# Patient Record
Sex: Male | Born: 1960 | Race: White | Hispanic: No | Marital: Married | Smoking: Former smoker
Health system: Southern US, Community
[De-identification: ages and names within clinical notes are randomized; demographics above are authoritative.]

## PROBLEM LIST (undated history)

## (undated) DIAGNOSIS — E7800 Pure hypercholesterolemia, unspecified: Secondary | ICD-10-CM

## (undated) HISTORY — PX: NO PAST SURGERIES: SHX2092

---

## 2013-05-21 ENCOUNTER — Ambulatory Visit: Payer: Self-pay | Admitting: Physician Assistant

## 2013-05-21 LAB — RAPID INFLUENZA A&B ANTIGENS

## 2013-07-19 LAB — LIPID PANEL
Cholesterol: 249 mg/dL — AB (ref 0–200)
HDL: 41 mg/dL (ref 35–70)
LDL Cholesterol: 141 mg/dL
Triglycerides: 334 mg/dL — AB (ref 40–160)

## 2013-07-19 LAB — HEPATIC FUNCTION PANEL
ALT: 33 U/L (ref 10–40)
AST: 22 U/L (ref 14–40)

## 2013-07-19 LAB — PSA: PSA: 0.9

## 2013-07-19 LAB — CBC AND DIFFERENTIAL: HEMOGLOBIN: 15.4 g/dL (ref 13.5–17.5)

## 2013-07-19 LAB — BASIC METABOLIC PANEL
BUN: 16 mg/dL (ref 4–21)
Creatinine: 0.8 mg/dL (ref 0.6–1.3)

## 2013-10-02 LAB — HM COLONOSCOPY: HM Colonoscopy: 5

## 2015-02-19 ENCOUNTER — Encounter: Payer: Self-pay | Admitting: Internal Medicine

## 2015-02-19 ENCOUNTER — Other Ambulatory Visit: Payer: Self-pay | Admitting: Internal Medicine

## 2015-02-19 DIAGNOSIS — Z8379 Family history of other diseases of the digestive system: Secondary | ICD-10-CM | POA: Insufficient documentation

## 2015-02-19 DIAGNOSIS — F3341 Major depressive disorder, recurrent, in partial remission: Secondary | ICD-10-CM | POA: Insufficient documentation

## 2015-02-19 DIAGNOSIS — F17201 Nicotine dependence, unspecified, in remission: Secondary | ICD-10-CM | POA: Insufficient documentation

## 2015-03-10 ENCOUNTER — Ambulatory Visit (INDEPENDENT_AMBULATORY_CARE_PROVIDER_SITE_OTHER): Payer: BLUE CROSS/BLUE SHIELD | Admitting: Internal Medicine

## 2015-03-10 ENCOUNTER — Encounter: Payer: Self-pay | Admitting: Internal Medicine

## 2015-03-10 VITALS — BP 136/90 | HR 80 | Ht 71.0 in | Wt 313.8 lb

## 2015-03-10 DIAGNOSIS — E785 Hyperlipidemia, unspecified: Secondary | ICD-10-CM

## 2015-03-10 DIAGNOSIS — R030 Elevated blood-pressure reading, without diagnosis of hypertension: Secondary | ICD-10-CM

## 2015-03-10 DIAGNOSIS — G4733 Obstructive sleep apnea (adult) (pediatric): Secondary | ICD-10-CM | POA: Insufficient documentation

## 2015-03-10 DIAGNOSIS — F3341 Major depressive disorder, recurrent, in partial remission: Secondary | ICD-10-CM

## 2015-03-10 DIAGNOSIS — R635 Abnormal weight gain: Secondary | ICD-10-CM | POA: Diagnosis not present

## 2015-03-10 DIAGNOSIS — K219 Gastro-esophageal reflux disease without esophagitis: Secondary | ICD-10-CM

## 2015-03-10 DIAGNOSIS — T50905A Adverse effect of unspecified drugs, medicaments and biological substances, initial encounter: Secondary | ICD-10-CM

## 2015-03-10 NOTE — Progress Notes (Signed)
Date:  03/10/2015   Name:  Anthony Cohen   DOB:  [birth year]   MRN:  [id number]   Chief Complaint: Depression and Gastroesophageal Reflux Depression        This is a chronic problem.The problem is unchanged.  Past treatments include SSRIs - Selective serotonin reuptake inhibitors (abilify and bupropion).  Compliance with treatment is good.  Past compliance problems: followed by psychiatry.  Previous treatment provided significant relief. Gastroesophageal Reflux He complains of heartburn. He reports no abdominal pain, no chest pain, no hoarse voice, no nausea or no wheezing. This is a recurrent problem. The problem occurs occasionally. The problem has been waxing and waning. He has tried a histamine-2 antagonist for the symptoms. The treatment provided significant relief.  OSA - recent sleep study showed AHI 107 with severe snoring.  He was started on CPAP but removes the mask after only a few minutes.  He is wondering if he is a candidate for surgery to improve his apnea. Obesity - he continues to gain weight.  Last weight one year ago was 197 lbs.  Today he is up 16 lbs to 313 lbs.  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 He has labs needed for Psychiatry due to chronic medication use.  Several of these cause weight gain and have been linked to DM.    Review of Systems  Constitutional: Positive for unexpected weight change. Negative for chills and diaphoresis.  HENT: Negative for hearing loss and hoarse voice.   Eyes: Negative for visual disturbance.  Respiratory: Negative for chest tightness, shortness of breath and wheezing.   Cardiovascular: Negative for chest pain, palpitations and leg swelling.  Gastrointestinal: Positive for heartburn. Negative for nausea and abdominal pain.  Musculoskeletal: Positive for back pain. Negative for arthralgias and gait problem.  Psychiatric/Behavioral: Positive for depression.    Patient Active Problem List   Diagnosis Date Noted  .  Recurrent major depressive disorder, in partial remission ([place]) 02/19/2015  . Family history of digestive disorder 02/19/2015  . Compulsive tobacco user syndrome 02/19/2015    Prior to Admission medications   Medication Sig Start Date End Date Taking? Authorizing Provider  ARIPiprazole (ABILIFY) 10 MG tablet Take 1 tablet by mouth daily.   Yes Historical Provider, MD  buPROPion (WELLBUTRIN XL) 150 MG 24 hr tablet Take 3 tablets by mouth daily.   Yes Historical Provider, MD  famotidine (PEPCID) 10 MG tablet Take 1 tablet by mouth daily.   Yes Historical Provider, MD  sertraline (ZOLOFT) 100 MG tablet Take 1 tablet by mouth daily.   Yes Historical Provider, MD    No Known Allergies  No past surgical history on file.  Social History  Substance Use Topics  . Smoking status: Current Every Day Smoker  . Smokeless tobacco: None  . Alcohol Use: 1.2 oz/week    2 Standard drinks or equivalent per week    Medication list has been reviewed and updated.  Physical Exam  Constitutional: He is oriented to person, place, and time. He appears well-developed and well-nourished.  Neck: Normal range of motion. Neck supple. Carotid bruit is not present. No thyromegaly present.  Cardiovascular: Normal rate, regular rhythm and normal heart sounds.   Pulmonary/Chest: Effort normal and breath sounds normal. No respiratory distress. He has no wheezes.  Musculoskeletal: He exhibits no edema.  Neurological: He is alert and oriented to person, place, and time.  Psychiatric: He has a normal mood and affect. His behavior is normal.  Nursing note and vitals reviewed.   BP 142/80 mmHg  Pulse 80  Ht 5\' 11"  (1.803 m)  Wt 313 lb 12.8 oz (142.339 kg)  BMI 43.79 kg/m2  Assessment and Plan: 1. Gastroesophageal reflux disease, esophagitis presence not specified Continue pepcid - CBC with Differential/Platelet  2. Recurrent major depressive disorder, in partial remission ([place]) Followed by Psychiatry -  TSH  3. OSA (obstructive sleep apnea) Refer to ENT for evaluation of possible surgical intervention - EKG 12-Lead - SR @ 88 with nonspecific Twave abnormality  4. Weight gain due to medication Recommend dietary changes and regular exercise for weight loss  - Comprehensive metabolic panel - Hemoglobin A1c  5. Hyperlipidemia May need medication; will advise after labs return - Lipid panel  6. Elevated blood pressure reading Will monitor -    Halina Maidens, MD [facility]  03/10/2015

## 2015-03-11 LAB — CBC WITH DIFFERENTIAL/PLATELET
BASOS ABS: 0 10*3/uL (ref 0.0–0.2)
Basos: 0 %
EOS (ABSOLUTE): 0.2 10*3/uL (ref 0.0–0.4)
Eos: 2 %
HEMOGLOBIN: 15.7 g/dL (ref 12.6–17.7)
Hematocrit: 47.4 % (ref 37.5–51.0)
Immature Grans (Abs): 0 10*3/uL (ref 0.0–0.1)
Immature Granulocytes: 0 %
LYMPHS: 29 %
Lymphocytes Absolute: 2.1 10*3/uL (ref 0.7–3.1)
MCH: 31 pg (ref 26.6–33.0)
MCHC: 33.1 g/dL (ref 31.5–35.7)
MCV: 94 fL (ref 79–97)
MONOCYTES: 7 %
Monocytes Absolute: 0.5 10*3/uL (ref 0.1–0.9)
NEUTROS ABS: 4.4 10*3/uL (ref 1.4–7.0)
NEUTROS PCT: 62 %
PLATELETS: 229 10*3/uL (ref 150–379)
RBC: 5.07 x10E6/uL (ref 4.14–5.80)
RDW: 14.1 % (ref 12.3–15.4)
WBC: 7.3 10*3/uL (ref 3.4–10.8)

## 2015-03-11 LAB — LIPID PANEL
CHOLESTEROL TOTAL: 272 mg/dL — AB (ref 100–199)
Chol/HDL Ratio: 9.1 ratio units — ABNORMAL HIGH (ref 0.0–5.0)
HDL: 30 mg/dL — AB (ref >39)
Triglycerides: 672 mg/dL (ref 0–149)

## 2015-03-11 LAB — COMPREHENSIVE METABOLIC PANEL
ALBUMIN: 4.6 g/dL (ref 3.5–5.5)
ALK PHOS: 66 IU/L (ref 39–117)
ALT: 38 IU/L (ref 0–44)
AST: 22 IU/L (ref 0–40)
Albumin/Globulin Ratio: 1.8 (ref 1.1–2.5)
BILIRUBIN TOTAL: 0.2 mg/dL (ref 0.0–1.2)
BUN/Creatinine Ratio: 20 (ref 9–20)
BUN: 17 mg/dL (ref 6–24)
CHLORIDE: 104 mmol/L (ref 96–106)
CO2: 24 mmol/L (ref 18–29)
CREATININE: 0.85 mg/dL (ref 0.76–1.27)
Calcium: 9.4 mg/dL (ref 8.7–10.2)
GFR calc non Af Amer: 99 mL/min/{1.73_m2} (ref >59)
GFR, EST AFRICAN AMERICAN: 114 mL/min/{1.73_m2} (ref >59)
GLUCOSE: 93 mg/dL (ref 65–99)
Globulin, Total: 2.6 g/dL (ref 1.5–4.5)
Potassium: 4.5 mmol/L (ref 3.5–5.2)
Sodium: 146 mmol/L — ABNORMAL HIGH (ref 134–144)
TOTAL PROTEIN: 7.2 g/dL (ref 6.0–8.5)

## 2015-03-11 LAB — HEMOGLOBIN A1C
Est. average glucose Bld gHb Est-mCnc: 143 mg/dL
Hgb A1c MFr Bld: 6.6 % — ABNORMAL HIGH (ref 4.8–5.6)

## 2015-03-11 LAB — TSH: TSH: 2.24 u[IU]/mL (ref 0.450–4.500)

## 2015-04-14 ENCOUNTER — Encounter: Payer: Self-pay | Admitting: Internal Medicine

## 2015-04-14 ENCOUNTER — Ambulatory Visit (INDEPENDENT_AMBULATORY_CARE_PROVIDER_SITE_OTHER): Payer: BLUE CROSS/BLUE SHIELD | Admitting: Internal Medicine

## 2015-04-14 VITALS — BP 122/82 | HR 112 | Temp 98.7°F | Resp 18 | Ht 71.0 in | Wt 306.0 lb

## 2015-04-14 DIAGNOSIS — J111 Influenza due to unidentified influenza virus with other respiratory manifestations: Secondary | ICD-10-CM | POA: Diagnosis not present

## 2015-04-14 LAB — POCT INFLUENZA A/B
Influenza A, POC: NEGATIVE
Influenza B, POC: NEGATIVE

## 2015-04-14 MED ORDER — OSELTAMIVIR PHOSPHATE 75 MG PO CAPS: 75.0000 mg | ORAL_CAPSULE | Freq: Two times a day (BID) | ORAL | Status: DC

## 2015-04-14 NOTE — Progress Notes (Signed)
Date:  04/14/2015   Name:  Anthony Cohen   DOB:  Oct 22, 1960   MRN:  [id number]   Chief Complaint: Cough Cough This is a chronic problem. The current episode started yesterday. The problem has been unchanged. The problem occurs every few minutes. The cough is non-productive. Associated symptoms include chills, a fever, headaches, myalgias, rhinorrhea and sweats. Pertinent negatives include no chest pain, ear pain, postnasal drip, rash, shortness of breath or wheezing. Nothing aggravates the symptoms. Treatments tried: advil.  His chills and body aches responded to ibuprofen. He did not have a flu shot this. 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  Review of Systems  Constitutional: Positive for fever and chills.  HENT: Positive for rhinorrhea. Negative for ear pain, postnasal drip and sinus pressure.   Eyes: Negative for visual disturbance.  Respiratory: Positive for cough. Negative for chest tightness, shortness of breath and wheezing.   Cardiovascular: Negative for chest pain and palpitations.  Gastrointestinal: Negative for nausea, vomiting and abdominal pain.  Musculoskeletal: Positive for myalgias.  Skin: Negative for rash.  Neurological: Positive for headaches. Negative for dizziness, syncope and numbness.    Patient Active Problem List   Diagnosis Date Noted  . OSA (obstructive sleep apnea) 03/10/2015  . Weight gain due to medication 03/10/2015  . Hyperlipidemia 03/10/2015  . Recurrent major depressive disorder, in partial remission ([place]) 02/19/2015  . Family history of digestive disorder 02/19/2015  . Compulsive tobacco user syndrome 02/19/2015    Prior to Admission medications   Medication Sig Start Date End Date Taking? Authorizing Provider  ARIPiprazole (ABILIFY) 10 MG tablet Take 1 tablet by mouth daily. 02/27/15  Yes Historical Provider, MD  buPROPion (WELLBUTRIN XL) 150 MG 24 hr tablet Take 3 tablets by mouth daily. 02/27/15  Yes Historical  Provider, MD  DULoxetine (CYMBALTA) 60 MG capsule Take 1 capsule by mouth daily. 04/11/15  Yes Historical Provider, MD    No Known Allergies  Past Surgical History  Procedure Laterality Date  . No past surgeries      Social History  Substance Use Topics  . Smoking status: Former Smoker    Start date: 03/29/2015  . Smokeless tobacco: None  . Alcohol Use: 0.0 oz/week    0 Standard drinks or equivalent per week     Medication list has been reviewed and updated.   Physical Exam  Constitutional: He is oriented to person, place, and time. He appears well-developed and well-nourished. He has a sickly appearance.  Neck: Normal range of motion. Neck supple.  Cardiovascular: Regular rhythm, normal heart sounds and normal pulses.   No extrasystoles are present. Tachycardia present.   Pulmonary/Chest: Effort normal and breath sounds normal. He has no wheezes. He has no rales.  Lymphadenopathy:    He has no cervical adenopathy.  Neurological: He is alert and oriented to person, place, and time.  Skin: Skin is warm. He is diaphoretic.  Nursing note and vitals reviewed.   BP 122/82 mmHg  Pulse 112  Temp(Src) 98.7 F (37.1 C) (Oral)  Resp 18  Ht 5\' 11"  (1.803 m)  Wt 306 lb (138.801 kg)  BMI 42.70 kg/m2  SpO2 98%  Assessment and Plan: 1. Influenza Continue Advil 400-600 mg tid Fluids; remain out of work until 04/07/15 or until fever resolved - oseltamivir (TAMIFLU) 75 MG capsule; Take 1 capsule (75 mg total) by mouth 2 (two) times daily.  Dispense: 10 capsule; Refill: 0 - POCT Influenza A/B   Halina Maidens,  MD [facility]  04/14/2015

## 2015-06-09 ENCOUNTER — Ambulatory Visit: Payer: BLUE CROSS/BLUE SHIELD | Admitting: Internal Medicine

## 2015-06-16 ENCOUNTER — Ambulatory Visit (INDEPENDENT_AMBULATORY_CARE_PROVIDER_SITE_OTHER): Payer: BLUE CROSS/BLUE SHIELD | Admitting: Internal Medicine

## 2015-06-16 ENCOUNTER — Encounter: Payer: Self-pay | Admitting: Internal Medicine

## 2015-06-16 VITALS — BP 122/80 | HR 80 | Ht 71.0 in | Wt 311.6 lb

## 2015-06-16 DIAGNOSIS — F17201 Nicotine dependence, unspecified, in remission: Secondary | ICD-10-CM

## 2015-06-16 DIAGNOSIS — E119 Type 2 diabetes mellitus without complications: Secondary | ICD-10-CM | POA: Diagnosis not present

## 2015-06-16 DIAGNOSIS — E1165 Type 2 diabetes mellitus with hyperglycemia: Secondary | ICD-10-CM

## 2015-06-16 DIAGNOSIS — R7303 Prediabetes: Secondary | ICD-10-CM | POA: Insufficient documentation

## 2015-06-16 DIAGNOSIS — K219 Gastro-esophageal reflux disease without esophagitis: Secondary | ICD-10-CM | POA: Diagnosis not present

## 2015-06-16 DIAGNOSIS — E118 Type 2 diabetes mellitus with unspecified complications: Secondary | ICD-10-CM | POA: Insufficient documentation

## 2015-06-16 DIAGNOSIS — E785 Hyperlipidemia, unspecified: Secondary | ICD-10-CM

## 2015-06-16 DIAGNOSIS — E1169 Type 2 diabetes mellitus with other specified complication: Secondary | ICD-10-CM | POA: Insufficient documentation

## 2015-06-16 DIAGNOSIS — E782 Mixed hyperlipidemia: Secondary | ICD-10-CM | POA: Insufficient documentation

## 2015-06-16 MED ORDER — ICOSAPENT ETHYL 1 G PO CAPS: 2.0000 | ORAL_CAPSULE | Freq: Two times a day (BID) | ORAL | Status: DC

## 2015-06-16 NOTE — Progress Notes (Signed)
Date:  06/16/2015   Name:  Anthony Cohen   DOB:  [birth year]   MRN:  [id number]   Chief Complaint: Follow-up; Gastroesophageal Reflux; Diabetes; and Hyperlipidemia Gastroesophageal Reflux He complains of heartburn. He reports no abdominal pain, no chest pain, no coughing, no hoarse voice, no sore throat, no water brash or no wheezing. This is a recurrent problem. The problem occurs occasionally. The problem has been rapidly improving. The heartburn duration is several minutes. Pertinent negatives include no fatigue. He has tried a histamine-2 antagonist for the symptoms.  Diabetes He presents for his initial diabetic visit. He has type 2 diabetes mellitus. The initial diagnosis of diabetes was made 4 months ago. Pertinent negatives for hypoglycemia include no dizziness or headaches. Pertinent negatives for diabetes include no blurred vision, no chest pain, no fatigue, no foot paresthesias, no foot ulcerations and no visual change. Diabetic symptom progression: his A1C 6.6 - he has avoided sweets but eats other carbs.    Hyperlipidemia This is a chronic problem. Recent lipid tests were reviewed and are high. Pertinent negatives include no chest pain. He is currently on no antihyperlipidemic treatment (he had elevated LFTs on lipitor about 10 yr ago).  Tobacco use - he recently quit smoking using nicotine patches.  He is very proud of his quitting but he has been snacking more and gained a few pounds.  Lab Results  Component Value Date   HGBA1C 6.6* 03/10/2015   Lab Results  Component Value Date   CHOL 272* 03/10/2015   HDL 30* 03/10/2015   LDLCALC Comment 03/10/2015   TRIG 672* 03/10/2015   CHOLHDL 9.1* 03/10/2015     Review of Systems  Constitutional: Negative for chills and fatigue.  HENT: Negative for hearing loss, hoarse voice and sore throat.   Eyes: Negative for blurred vision and visual disturbance.  Respiratory: Negative for cough, chest tightness and wheezing.     Cardiovascular: Negative for chest pain, palpitations and leg swelling.  Gastrointestinal: Positive for heartburn. Negative for abdominal pain.  Musculoskeletal: Negative for arthralgias.  Neurological: Negative for dizziness, syncope and headaches.    Patient Active Problem List   Diagnosis Date Noted  . Diabetes mellitus type 2, controlled ([place]) 06/16/2015  . OSA (obstructive sleep apnea) 03/10/2015  . Weight gain due to medication 03/10/2015  . Hyperlipidemia 03/10/2015  . Recurrent major depressive disorder, in partial remission ([place]) 02/19/2015  . Family history of digestive disorder 02/19/2015  . Compulsive tobacco user syndrome 02/19/2015    Prior to Admission medications   Medication Sig Start Date End Date Taking? Authorizing Provider  ARIPiprazole (ABILIFY) 10 MG tablet Take 1 tablet by mouth daily. 02/27/15  Yes Historical Provider, MD  buPROPion (WELLBUTRIN XL) 150 MG 24 hr tablet Take 3 tablets by mouth daily. 02/27/15  Yes Historical Provider, MD  DULoxetine (CYMBALTA) 60 MG capsule Take 1 capsule by mouth daily. 04/11/15  Yes Historical Provider, MD    No Known Allergies  Past Surgical History  Procedure Laterality Date  . No past surgeries      Social History  Substance Use Topics  . Smoking status: Former Smoker    Start date: 03/29/2015  . Smokeless tobacco: None  . Alcohol Use: 0.0 oz/week    0 Standard drinks or equivalent per week     Medication list has been reviewed and updated.   Physical Exam  Constitutional: He is oriented to person, place, and time. He appears well-developed and well-nourished. No distress.  HENT:  Head: Normocephalic and atraumatic.  Neck: Normal range of motion. No thyromegaly present.  Cardiovascular: Normal rate, regular rhythm and normal heart sounds.   Pulmonary/Chest: Effort normal and breath sounds normal. No respiratory distress. He has no wheezes.  Abdominal: Soft.  Musculoskeletal: Normal range of motion. He  exhibits no edema or tenderness.  Lymphadenopathy:    He has no cervical adenopathy.  Neurological: He is alert and oriented to person, place, and time. He has normal reflexes.  Skin: Skin is warm and dry. No rash noted.  Psychiatric: He has a normal mood and affect. His speech is normal and behavior is normal. Thought content normal.  Nursing note and vitals reviewed.   BP 122/80 mmHg  Pulse 80  Ht 5\' 11"  (1.803 m)  Wt 311 lb 9.6 oz (141.341 kg)  BMI 43.48 kg/m2  Assessment and Plan: 1. Controlled type 2 diabetes mellitus without complication, without long-term current use of insulin ([place]) Discussed reduced carb diet and will refer for DM education before starting medication - Ambulatory referral to diabetic education - Hemoglobin A1c  2. Hyperlipidemia Very high triglycerides obscuring LDL/HDL Intolerant of atorvastatin in the past - samples plus Rx given for Vascepa - Icosapent Ethyl (VASCEPA) 1 g CAPS; Take 2 capsules by mouth 2 (two) times daily.  Dispense: 120 capsule; Refill: 5  3. Gastroesophageal reflux disease, esophagitis presence not specified Continue PRN Pepcid  4. Tobacco use disorder, moderate, in early remission, dependence Patient is congratulated on quitting   Halina Maidens, MD [facility]  06/16/2015

## 2015-06-17 LAB — HEMOGLOBIN A1C
ESTIMATED AVERAGE GLUCOSE: 140 mg/dL
HEMOGLOBIN A1C: 6.5 % — AB (ref 4.8–5.6)

## 2015-06-18 ENCOUNTER — Telehealth: Payer: Self-pay

## 2015-06-18 NOTE — Telephone Encounter (Signed)
-----   Message from Glean Hess, MD sent at 06/17/2015 12:07 PM EDT ----- A1C is just slightly better.  Proceed with diabetes education classes and work on cutting back on carbohydrates.

## 2015-06-18 NOTE — Telephone Encounter (Signed)
Left message for patient to call back  

## 2015-06-25 NOTE — Telephone Encounter (Signed)
Left message for patient to call back  

## 2015-07-08 NOTE — Telephone Encounter (Signed)
Left message for patient to call back  

## 2015-10-17 ENCOUNTER — Ambulatory Visit (INDEPENDENT_AMBULATORY_CARE_PROVIDER_SITE_OTHER): Payer: BLUE CROSS/BLUE SHIELD | Admitting: Internal Medicine

## 2015-10-17 ENCOUNTER — Other Ambulatory Visit: Payer: Self-pay | Admitting: Internal Medicine

## 2015-10-17 ENCOUNTER — Encounter: Payer: Self-pay | Admitting: Internal Medicine

## 2015-10-17 VITALS — BP 138/82 | HR 84 | Resp 16 | Ht 71.0 in | Wt 320.0 lb

## 2015-10-17 DIAGNOSIS — E119 Type 2 diabetes mellitus without complications: Secondary | ICD-10-CM | POA: Diagnosis not present

## 2015-10-17 DIAGNOSIS — E785 Hyperlipidemia, unspecified: Secondary | ICD-10-CM | POA: Diagnosis not present

## 2015-10-17 NOTE — Progress Notes (Signed)
Date:  10/17/2015   Name:  Anthony Cohen   DOB:  29-Aug-1960   MRN:  [id number]   Chief Complaint: Diabetes and Hyperlipidemia Diabetes  He presents for his follow-up diabetic visit. He has type 2 diabetes mellitus. His disease course has been stable. There are no hypoglycemic associated symptoms. Pertinent negatives for hypoglycemia include no dizziness or headaches. Pertinent negatives for diabetes include no blurred vision, no chest pain, no fatigue, no foot paresthesias, no polydipsia and no polyphagia. Symptoms are stable. Current diabetic treatment includes diet. He is compliant with treatment most of the time.  Hyperlipidemia  This is a chronic problem. The current episode started more than 1 year ago. Exacerbating diseases include diabetes. Pertinent negatives include no chest pain or shortness of breath. Treatments tried: EPA not tolerated.      Review of Systems  Constitutional: Negative for chills, fatigue and fever.  HENT: Negative for hearing loss, tinnitus, trouble swallowing and voice change.   Eyes: Negative for blurred vision and visual disturbance.  Respiratory: Negative for cough, chest tightness, shortness of breath and wheezing.   Cardiovascular: Positive for leg swelling. Negative for chest pain and palpitations.  Endocrine: Negative for polydipsia and polyphagia.  Musculoskeletal: Positive for arthralgias, gait problem and joint swelling.  Neurological: Negative for dizziness and headaches.  Psychiatric/Behavioral: Negative for sleep disturbance.    Patient Active Problem List   Diagnosis Date Noted  . Diabetes mellitus type 2, controlled ([place]) 06/16/2015  . OSA (obstructive sleep apnea) 03/10/2015  . Weight gain due to medication 03/10/2015  . Hyperlipidemia 03/10/2015  . Recurrent major depressive disorder, in partial remission ([place]) 02/19/2015  . Family history of digestive disorder 02/19/2015  . Tobacco use disorder, moderate, in early remission,  dependence 02/19/2015    Prior to Admission medications   Medication Sig Start Date End Date Taking? Authorizing Provider  ARIPiprazole (ABILIFY) 10 MG tablet Take 1 tablet by mouth daily. 02/27/15  Yes Historical Provider, MD  buPROPion (WELLBUTRIN XL) 150 MG 24 hr tablet Take 3 tablets by mouth daily. 02/27/15  Yes Historical Provider, MD  DULoxetine (CYMBALTA) 60 MG capsule Take 1 capsule by mouth daily. 04/11/15  Yes Historical Provider, MD    Allergies  Allergen Reactions  . Atorvastatin Other (See Comments)    Transient elevation in LFTs ~2007    Past Surgical History:  Procedure Laterality Date  . NO PAST SURGERIES      Social History  Substance Use Topics  . Smoking status: Former Smoker    Packs/day: 1.00    Years: 3.00    Types: Cigarettes    Start date: 03/29/2015    Quit date: 03/29/2015  . Smokeless tobacco: Never Used  . Alcohol use 0.0 oz/week     Medication list has been reviewed and updated.   Physical Exam  Constitutional: He is oriented to person, place, and time. He appears well-developed. No distress.  HENT:  Head: Normocephalic and atraumatic.  Neck: Normal range of motion. Neck supple. Carotid bruit is not present.  Cardiovascular: Normal rate, regular rhythm and normal heart sounds.   Pulmonary/Chest: Effort normal and breath sounds normal. No respiratory distress. He has no wheezes.  Abdominal: Soft.  Musculoskeletal: He exhibits edema (trace ankle). He exhibits no tenderness.  Neurological: He is alert and oriented to person, place, and time.  Skin: Skin is warm and dry. No rash noted.  Psychiatric: He has a normal mood and affect. His behavior is normal. Thought content normal.  Nursing  note and vitals reviewed.   BP 138/82 (BP Location: Right Arm, Patient Position: Sitting, Cuff Size: Large)   Pulse 84   Resp 16   Ht 5\' 11"  (1.803 m)   Wt (!) 320 lb (145.2 kg)   BMI 44.63 kg/m   Assessment and Plan: 1. Controlled type 2 diabetes  mellitus without complication, without long-term current use of insulin ([facility]) Continue diet and efforts at weight loss - Hemoglobin A1c - Comprehensive metabolic panel  2. Hyperlipidemia Trial of Welchol given - call for Rx if tolerated - Comprehensive metabolic panel   Halina Maidens, MD [facility]  10/17/2015

## 2015-10-17 NOTE — Patient Instructions (Signed)
Begin Welchol 3 tablet twice a day.

## 2015-10-17 NOTE — Addendum Note (Signed)
Addended by: Glean Hess on: 10/17/2015 04:11 PM   Modules accepted: Orders

## 2015-10-18 LAB — COMPREHENSIVE METABOLIC PANEL
ALT: 71 IU/L — AB (ref 0–44)
AST: 44 IU/L — AB (ref 0–40)
Albumin/Globulin Ratio: 1.4 (ref 1.2–2.2)
Albumin: 4.3 g/dL (ref 3.5–5.5)
Alkaline Phosphatase: 68 IU/L (ref 39–117)
BILIRUBIN TOTAL: 0.3 mg/dL (ref 0.0–1.2)
BUN/Creatinine Ratio: 21 — ABNORMAL HIGH (ref 9–20)
BUN: 16 mg/dL (ref 6–24)
CALCIUM: 9.3 mg/dL (ref 8.7–10.2)
CO2: 24 mmol/L (ref 18–29)
CREATININE: 0.77 mg/dL (ref 0.76–1.27)
Chloride: 102 mmol/L (ref 96–106)
GFR calc Af Amer: 118 mL/min/{1.73_m2} (ref >59)
GFR, EST NON AFRICAN AMERICAN: 102 mL/min/{1.73_m2} (ref >59)
GLUCOSE: 97 mg/dL (ref 65–99)
Globulin, Total: 3.1 g/dL (ref 1.5–4.5)
Potassium: 4.5 mmol/L (ref 3.5–5.2)
Sodium: 142 mmol/L (ref 134–144)
TOTAL PROTEIN: 7.4 g/dL (ref 6.0–8.5)

## 2015-10-18 LAB — HEMOGLOBIN A1C
ESTIMATED AVERAGE GLUCOSE: 154 mg/dL
HEMOGLOBIN A1C: 7 % — AB (ref 4.8–5.6)

## 2015-10-20 LAB — MICROALBUMIN / CREATININE URINE RATIO
CREATININE, UR: 232.6 mg/dL
MICROALB/CREAT RATIO: 3 mg/g creat (ref 0.0–30.0)
MICROALBUM., U, RANDOM: 6.9 ug/mL

## 2016-04-20 ENCOUNTER — Ambulatory Visit (INDEPENDENT_AMBULATORY_CARE_PROVIDER_SITE_OTHER): Payer: BLUE CROSS/BLUE SHIELD | Admitting: Internal Medicine

## 2016-04-20 ENCOUNTER — Encounter: Payer: Self-pay | Admitting: Internal Medicine

## 2016-04-20 VITALS — BP 132/82 | HR 82 | Temp 98.0°F | Wt 318.0 lb

## 2016-04-20 DIAGNOSIS — E782 Mixed hyperlipidemia: Secondary | ICD-10-CM | POA: Diagnosis not present

## 2016-04-20 DIAGNOSIS — Z0000 Encounter for general adult medical examination without abnormal findings: Secondary | ICD-10-CM | POA: Diagnosis not present

## 2016-04-20 DIAGNOSIS — K219 Gastro-esophageal reflux disease without esophagitis: Secondary | ICD-10-CM | POA: Diagnosis not present

## 2016-04-20 DIAGNOSIS — F3341 Major depressive disorder, recurrent, in partial remission: Secondary | ICD-10-CM

## 2016-04-20 DIAGNOSIS — E119 Type 2 diabetes mellitus without complications: Secondary | ICD-10-CM

## 2016-04-20 DIAGNOSIS — Z125 Encounter for screening for malignant neoplasm of prostate: Secondary | ICD-10-CM

## 2016-04-20 LAB — POCT URINALYSIS DIPSTICK
Bilirubin, UA: NEGATIVE
GLUCOSE UA: NEGATIVE
Leukocytes, UA: NEGATIVE
Nitrite, UA: NEGATIVE
PROTEIN UA: NEGATIVE
RBC UA: NEGATIVE
UROBILINOGEN UA: 0.2
pH, UA: 5

## 2016-04-20 NOTE — Patient Instructions (Signed)
Mediterranean Diet A Mediterranean diet refers to food and lifestyle choices that are based on the traditions of countries located on the [place]. This way of eating has been shown to help prevent certain conditions and improve outcomes for people who have chronic diseases, like kidney disease and heart disease. What are tips for following this plan? Lifestyle  Cook and eat meals together with your family, when possible.  Drink enough fluid to keep your urine clear or pale yellow.  Be physically active every day. This includes:  Aerobic exercise like running or swimming.  Leisure activities like gardening, walking, or housework.  Get 7-8 hours of sleep each night.  If recommended by your health care provider, drink red wine in moderation. This means 1 glass a day for nonpregnant women and 2 glasses a day for men. A glass of wine equals 5 oz (150 mL). Reading food labels  Check the serving size of packaged foods. For foods such as rice and pasta, the serving size refers to the amount of cooked product, not dry.  Check the total fat in packaged foods. Avoid foods that have saturated fat or trans fats.  Check the ingredients list for added sugars, such as corn syrup. Shopping  At the grocery store, buy most of your food from the areas near the walls of the store. This includes:  Fresh fruits and vegetables (produce).  Grains, beans, nuts, and seeds. Some of these may be available in unpackaged forms or large amounts (in bulk).  Fresh seafood.  Poultry and eggs.  Low-fat dairy products.  Buy whole ingredients instead of prepackaged foods.  Buy fresh fruits and vegetables in-season from local farmers markets.  Buy frozen fruits and vegetables in resealable bags.  If you do not have access to quality fresh seafood, buy precooked frozen shrimp or canned fish, such as tuna, salmon, or sardines.  Buy small amounts of raw or cooked vegetables, salads, or olives from the  deli or salad bar at your store.  Stock your pantry so you always have certain foods on hand, such as olive oil, canned tuna, canned tomatoes, rice, pasta, and beans. Cooking  Cook foods with extra-virgin olive oil instead of using butter or other vegetable oils.  Have meat as a side dish, and have vegetables or grains as your main dish. This means having meat in small portions or adding small amounts of meat to foods like pasta or stew.  Use beans or vegetables instead of meat in common dishes like chili or lasagna.  Experiment with different cooking methods. Try roasting or broiling vegetables instead of steaming or sauteing them.  Add frozen vegetables to soups, stews, pasta, or rice.  Add nuts or seeds for added healthy fat at each meal. You can add these to yogurt, salads, or vegetable dishes.  Marinate fish or vegetables using olive oil, lemon juice, garlic, and fresh herbs. Meal planning  Plan to eat 1 vegetarian meal one day each week. Try to work up to 2 vegetarian meals, if possible.  Eat seafood 2 or more times a week.  Have healthy snacks readily available, such as:  Vegetable sticks with hummus.  Greek yogurt.  Fruit and nut trail mix.  Eat balanced meals throughout the week. This includes:  Fruit: 2-3 servings a day  Vegetables: 4-5 servings a day  Low-fat dairy: 2 servings a day  Fish, poultry, or lean meat: 1 serving a day  Beans and legumes: 2 or more servings a week  Nuts   and seeds: 1-2 servings a day  Whole grains: 6-8 servings a day  Extra-virgin olive oil: 3-4 servings a day  Limit red meat and sweets to only a few servings a month What are my food choices?  Mediterranean diet  Recommended  Grains: Whole-grain pasta. Brown rice. Bulgar wheat. Polenta. Couscous. Whole-wheat bread. Oatmeal. Quinoa.  Vegetables: Artichokes. Beets. Broccoli. Cabbage. Carrots. Eggplant. Green beans. Chard. Kale. Spinach. Onions. Leeks. Peas. Squash.  Tomatoes. Peppers. Radishes.  Fruits: Apples. Apricots. Avocado. Berries. Bananas. Cherries. Dates. Figs. Grapes. Lemons. Melon. Oranges. Peaches. Plums. Pomegranate.  Meats and other protein foods: Beans. Almonds. Sunflower seeds. Pine nuts. Peanuts. Cod. Salmon. Scallops. Shrimp. Tuna. Tilapia. Clams. Oysters. Eggs.  Dairy: Low-fat milk. Cheese. Greek yogurt.  Beverages: Water. Red wine. Herbal tea.  Fats and oils: Extra virgin olive oil. Avocado oil. Grape seed oil.  Sweets and desserts: Greek yogurt with honey. Baked apples. Poached pears. Trail mix.  Seasoning and other foods: Basil. Cilantro. Coriander. Cumin. Mint. Parsley. Sage. Rosemary. Tarragon. Garlic. Oregano. Thyme. Pepper. Balsalmic vinegar. Tahini. Hummus. Tomato sauce. Olives. Mushrooms.  Limit these  Grains: Prepackaged pasta or rice dishes. Prepackaged cereal with added sugar.  Vegetables: Deep fried potatoes (french fries).  Fruits: Fruit canned in syrup.  Meats and other protein foods: Beef. Pork. Lamb. Poultry with skin. Hot dogs. Bacon.  Dairy: Ice cream. Sour cream. Whole milk.  Beverages: Juice. Sugar-sweetened soft drinks. Beer. Liquor and spirits.  Fats and oils: Butter. Canola oil. Vegetable oil. Beef fat (tallow). Lard.  Sweets and desserts: Cookies. Cakes. Pies. Candy.  Seasoning and other foods: Mayonnaise. Premade sauces and marinades.  The items listed may not be a complete list. Talk with your dietitian about what dietary choices are right for you. Summary  The Mediterranean diet includes both food and lifestyle choices.  Eat a variety of fresh fruits and vegetables, beans, nuts, seeds, and whole grains.  Limit the amount of red meat and sweets that you eat.  Talk with your health care provider about whether it is safe for you to drink red wine in moderation. This means 1 glass a day for nonpregnant women and 2 glasses a day for men. A glass of wine equals 5 oz (150 mL). This information  is not intended to replace advice given to you by your health care provider. Make sure you discuss any questions you have with your health care provider. Document Released: 10/30/2015 Document Revised: 12/02/2015 Document Reviewed: 10/30/2015 Elsevier Interactive Patient Education  2017 [employer]  

## 2016-04-20 NOTE — Progress Notes (Signed)
Date:  04/20/2016   Name:  Anthony Cohen   DOB:  16-Jun-1960   MRN:  [id number]   Chief Complaint: Annual Exam; Diabetes; and Hyperlipidemia Anthony Cohen is a 56 y.o. male who presents today for his Complete Annual Exam. He feels well. He reports exercising at home 4 times a week. He reports he is sleeping well.   Diabetes  He presents for his follow-up diabetic visit. His disease course has been stable. Pertinent negatives for hypoglycemia include no dizziness or headaches. Pertinent negatives for diabetes include no chest pain and no fatigue. He is following a generally healthy diet. He participates in exercise three times a week. An ACE inhibitor/angiotensin II receptor blocker is not being taken. Eye exam is not current.  Hyperlipidemia  This is a chronic problem. The problem is uncontrolled. Exacerbating diseases include diabetes. Pertinent negatives include no chest pain, myalgias or shortness of breath. Current antihyperlipidemic treatment includes diet change.  Gastroesophageal Reflux  He complains of heartburn. He reports no abdominal pain, no chest pain, no choking or no wheezing. This is a recurrent problem. The problem occurs occasionally. Pertinent negatives include no fatigue. He has tried a PPI for the symptoms. The treatment provided significant relief.   Lab Results  Component Value Date   HGBA1C 7.0 (H) 10/17/2015      Review of Systems  Constitutional: Negative for appetite change, chills, diaphoresis, fatigue and unexpected weight change.  HENT: Negative for hearing loss, tinnitus, trouble swallowing and voice change.   Eyes: Negative for visual disturbance.  Respiratory: Negative for choking, shortness of breath and wheezing.   Cardiovascular: Negative for chest pain, palpitations and leg swelling.  Gastrointestinal: Positive for heartburn. Negative for abdominal pain, blood in stool, constipation and diarrhea.  Genitourinary: Negative for difficulty  urinating, dysuria and frequency.  Musculoskeletal: Negative for arthralgias, back pain and myalgias.  Skin: Negative for color change and rash.  Neurological: Negative for dizziness, syncope and headaches.  Hematological: Negative for adenopathy.  Psychiatric/Behavioral: Negative for dysphoric mood and sleep disturbance.    Patient Active Problem List   Diagnosis Date Noted  . Controlled type 2 diabetes mellitus without complication, without long-term current use of insulin ([place]) 06/16/2015  . OSA (obstructive sleep apnea) 03/10/2015  . Weight gain due to medication 03/10/2015  . Hyperlipidemia 03/10/2015  . Recurrent major depressive disorder, in partial remission ([place]) 02/19/2015  . Family history of digestive disorder 02/19/2015  . Tobacco use disorder, moderate, in early remission, dependence 02/19/2015    Prior to Admission medications   Medication Sig Start Date End Date Taking? Authorizing Provider  ARIPiprazole (ABILIFY) 10 MG tablet Take 1 tablet by mouth daily. 02/27/15  Yes Historical Provider, MD  buPROPion (WELLBUTRIN XL) 150 MG 24 hr tablet Take 3 tablets by mouth daily. 02/27/15  Yes Historical Provider, MD  DULoxetine (CYMBALTA) 60 MG capsule Take 1 capsule by mouth daily. 04/11/15  Yes Historical Provider, MD    Allergies  Allergen Reactions  . Atorvastatin Other (See Comments)    Transient elevation in LFTs ~2007  . Vascepa [Epa Ethyl Ester] Nausea Only    Past Surgical History:  Procedure Laterality Date  . NO PAST SURGERIES      Social History  Substance Use Topics  . Smoking status: Former Smoker    Packs/day: 1.00    Years: 3.00    Types: Cigarettes    Start date: 03/29/2015    Quit date: 03/29/2015  . Smokeless tobacco: Never Used  .  Alcohol use 0.0 oz/week     Medication list has been reviewed and updated.   Physical Exam  Constitutional: He is oriented to person, place, and time. He appears well-developed and well-nourished.  HENT:  Head:  Normocephalic.  Right Ear: Tympanic membrane, external ear and ear canal normal.  Left Ear: Tympanic membrane, external ear and ear canal normal.  Nose: Nose normal.  Mouth/Throat: Uvula is midline and oropharynx is clear and moist.  Eyes: Conjunctivae and EOM are normal. Pupils are equal, round, and reactive to light.  Neck: Normal range of motion. Neck supple. Carotid bruit is not present. No thyromegaly present.  Cardiovascular: Normal rate, regular rhythm, normal heart sounds and intact distal pulses.   Pulmonary/Chest: Effort normal and breath sounds normal. He has no wheezes. Right breast exhibits no mass and no tenderness. Left breast exhibits no mass and no tenderness.  Abdominal: Soft. Normal appearance and bowel sounds are normal. There is no hepatosplenomegaly. There is no tenderness.  Musculoskeletal: Normal range of motion.  Lymphadenopathy:    He has no cervical adenopathy.  Neurological: He is alert and oriented to person, place, and time. He has normal reflexes.  Skin: Skin is warm, dry and intact.  Scattered SK and AK plus numerous nevi  Psychiatric: He has a normal mood and affect. His speech is normal and behavior is normal. Judgment and thought content normal.  Nursing note and vitals reviewed.   BP 132/82   Pulse 82   Temp 98 F (36.7 C)   Wt (!) 318 lb (144.2 kg)   SpO2 92%   BMI 44.35 kg/m   Assessment and Plan: 1. Annual physical exam Normal exam except for weight Continue regular exercise, limit portions Schedule annual eye exam Recommend Dermatology skin survey  2. Prostate cancer screening DRE deferred - PSA  3. Controlled type 2 diabetes mellitus without complication, without long-term current use of insulin (HCC) Continue diet and exercise - CBC with Differential/Platelet - Comprehensive metabolic panel - Hemoglobin A1c - POCT urinalysis dipstick  4. Mixed hyperlipidemia Consider pravachol if needed - Lipid panel  5. Recurrent major  depressive disorder, in partial remission ([place]) Doing well on medications and psych follow up  6. GERD without esophagitis Continue otc pepcid as needed   Halina Maidens, MD [facility]  04/20/2016

## 2016-04-21 ENCOUNTER — Other Ambulatory Visit: Payer: Self-pay | Admitting: Internal Medicine

## 2016-04-21 LAB — CBC WITH DIFFERENTIAL/PLATELET
BASOS: 1 %
Basophils Absolute: 0 10*3/uL (ref 0.0–0.2)
EOS (ABSOLUTE): 0.1 10*3/uL (ref 0.0–0.4)
Eos: 2 %
HEMOGLOBIN: 15.4 g/dL (ref 13.0–17.7)
Hematocrit: 45.3 % (ref 37.5–51.0)
IMMATURE GRANS (ABS): 0 10*3/uL (ref 0.0–0.1)
IMMATURE GRANULOCYTES: 0 %
LYMPHS: 36 %
Lymphocytes Absolute: 2.2 10*3/uL (ref 0.7–3.1)
MCH: 31.2 pg (ref 26.6–33.0)
MCHC: 34 g/dL (ref 31.5–35.7)
MCV: 92 fL (ref 79–97)
MONOCYTES: 7 %
Monocytes Absolute: 0.4 10*3/uL (ref 0.1–0.9)
NEUTROS ABS: 3.3 10*3/uL (ref 1.4–7.0)
NEUTROS PCT: 54 %
PLATELETS: 237 10*3/uL (ref 150–379)
RBC: 4.93 x10E6/uL (ref 4.14–5.80)
RDW: 13.8 % (ref 12.3–15.4)
WBC: 6.1 10*3/uL (ref 3.4–10.8)

## 2016-04-21 LAB — PSA: PROSTATE SPECIFIC AG, SERUM: 0.2 ng/mL (ref 0.0–4.0)

## 2016-04-21 LAB — COMPREHENSIVE METABOLIC PANEL
A/G RATIO: 1.4 (ref 1.2–2.2)
ALBUMIN: 4.2 g/dL (ref 3.5–5.5)
ALT: 57 IU/L — AB (ref 0–44)
AST: 34 IU/L (ref 0–40)
Alkaline Phosphatase: 80 IU/L (ref 39–117)
BILIRUBIN TOTAL: 0.5 mg/dL (ref 0.0–1.2)
BUN / CREAT RATIO: 16 (ref 9–20)
BUN: 15 mg/dL (ref 6–24)
CALCIUM: 9.1 mg/dL (ref 8.7–10.2)
CHLORIDE: 101 mmol/L (ref 96–106)
CO2: 25 mmol/L (ref 18–29)
Creatinine, Ser: 0.93 mg/dL (ref 0.76–1.27)
GFR, EST AFRICAN AMERICAN: 106 mL/min/{1.73_m2} (ref >59)
GFR, EST NON AFRICAN AMERICAN: 92 mL/min/{1.73_m2} (ref >59)
GLUCOSE: 114 mg/dL — AB (ref 65–99)
Globulin, Total: 3 g/dL (ref 1.5–4.5)
Potassium: 4.3 mmol/L (ref 3.5–5.2)
Sodium: 141 mmol/L (ref 134–144)
TOTAL PROTEIN: 7.2 g/dL (ref 6.0–8.5)

## 2016-04-21 LAB — LIPID PANEL
CHOL/HDL RATIO: 6.4 ratio — AB (ref 0.0–5.0)
Cholesterol, Total: 236 mg/dL — ABNORMAL HIGH (ref 100–199)
HDL: 37 mg/dL — ABNORMAL LOW (ref >39)
LDL Calculated: 146 mg/dL — ABNORMAL HIGH (ref 0–99)
Triglycerides: 264 mg/dL — ABNORMAL HIGH (ref 0–149)
VLDL CHOLESTEROL CAL: 53 mg/dL — AB (ref 5–40)

## 2016-04-21 LAB — HEMOGLOBIN A1C
Est. average glucose Bld gHb Est-mCnc: 160 mg/dL
HEMOGLOBIN A1C: 7.2 % — AB (ref 4.8–5.6)

## 2016-04-21 MED ORDER — METFORMIN HCL ER 500 MG PO TB24: 500.0000 mg | ORAL_TABLET | Freq: Every day | ORAL | 5 refills | Status: DC

## 2016-10-21 ENCOUNTER — Other Ambulatory Visit: Payer: Self-pay | Admitting: Internal Medicine

## 2016-10-25 ENCOUNTER — Ambulatory Visit (INDEPENDENT_AMBULATORY_CARE_PROVIDER_SITE_OTHER): Payer: BLUE CROSS/BLUE SHIELD | Admitting: Internal Medicine

## 2016-10-25 ENCOUNTER — Encounter: Payer: Self-pay | Admitting: Internal Medicine

## 2016-10-25 VITALS — BP 142/78 | HR 103 | Ht 71.0 in | Wt 320.0 lb

## 2016-10-25 DIAGNOSIS — R7989 Other specified abnormal findings of blood chemistry: Secondary | ICD-10-CM

## 2016-10-25 DIAGNOSIS — M1710 Unilateral primary osteoarthritis, unspecified knee: Secondary | ICD-10-CM | POA: Diagnosis not present

## 2016-10-25 DIAGNOSIS — E119 Type 2 diabetes mellitus without complications: Secondary | ICD-10-CM | POA: Diagnosis not present

## 2016-10-25 DIAGNOSIS — R945 Abnormal results of liver function studies: Secondary | ICD-10-CM

## 2016-10-25 MED ORDER — METFORMIN HCL ER 500 MG PO TB24: 500.0000 mg | ORAL_TABLET | Freq: Every day | ORAL | 5 refills | Status: DC

## 2016-10-25 NOTE — Patient Instructions (Signed)
Check BP once a week - goal is less than 130/80

## 2016-10-25 NOTE — Progress Notes (Signed)
Date:  10/25/2016   Name:  Anthony Cohen   DOB:  07-05-1960   MRN:  [id number]   Chief Complaint: Diabetes (Doesn't test BS at home. ) Diabetes  He presents for his follow-up diabetic visit. He has type 2 diabetes mellitus. His disease course has been stable. Pertinent negatives for hypoglycemia include no headaches or tremors. Pertinent negatives for diabetes include no chest pain, no fatigue, no polydipsia and no polyuria. Symptoms are stable. Current diabetic treatment includes oral agent (monotherapy). He is compliant with treatment most of the time.  Knee Pain   There was no injury mechanism. The pain is present in the left knee and right knee. The quality of the pain is described as aching. The pain is at a severity of 2/10. The pain is mild. The pain has been fluctuating since onset. Pertinent negatives include no muscle weakness, numbness or tingling. The symptoms are aggravated by weight bearing. He has tried NSAIDs (glucosamine x 3 mo with no change) for the symptoms. The treatment provided moderate relief.  Started on metformin after last visit.  He has done well with no side effects.  He does not check BS and is not interested. He is not on ARB or ACE.  He is intolerant of statins due to elevated liver tests. He has not had a Hep C screening test.  Lab Results  Component Value Date   HGBA1C 7.2 (H) 04/20/2016   Lab Results  Component Value Date   ALT 57 (H) 04/20/2016   AST 34 04/20/2016   ALKPHOS 80 04/20/2016   BILITOT 0.5 04/20/2016    Review of Systems  Constitutional: Negative for appetite change, fatigue and unexpected weight change.  Eyes: Negative for visual disturbance.  Respiratory: Negative for cough, shortness of breath and wheezing.   Cardiovascular: Negative for chest pain, palpitations and leg swelling.  Gastrointestinal: Negative for abdominal pain and blood in stool.  Endocrine: Negative for polydipsia and polyuria.  Genitourinary: Negative for  dysuria and hematuria.  Musculoskeletal: Positive for arthralgias and joint swelling (knees).  Skin: Negative for color change and rash.  Neurological: Negative for tingling, tremors, numbness and headaches.  Psychiatric/Behavioral: Negative for dysphoric mood.    Patient Active Problem List   Diagnosis Date Noted  . GERD without esophagitis 04/20/2016  . Controlled type 2 diabetes mellitus without complication, without long-term current use of insulin ([place]) 06/16/2015  . OSA (obstructive sleep apnea) 03/10/2015  . Weight gain due to medication 03/10/2015  . Hyperlipidemia 03/10/2015  . Recurrent major depressive disorder, in partial remission ([place]) 02/19/2015  . Family history of digestive disorder 02/19/2015  . Tobacco use disorder, moderate, in early remission, dependence 02/19/2015    Prior to Admission medications   Medication Sig Start Date End Date Taking? Authorizing Provider  buPROPion (WELLBUTRIN XL) 150 MG 24 hr tablet Take 3 tablets by mouth daily. 02/27/15  Yes [provider]  DULoxetine (CYMBALTA) 60 MG capsule Take 1 capsule by mouth daily. 04/11/15  Yes [provider]  famotidine (PEPCID) 20 MG tablet Take 20 mg by mouth daily as needed for heartburn or indigestion.   Yes [provider]  metFORMIN (GLUCOPHAGE-XR) 500 MG 24 hr tablet TAKE 1 TABLET(500 MG) BY MOUTH DAILY WITH BREAKFAST 10/22/16  Yes Glean Hess, MD    Allergies  Allergen Reactions  . Atorvastatin Other (See Comments)    Transient elevation in LFTs ~2007  . Crestor [Rosuvastatin] Other (See Comments)    Elevated liver tests  .  Vascepa [Epa Ethyl Ester] Nausea Only    Past Surgical History:  Procedure Laterality Date  . NO PAST SURGERIES      Social History  Substance Use Topics  . Smoking status: Former Smoker    Packs/day: 1.00    Years: 3.00    Types: Cigarettes    Start date: 03/29/2015    Quit date: 03/29/2015  . Smokeless tobacco: Never Used  . Alcohol  use 0.0 oz/week   Depression screen [facility] 2/9 10/25/2016 04/20/2016  Decreased Interest 0 0  Down, Depressed, Hopeless 0 0  PHQ - 2 Score 0 0  Altered sleeping 0 -  Tired, decreased energy 0 -  Change in appetite 0 -  Feeling bad or failure about yourself  0 -  Trouble concentrating 0 -  Moving slowly or fidgety/restless 0 -  Suicidal thoughts 0 -  PHQ-9 Score 0 -     Medication list has been reviewed and updated.   Physical Exam  Constitutional: He is oriented to person, place, and time. He appears well-developed. No distress.  HENT:  Head: Normocephalic and atraumatic.  Cardiovascular: Normal rate, regular rhythm and normal heart sounds.   Pulmonary/Chest: Effort normal and breath sounds normal. No respiratory distress. He has no wheezes.  Musculoskeletal: He exhibits no edema.       Right knee: He exhibits decreased range of motion and effusion.       Left knee: He exhibits decreased range of motion. He exhibits no effusion.  Neurological: He is alert and oriented to person, place, and time.  Skin: Skin is warm and dry. No rash noted.  Psychiatric: He has a normal mood and affect. His behavior is normal. Thought content normal.  Nursing note and vitals reviewed.   BP (!) 142/78   Pulse (!) 103   Ht 5\' 11"  (1.803 m)   Wt (!) 320 lb (145.2 kg)   SpO2 93%   BMI 44.63 kg/m   Assessment and Plan: 1. Controlled type 2 diabetes mellitus without complication, without long-term current use of insulin (HCC) Consider ACE or ARB if urine + and/or if BP at home is elevated (pt to monitor weekly) - Hemoglobin A1c - metFORMIN (GLUCOPHAGE-XR) 500 MG 24 hr tablet; Take 1 tablet (500 mg total) by mouth daily with breakfast.  Dispense: 30 tablet; Refill: 5 - Microalbumin / creatinine urine ratio  2. Elevated liver function tests Repeat today Avoid tylenol - Hepatic function panel - Hepatitis C antibody  3. Arthritis of knee May take low doses of advil (200-400 mg per  day) Consider Ortho referral if worsening Exercise such as swimming or biking rather than walking   Meds ordered this encounter  Medications  . metFORMIN (GLUCOPHAGE-XR) 500 MG 24 hr tablet    Sig: Take 1 tablet (500 mg total) by mouth daily with breakfast.    Dispense:  30 tablet    Refill:  Hope, MD [facility]  10/25/2016

## 2016-10-26 ENCOUNTER — Other Ambulatory Visit: Payer: Self-pay | Admitting: Internal Medicine

## 2016-10-26 DIAGNOSIS — R945 Abnormal results of liver function studies: Secondary | ICD-10-CM

## 2016-10-26 DIAGNOSIS — R7989 Other specified abnormal findings of blood chemistry: Secondary | ICD-10-CM

## 2016-10-26 DIAGNOSIS — E1165 Type 2 diabetes mellitus with hyperglycemia: Secondary | ICD-10-CM

## 2016-10-26 DIAGNOSIS — IMO0001 Reserved for inherently not codable concepts without codable children: Secondary | ICD-10-CM

## 2016-10-26 DIAGNOSIS — E119 Type 2 diabetes mellitus without complications: Secondary | ICD-10-CM

## 2016-10-26 LAB — MICROALBUMIN / CREATININE URINE RATIO
CREATININE, UR: 434.3 mg/dL
MICROALB/CREAT RATIO: 6.8 mg/g{creat} (ref 0.0–30.0)
MICROALBUM., U, RANDOM: 29.5 ug/mL

## 2016-10-26 LAB — HEPATIC FUNCTION PANEL
ALBUMIN: 4.3 g/dL (ref 3.5–5.5)
ALT: 61 IU/L — ABNORMAL HIGH (ref 0–44)
AST: 54 IU/L — AB (ref 0–40)
Alkaline Phosphatase: 90 IU/L (ref 39–117)
BILIRUBIN TOTAL: 0.4 mg/dL (ref 0.0–1.2)
Bilirubin, Direct: 0.13 mg/dL (ref 0.00–0.40)
Total Protein: 7.3 g/dL (ref 6.0–8.5)

## 2016-10-26 LAB — HEMOGLOBIN A1C
Est. average glucose Bld gHb Est-mCnc: 177 mg/dL
HEMOGLOBIN A1C: 7.8 % — AB (ref 4.8–5.6)

## 2016-10-26 LAB — HEPATITIS C ANTIBODY

## 2016-10-26 MED ORDER — EMPAGLIFLOZIN 10 MG PO TABS: 10.0000 mg | ORAL_TABLET | Freq: Every day | ORAL | 3 refills | Status: DC

## 2016-10-26 MED ORDER — METFORMIN HCL ER 500 MG PO TB24: 500.0000 mg | ORAL_TABLET | Freq: Two times a day (BID) | ORAL | 3 refills | Status: DC

## 2016-11-04 ENCOUNTER — Telehealth: Payer: Self-pay

## 2016-11-08 NOTE — Telephone Encounter (Signed)
Error

## 2017-02-25 ENCOUNTER — Ambulatory Visit: Payer: BLUE CROSS/BLUE SHIELD | Admitting: Internal Medicine

## 2018-01-14 ENCOUNTER — Other Ambulatory Visit: Payer: Self-pay | Admitting: Internal Medicine

## 2018-03-10 ENCOUNTER — Ambulatory Visit
Admission: RE | Admit: 2018-03-10 | Discharge: 2018-03-10 | Disposition: A | Discharge status: Home / Self Care | Payer: Disability Insurance | Source: Ambulatory Visit | Attending: Internal Medicine | Admitting: Internal Medicine

## 2018-03-10 ENCOUNTER — Other Ambulatory Visit: Payer: Self-pay | Admitting: Internal Medicine

## 2018-03-10 DIAGNOSIS — R52 Pain, unspecified: Secondary | ICD-10-CM | POA: Diagnosis not present

## 2018-05-26 ENCOUNTER — Encounter: Payer: Self-pay | Admitting: Internal Medicine

## 2018-05-26 ENCOUNTER — Ambulatory Visit (INDEPENDENT_AMBULATORY_CARE_PROVIDER_SITE_OTHER): Payer: Self-pay | Admitting: Internal Medicine

## 2018-05-26 ENCOUNTER — Other Ambulatory Visit: Payer: Self-pay

## 2018-05-26 VITALS — BP 121/88 | HR 84 | Resp 16 | Ht 71.0 in | Wt 324.0 lb

## 2018-05-26 DIAGNOSIS — E1165 Type 2 diabetes mellitus with hyperglycemia: Secondary | ICD-10-CM

## 2018-05-26 DIAGNOSIS — G4733 Obstructive sleep apnea (adult) (pediatric): Secondary | ICD-10-CM

## 2018-05-26 DIAGNOSIS — F3341 Major depressive disorder, recurrent, in partial remission: Secondary | ICD-10-CM

## 2018-05-26 DIAGNOSIS — Q6650 Congenital pes planus, unspecified foot: Secondary | ICD-10-CM

## 2018-05-26 DIAGNOSIS — G2581 Restless legs syndrome: Secondary | ICD-10-CM | POA: Insufficient documentation

## 2018-05-26 MED ORDER — ROPINIROLE HCL 0.25 MG PO TABS: 0.2500 mg | ORAL_TABLET | Freq: Every day | ORAL | 2 refills | Status: DC

## 2018-05-26 NOTE — Patient Instructions (Signed)
Requip - start with one a 7 PM nightly for a week, increase by one tablet each week to a maximum of 0.75 mg.

## 2018-05-26 NOTE — Progress Notes (Signed)
Date:  05/26/2018   Name:  Anthony Cohen   DOB:  1960-07-10   MRN:  [id number]  Pt lost his job and insurance early last year.  Now on SSD but still no health insurance.  Chief Complaint: No chief complaint on file.  Diabetes  He has type 2 diabetes mellitus. Pertinent negatives for hypoglycemia include no dizziness, headaches or nervousness/anxiousness. Associated symptoms include foot paresthesias. Pertinent negatives for diabetes include no chest pain, no foot ulcerations, no polydipsia, no polyuria and no weakness. Current diabetic treatment includes diet. He is compliant with treatment most of the time. There is no compliance with monitoring of blood glucose. An ACE inhibitor/angiotensin II receptor blocker is not being taken.  Hyperlipidemia  This is a chronic problem. Pertinent negatives include no chest pain, myalgias or shortness of breath.  Depression         This is a chronic problem.The problem is unchanged.  Associated symptoms include no appetite change, no myalgias and no headaches.  Past treatments include SNRIs - Serotonin and norepinephrine reuptake inhibitors.  Compliance with treatment is good.  Previous treatment provided significant (continues to see Psych) relief. Foot pain - he was told years ago that he had major foot problems and would need surgery to prevent long term issues.  His mother could not afford the surgery.  The pain was getting progressively worse so he applied for and was granted disability.  He will get his first check this month. He had xrays during his disability exam of ankle and knee - mild- mod degenerative changes. He saw a podiatrist years ago and was given orthotics which helped. RLS - he described the onset in the evening of twitchy legs with the urge to move and get up and walk.  It lasts up until and for an hour or so after going to bed.  It is occurring most nights and interrupting his sleep.  Lab Results  Component Value Date   HGBA1C 7.8  (H) 10/25/2016   Lab Results  Component Value Date   CREATININE 0.93 04/20/2016   BUN 15 04/20/2016   NA 141 04/20/2016   K 4.3 04/20/2016   CL 101 04/20/2016   CO2 25 04/20/2016   Lab Results  Component Value Date   CHOL 236 (H) 04/20/2016   HDL 37 (L) 04/20/2016   LDLCALC 146 (H) 04/20/2016   TRIG 264 (H) 04/20/2016   CHOLHDL 6.4 (H) 04/20/2016     Review of Systems  Constitutional: Negative for appetite change, diaphoresis, fever and unexpected weight change.  Respiratory: Negative for cough, chest tightness and shortness of breath.   Cardiovascular: Negative for chest pain, palpitations and leg swelling.  Gastrointestinal: Negative for abdominal pain.  Endocrine: Negative for polydipsia and polyuria.  Musculoskeletal: Positive for arthralgias and gait problem. Negative for myalgias.  Neurological: Negative for dizziness, weakness and headaches.  Psychiatric/Behavioral: Positive for depression. Negative for dysphoric mood. The patient is not nervous/anxious.     Patient Active Problem List   Diagnosis Date Noted  . Elevated liver function tests 10/25/2016  . Arthritis of knee 10/25/2016  . GERD without esophagitis 04/20/2016  . DM (diabetes mellitus), type 2, uncontrolled ([place]) 06/16/2015  . OSA (obstructive sleep apnea) 03/10/2015  . Weight gain due to medication 03/10/2015  . Hyperlipidemia 03/10/2015  . Recurrent major depressive disorder, in partial remission ([place]) 02/19/2015  . Family history of digestive disorder 02/19/2015  . Tobacco use disorder, moderate, in early remission, dependence 02/19/2015  Allergies  Allergen Reactions  . Atorvastatin Other (See Comments)    Transient elevation in LFTs ~2007  . Crestor [Rosuvastatin] Other (See Comments)    Elevated liver tests  . Vascepa [Icosapent Ethyl] Nausea Only    Past Surgical History:  Procedure Laterality Date  . NO PAST SURGERIES      Social History   Tobacco Use  . Smoking status: Former  Smoker    Packs/day: 1.00    Years: 3.00    Pack years: 3.00    Types: Cigarettes    Start date: 03/29/2015    Last attempt to quit: 03/29/2015    Years since quitting: 3.1  . Smokeless tobacco: Never Used  Substance Use Topics  . Alcohol use: Yes    Alcohol/week: 0.0 standard drinks  . Drug use: No     Medication list has been reviewed and updated.  Current Meds  Medication Sig  . buPROPion (WELLBUTRIN XL) 150 MG 24 hr tablet Take 3 tablets by mouth daily.  Marland Kitchen venlafaxine (EFFEXOR) 100 MG tablet Take 100 mg by mouth 2 (two) times daily. Takes 175 mg in two different strengths.  . venlafaxine (EFFEXOR) 75 MG tablet Take 75 mg by mouth 2 (two) times daily. Takes 175 mg in two different strengths  . [DISCONTINUED] DULoxetine (CYMBALTA) 60 MG capsule Take 1 capsule by mouth daily.  . [DISCONTINUED] famotidine (PEPCID) 20 MG tablet Take 20 mg by mouth daily as needed for heartburn or indigestion.    PHQ 2/9 Scores 10/25/2016 04/20/2016  PHQ - 2 Score 0 0  PHQ- 9 Score 0 -   Wt Readings from Last 3 Encounters:  05/26/18 (!) 324 lb (147 kg)  10/25/16 (!) 320 lb (145.2 kg)  04/20/16 (!) 318 lb (144.2 kg)    Physical Exam Vitals signs and nursing note reviewed.  Constitutional:      General: He is not in acute distress.    Appearance: He is well-developed. He is obese.  HENT:     Head: Normocephalic and atraumatic.  Eyes:     Pupils: Pupils are equal, round, and reactive to light.  Neck:     Musculoskeletal: Normal range of motion.  Cardiovascular:     Rate and Rhythm: Normal rate and regular rhythm.     Pulses:          Dorsalis pedis pulses are 2+ on the right side and 2+ on the left side.       Posterior tibial pulses are 1+ on the right side and 1+ on the left side.  Pulmonary:     Effort: Pulmonary effort is normal. No respiratory distress.     Breath sounds: Normal breath sounds. No wheezing or rales.  Musculoskeletal: Normal range of motion.     Right lower leg: No  edema.     Left lower leg: No edema.     Comments: Both feet abnormal with pes planus as well as bony enlargement of medial mid foot and foot eversions.  Lymphadenopathy:     Cervical: No cervical adenopathy.  Skin:    General: Skin is warm and dry.     Findings: No rash.  Neurological:     Mental Status: He is alert and oriented to person, place, and time.  Psychiatric:        Behavior: Behavior normal.        Thought Content: Thought content normal.     BP 121/88   Pulse 84   Resp 16   Ht  5\' 11"  (1.803 m)   Wt (!) 324 lb (147 kg)   SpO2 95%   BMI 45.19 kg/m   Assessment and Plan: 1. Restless leg syndrome Rule out iron deficiency Begin low dose Requip and titrate up if needed - CBC with Differential/Platelet - rOPINIRole (REQUIP) 0.25 MG tablet; Take 1-3 tablets (0.25-0.75 mg total) by mouth at bedtime.  Dispense: 90 tablet; Refill: 2  2. Uncontrolled type 2 diabetes mellitus with hyperglycemia (HCC) Check labs and advise - Hemoglobin A1c - Comprehensive metabolic panel  3. OSA (obstructive sleep apnea) Intolerant of cpap machine  4. Recurrent major depressive disorder, in partial remission ([place]) Doing well on current therapy  5. Congenital pes planus, unspecified laterality Try to contact previous podiatrist in [place] for follow up   Partially dictated using Editor, commissioning. Any errors are unintentional.  Halina Maidens, MD [facility]  05/26/2018

## 2018-05-27 LAB — COMPREHENSIVE METABOLIC PANEL
A/G RATIO: 1.6 (ref 1.2–2.2)
ALT: 31 IU/L (ref 0–44)
AST: 28 IU/L (ref 0–40)
Albumin: 4.7 g/dL (ref 3.8–4.9)
Alkaline Phosphatase: 81 IU/L (ref 39–117)
BUN/Creatinine Ratio: 17 (ref 9–20)
BUN: 16 mg/dL (ref 6–24)
Bilirubin Total: 0.4 mg/dL (ref 0.0–1.2)
CO2: 23 mmol/L (ref 20–29)
Calcium: 9.4 mg/dL (ref 8.7–10.2)
Chloride: 101 mmol/L (ref 96–106)
Creatinine, Ser: 0.93 mg/dL (ref 0.76–1.27)
GFR calc Af Amer: 104 mL/min/{1.73_m2} (ref >59)
GFR, EST NON AFRICAN AMERICAN: 90 mL/min/{1.73_m2} (ref >59)
Globulin, Total: 2.9 g/dL (ref 1.5–4.5)
Glucose: 110 mg/dL — ABNORMAL HIGH (ref 65–99)
POTASSIUM: 4.6 mmol/L (ref 3.5–5.2)
Sodium: 139 mmol/L (ref 134–144)
Total Protein: 7.6 g/dL (ref 6.0–8.5)

## 2018-05-27 LAB — CBC WITH DIFFERENTIAL/PLATELET
BASOS ABS: 0.1 10*3/uL (ref 0.0–0.2)
Basos: 1 %
EOS (ABSOLUTE): 0.2 10*3/uL (ref 0.0–0.4)
Eos: 2 %
Hematocrit: 42.9 % (ref 37.5–51.0)
Hemoglobin: 15 g/dL (ref 13.0–17.7)
Immature Grans (Abs): 0 10*3/uL (ref 0.0–0.1)
Immature Granulocytes: 0 %
LYMPHS ABS: 2.3 10*3/uL (ref 0.7–3.1)
Lymphs: 32 %
MCH: 30.3 pg (ref 26.6–33.0)
MCHC: 35 g/dL (ref 31.5–35.7)
MCV: 87 fL (ref 79–97)
Monocytes Absolute: 0.5 10*3/uL (ref 0.1–0.9)
Monocytes: 7 %
Neutrophils Absolute: 4.2 10*3/uL (ref 1.4–7.0)
Neutrophils: 58 %
Platelets: 260 10*3/uL (ref 150–450)
RBC: 4.95 x10E6/uL (ref 4.14–5.80)
RDW: 12.8 % (ref 11.6–15.4)
WBC: 7.2 10*3/uL (ref 3.4–10.8)

## 2018-05-27 LAB — HEMOGLOBIN A1C
Est. average glucose Bld gHb Est-mCnc: 157 mg/dL
Hgb A1c MFr Bld: 7.1 % — ABNORMAL HIGH (ref 4.8–5.6)

## 2018-09-06 ENCOUNTER — Ambulatory Visit (INDEPENDENT_AMBULATORY_CARE_PROVIDER_SITE_OTHER): Payer: Self-pay | Admitting: Internal Medicine

## 2018-09-06 ENCOUNTER — Encounter: Payer: Self-pay | Admitting: Internal Medicine

## 2018-09-06 ENCOUNTER — Telehealth: Payer: Self-pay | Admitting: General Practice

## 2018-09-06 ENCOUNTER — Other Ambulatory Visit: Payer: Self-pay

## 2018-09-06 ENCOUNTER — Ambulatory Visit: Payer: Self-pay | Admitting: Internal Medicine

## 2018-09-06 VITALS — BP 133/76 | Temp 98.3°F | Ht 71.0 in | Wt 324.0 lb

## 2018-09-06 DIAGNOSIS — Z20822 Contact with and (suspected) exposure to covid-19: Secondary | ICD-10-CM

## 2018-09-06 DIAGNOSIS — F3341 Major depressive disorder, recurrent, in partial remission: Secondary | ICD-10-CM

## 2018-09-06 DIAGNOSIS — Q6650 Congenital pes planus, unspecified foot: Secondary | ICD-10-CM

## 2018-09-06 DIAGNOSIS — E119 Type 2 diabetes mellitus without complications: Secondary | ICD-10-CM

## 2018-09-06 DIAGNOSIS — G2581 Restless legs syndrome: Secondary | ICD-10-CM

## 2018-09-06 DIAGNOSIS — R6889 Other general symptoms and signs: Secondary | ICD-10-CM

## 2018-09-06 NOTE — Telephone Encounter (Signed)
-----   Message from Glean Hess, MD sent at 09/06/2018  4:00 PM EDT ----- Regarding: covid testing Please test this patient for Covid-19.

## 2018-09-06 NOTE — Addendum Note (Signed)
Addended by: Dimple Nanas on: 09/06/2018 04:47 PM   Modules accepted: Orders

## 2018-09-06 NOTE — Telephone Encounter (Signed)
Pt has been scheduled for covid testing.  Scheduled with pt directly.  Pt was referred by: Glean Hess, MD

## 2018-09-06 NOTE — Progress Notes (Signed)
Date:  09/06/2018   Name:  Anthony Cohen   DOB:  [birth year]   MRN:  [id number]  I connected with this patient, Anthony Cohen, by telephone at the patient's home.  I verified that I am speaking with the correct person using two identifiers. This visit was conducted via telephone due to the Covid-19 outbreak from my office at [facility] in [place], [place]. I discussed the limitations, risks, security and privacy concerns of performing an evaluation and management service by telephone. I also discussed with the patient that there may be a patient responsible charge related to this service. The patient expressed understanding and agreed to proceed.  Chief Complaint: Leg Pain (Restless leg follow up. Started on Requip in March. No relief. Patient currently on disability for leg/foot pain.) and Generalized Body Aches (Started yesterday. No fever. Loss of taste of smell. Congestion in nose.)  Leg Pain   Influenza This is a new problem. The current episode started yesterday. Associated symptoms include arthralgias, chills, congestion, diaphoresis and myalgias. Pertinent negatives include no chest pain, coughing, fever (but taking tylenol every 4-6 hours), headaches or sore throat. Associated symptoms comments: Loss of taste and smell. Nothing aggravates the symptoms. He has tried acetaminophen for the symptoms.  Diabetes He presents for his follow-up diabetic visit. He has type 2 diabetes mellitus. His disease course has been stable. Pertinent negatives for hypoglycemia include no dizziness, headaches or nervousness/anxiousness. Pertinent negatives for diabetes include no chest pain. His weight is stable. He monitors blood glucose at home 3-4 x per week. There is no change in his home blood glucose trend.   RLS - felt to be this last visit.  Did not response to Requip up to .75 mg.  He is still bothered by significant sx.  He cant not afford a neurology evaluation at this time due to lack of  insurance.  Depression - he can no longer afford the cost of psychiatry visits.  He is tapering and planning to stop effexor in the near future.  Review of Systems  Constitutional: Positive for chills and diaphoresis. Negative for fever (but taking tylenol every 4-6 hours).  HENT: Positive for congestion. Negative for postnasal drip, sinus pressure, sinus pain, sore throat and trouble swallowing.        Loss of taste and smell  Respiratory: Negative for cough, chest tightness, shortness of breath and wheezing.   Cardiovascular: Negative for chest pain, palpitations and leg swelling.  Musculoskeletal: Positive for arthralgias, gait problem and myalgias.  Neurological: Negative for dizziness, light-headedness and headaches.  Psychiatric/Behavioral: Negative for dysphoric mood. The patient is not nervous/anxious.     Patient Active Problem List   Diagnosis Date Noted  . Pes planus, congenital 05/26/2018  . Restless leg syndrome 05/26/2018  . Elevated liver function tests 10/25/2016  . Arthritis of knee 10/25/2016  . GERD without esophagitis 04/20/2016  . DM (diabetes mellitus), type 2, uncontrolled ([place]) 06/16/2015  . OSA (obstructive sleep apnea) 03/10/2015  . Weight gain due to medication 03/10/2015  . Hyperlipidemia 03/10/2015  . Recurrent major depressive disorder, in partial remission ([place]) 02/19/2015  . Family history of digestive disorder 02/19/2015  . Tobacco use disorder, moderate, in early remission, dependence 02/19/2015    Allergies  Allergen Reactions  . Atorvastatin Other (See Comments)    Transient elevation in LFTs ~2007  . Crestor [Rosuvastatin] Other (See Comments)    Elevated liver tests  . Vascepa [Icosapent Ethyl] Nausea Only    Past Surgical  History:  Procedure Laterality Date  . NO PAST SURGERIES      Social History   Tobacco Use  . Smoking status: Former Smoker    Packs/day: 1.00    Years: 3.00    Pack years: 3.00    Types: Cigarettes    Start  date: 03/29/2015    Quit date: 03/29/2015    Years since quitting: 3.4  . Smokeless tobacco: Never Used  Substance Use Topics  . Alcohol use: Yes    Alcohol/week: 0.0 standard drinks  . Drug use: No     Medication list has been reviewed and updated.  Current Meds  Medication Sig  . buPROPion (WELLBUTRIN XL) 150 MG 24 hr tablet Take 2 tablets by mouth daily.   Marland Kitchen venlafaxine (EFFEXOR) 75 MG tablet Take 75 mg by mouth daily.     PHQ 2/9 Scores 09/06/2018 10/25/2016 04/20/2016  PHQ - 2 Score 0 0 0  PHQ- 9 Score 0 0 -    BP Readings from Last 3 Encounters:  09/06/18 133/76  05/26/18 121/88  03/21/18 (!) 148/99    Physical Exam Pulmonary:     Effort: Pulmonary effort is normal.     Breath sounds: Normal breath sounds.  Neurological:     Mental Status: He is alert.  Psychiatric:        Attention and Perception: Attention normal.        Mood and Affect: Mood normal.        Speech: Speech normal.     Wt Readings from Last 3 Encounters:  09/06/18 (!) 324 lb (147 kg)  05/26/18 (!) 324 lb (147 kg)  10/25/16 (!) 320 lb (145.2 kg)    BP 133/76   Temp 98.3 F (36.8 C) (Oral)   Ht 5\' 11"  (1.803 m)   Wt (!) 324 lb (147 kg)   BMI 45.19 kg/m   Assessment and Plan: 1. Suspected Covid-19 Virus Infection Referred for testing Continue tylenol and fluids Avoid close contact with family/spouse  2. Recurrent major depressive disorder, in partial remission ([place]) Weaning off medication Encouraged him to call me if sx worsen to resume medications  3. Restless leg syndrome Did not respond to Requip  4. Type 2 diabetes mellitus without complication, without long-term current use of insulin (HCC) Stable per patient - recommend that he record his BS readings  5. Congenital pes planus, unspecified laterality He has constant pain and is now on disability due to this He has no insurance but may need pain management referral in the future  I spent 9 minutes on this encounter.  Partially dictated using Editor, commissioning. Any errors are unintentional.  Halina Maidens, MD [facility]  09/06/2018

## 2018-09-07 ENCOUNTER — Other Ambulatory Visit: Payer: Disability Insurance

## 2018-09-07 DIAGNOSIS — Z20822 Contact with and (suspected) exposure to covid-19: Secondary | ICD-10-CM

## 2018-09-09 LAB — NOVEL CORONAVIRUS, NAA: SARS-CoV-2, NAA: NOT DETECTED

## 2018-09-11 ENCOUNTER — Telehealth: Payer: Self-pay

## 2018-09-11 NOTE — Telephone Encounter (Signed)
Patient called saying he di a telephone visit last week. Was tested for COVID and tested negative. He is still having flu like symptoms. And wants to know if he can be prescribed anything to relieve this?  Please Advise.  Body aches, with nasal congestion.

## 2018-09-11 NOTE — Telephone Encounter (Signed)
Just fluids and tylenol or advil for fever, headache, body aches. If he has more focal symptoms let me know.

## 2018-09-11 NOTE — Telephone Encounter (Signed)
Called and spoke with patient. Informed of this. He verbalized understanding and said if he develops any other symptoms he will the call the office and inform us.

## 2018-09-18 ENCOUNTER — Telehealth: Payer: Self-pay

## 2018-09-18 MED ORDER — BUPROPION HCL ER (XL) 450 MG PO TB24: 1.0000 | ORAL_TABLET | Freq: Every day | ORAL | 5 refills | Status: DC

## 2018-09-18 MED ORDER — VENLAFAXINE HCL ER 150 MG PO CP24: 150.0000 mg | ORAL_CAPSULE | Freq: Every day | ORAL | 5 refills | Status: DC

## 2018-09-18 NOTE — Telephone Encounter (Signed)
Patient called saying Dr. Army Melia said she could take over his depression medications for him. It costs him $200 a visit to see psychiatry and he has no insurance at this time.  Spoke with Dr. Army Melia and she agreed she can take over his medications.  He is currently taking Bupropion 450 mg daily and Effexor 150 mg daily.   Pharmacy: [facility] in [place].  He will call later in the week for RF when he is almost out.

## 2018-10-04 ENCOUNTER — Other Ambulatory Visit: Payer: Self-pay

## 2018-10-04 MED ORDER — VENLAFAXINE HCL ER 150 MG PO CP24: 150.0000 mg | ORAL_CAPSULE | Freq: Every day | ORAL | 5 refills | Status: DC

## 2018-10-04 MED ORDER — BUPROPION HCL ER (XL) 450 MG PO TB24: 1.0000 | ORAL_TABLET | Freq: Every day | ORAL | 5 refills | Status: DC

## 2019-05-16 ENCOUNTER — Other Ambulatory Visit: Payer: Self-pay | Admitting: Internal Medicine

## 2019-05-17 NOTE — Telephone Encounter (Signed)
Informed patient

## 2019-05-23 ENCOUNTER — Encounter: Payer: Self-pay | Admitting: Internal Medicine

## 2019-05-23 ENCOUNTER — Other Ambulatory Visit: Payer: Self-pay

## 2019-05-23 ENCOUNTER — Ambulatory Visit (INDEPENDENT_AMBULATORY_CARE_PROVIDER_SITE_OTHER): Payer: Self-pay | Admitting: Internal Medicine

## 2019-05-23 ENCOUNTER — Other Ambulatory Visit: Payer: Self-pay | Admitting: Internal Medicine

## 2019-05-23 VITALS — BP 126/78 | HR 90 | Temp 98.6°F | Ht 71.0 in | Wt 263.0 lb

## 2019-05-23 DIAGNOSIS — R252 Cramp and spasm: Secondary | ICD-10-CM

## 2019-05-23 DIAGNOSIS — Q6650 Congenital pes planus, unspecified foot: Secondary | ICD-10-CM

## 2019-05-23 DIAGNOSIS — E119 Type 2 diabetes mellitus without complications: Secondary | ICD-10-CM

## 2019-05-23 DIAGNOSIS — F3341 Major depressive disorder, recurrent, in partial remission: Secondary | ICD-10-CM

## 2019-05-23 DIAGNOSIS — F17201 Nicotine dependence, unspecified, in remission: Secondary | ICD-10-CM

## 2019-05-23 DIAGNOSIS — G2581 Restless legs syndrome: Secondary | ICD-10-CM

## 2019-05-23 MED ORDER — GABAPENTIN 100 MG PO CAPS: 100.0000 mg | ORAL_CAPSULE | Freq: Every day | ORAL | 0 refills | Status: DC

## 2019-05-23 MED ORDER — VENLAFAXINE HCL ER 150 MG PO CP24: ORAL_CAPSULE | ORAL | 1 refills | Status: DC

## 2019-05-23 NOTE — Patient Instructions (Signed)
Potassium supplement and Magnesium supplement daily

## 2019-05-23 NOTE — Progress Notes (Signed)
Date:  05/23/2019   Name:  Anthony Cohen   DOB:  [birth year]   MRN:  [id number]   Chief Complaint: Depression (Follow up w/ refills.)  Depression        This is a chronic problem.  The problem occurs daily.  The problem has been resolved since onset.  Associated symptoms include myalgias (intermittent muscle cramps in feet, thighs and hands).  Associated symptoms include no fatigue and no headaches.  Past treatments include SNRIs - Serotonin and norepinephrine reuptake inhibitors and other medications.  Compliance with treatment is good. Diabetes He presents for his follow-up diabetic visit. He has type 2 diabetes mellitus. Pertinent negatives for hypoglycemia include no dizziness, headaches or nervousness/anxiousness. Pertinent negatives for diabetes include no chest pain, no fatigue, no polydipsia and no polyphagia. Current diabetic treatment includes diet. His weight is decreasing steadily (has lost 45 lbs with diet change and increase in physical activity). He is following a generally healthy diet. There is no compliance with monitoring of blood glucose. An ACE inhibitor/angiotensin II receptor blocker is not being taken.  Foot Injury  There was no injury mechanism. The pain is present in the right foot and left foot.   FSBS 1 hour after eating = 133  Lab Results  Component Value Date   CREATININE 0.93 05/26/2018   BUN 16 05/26/2018   NA 139 05/26/2018   K 4.6 05/26/2018   CL 101 05/26/2018   CO2 23 05/26/2018   Lab Results  Component Value Date   CHOL 236 (H) 04/20/2016   HDL 37 (L) 04/20/2016   LDLCALC 146 (H) 04/20/2016   TRIG 264 (H) 04/20/2016   CHOLHDL 6.4 (H) 04/20/2016   Lab Results  Component Value Date   TSH 2.240 03/10/2015   Lab Results  Component Value Date   HGBA1C 7.1 (H) 05/26/2018     Review of Systems  Constitutional: Positive for unexpected weight change (has lost 45 lbs with effort). Negative for chills, fatigue and fever.  Eyes: Negative for  visual disturbance.  Respiratory: Negative for chest tightness, shortness of breath and wheezing.   Cardiovascular: Negative for chest pain and leg swelling.  Endocrine: Negative for polydipsia and polyphagia.  Genitourinary: Negative for dysuria.  Musculoskeletal: Positive for arthralgias (severe foot pain due to pes planus), gait problem and myalgias (intermittent muscle cramps in feet, thighs and hands). Negative for joint swelling.  Neurological: Negative for dizziness, light-headedness and headaches.  Psychiatric/Behavioral: Positive for depression. Negative for dysphoric mood and sleep disturbance. The patient is not nervous/anxious.     Patient Active Problem List   Diagnosis Date Noted  . Pes planus, congenital 05/26/2018  . Restless leg syndrome 05/26/2018  . Elevated liver function tests 10/25/2016  . Arthritis of knee 10/25/2016  . GERD without esophagitis 04/20/2016  . Diabetes mellitus, type 2 ([place]) 06/16/2015  . OSA (obstructive sleep apnea) 03/10/2015  . Weight gain due to medication 03/10/2015  . Hyperlipidemia 03/10/2015  . Recurrent major depressive disorder, in partial remission ([place]) 02/19/2015  . Family history of digestive disorder 02/19/2015  . Tobacco use disorder, mild, in sustained remission 02/19/2015    Allergies  Allergen Reactions  . Atorvastatin Other (See Comments)    Transient elevation in LFTs ~2007  . Crestor [Rosuvastatin] Other (See Comments)    Elevated liver tests  . Vascepa [Icosapent Ethyl] Nausea Only    Past Surgical History:  Procedure Laterality Date  . NO PAST SURGERIES      Social History  Tobacco Use  . Smoking status: Former Smoker    Packs/day: 1.00    Years: 3.00    Pack years: 3.00    Types: Cigarettes    Start date: 03/29/2015    Quit date: 03/29/2015    Years since quitting: 4.1  . Smokeless tobacco: Never Used  Substance Use Topics  . Alcohol use: Yes    Alcohol/week: 0.0 standard drinks  . Drug use: No      Medication list has been reviewed and updated.  Current Meds  Medication Sig  . buPROPion (WELLBUTRIN XL) 150 MG 24 hr tablet TAKE THREE TABLETS BY MOUTH DAILY  . venlafaxine XR (EFFEXOR-XR) 150 MG 24 hr capsule TAKE ONE CAPSULE BY MOUTH EVERY MORNING WITH BREAKFAST    PHQ 2/9 Scores 05/23/2019 09/06/2018 10/25/2016 04/20/2016  PHQ - 2 Score 0 0 0 0  PHQ- 9 Score 0 0 0 -    BP Readings from Last 3 Encounters:  05/23/19 126/78  09/06/18 133/76  05/26/18 121/88    Physical Exam Vitals and nursing note reviewed.  Constitutional:      General: He is not in acute distress.    Appearance: Normal appearance. He is well-developed.  HENT:     Head: Normocephalic and atraumatic.  Cardiovascular:     Rate and Rhythm: Normal rate and regular rhythm.     Heart sounds: No murmur.  Pulmonary:     Effort: Pulmonary effort is normal. No respiratory distress.     Breath sounds: No wheezing or rhonchi.  Musculoskeletal:     Cervical back: Normal range of motion.     Right foot: Deformity present.     Left foot: Deformity present.  Feet:     Right foot:     Skin integrity: Callus present. No ulcer, blister, skin breakdown or erythema.     Left foot:     Skin integrity: Callus present. No ulcer, blister, skin breakdown or erythema.  Lymphadenopathy:     Cervical: No cervical adenopathy.  Skin:    General: Skin is warm and dry.     Findings: No rash.  Neurological:     Mental Status: He is alert and oriented to person, place, and time.  Psychiatric:        Behavior: Behavior normal.        Thought Content: Thought content normal.     Wt Readings from Last 3 Encounters:  05/23/19 263 lb (119.3 kg)  09/06/18 (!) 324 lb (147 kg)  05/26/18 (!) 324 lb (147 kg)    BP 126/78   Pulse 90   Temp 98.6 F (37 C) (Oral)   Ht 5\' 11"  (1.803 m)   Wt 263 lb (119.3 kg)   SpO2 96%   BMI 36.68 kg/m   Assessment and Plan: 1. Recurrent major depressive disorder, in partial remission  (HCC) Clinically stable on current regimen with good control of symptoms, No SI or HI. Will continue current therapy. - venlafaxine XR (EFFEXOR-XR) 150 MG 24 hr capsule; TAKE ONE CAPSULE BY MOUTH EVERY MORNING WITH BREAKFAST  Dispense: 90 capsule; Refill: 1  2. Congenital pes planus, unspecified laterality With slowly worsening bilateral foot pain Will try gabapentin at HS - may help with pain and muscle spasms  3. Restless leg syndrome Fairly well controlled currently on no medication - gabapentin (NEURONTIN) 100 MG capsule; Take 1-3 capsules (100-300 mg total) by mouth at bedtime.  Dispense: 90 capsule; Refill: 0  4. Muscle cramps Recommend potassium and magnesium supplements  daily  5. Type 2 diabetes mellitus without complication, without long-term current use of insulin (HCC) Clinically stable by exam and report without s/s of hypoglycemia. Random BS 133 today Continue diet changes and weight loss No labs done due to financial situation   Partially dictated using Editor, commissioning. Any errors are unintentional.  Halina Maidens, MD [facility]  05/23/2019

## 2019-06-18 ENCOUNTER — Other Ambulatory Visit: Payer: Self-pay | Admitting: Internal Medicine

## 2019-06-18 DIAGNOSIS — G2581 Restless legs syndrome: Secondary | ICD-10-CM

## 2019-07-11 ENCOUNTER — Other Ambulatory Visit: Payer: Self-pay | Admitting: Internal Medicine

## 2019-07-17 ENCOUNTER — Other Ambulatory Visit: Payer: Self-pay | Admitting: Internal Medicine

## 2019-07-17 DIAGNOSIS — G2581 Restless legs syndrome: Secondary | ICD-10-CM

## 2019-07-17 NOTE — Telephone Encounter (Signed)
Requested Prescriptions  Pending Prescriptions Disp Refills  . gabapentin (NEURONTIN) 100 MG capsule [Pharmacy Med Name: GABAPENTIN 100 MG CAPSULE] 270 capsule 1    Sig: TAKE ONE TO THREE CAPSULES BY MOUTH EVERY NIGHT AT BEDTIME     Neurology: Anticonvulsants - gabapentin Passed - 07/17/2019  6:21 AM      Passed - Valid encounter within last 12 months    Recent Outpatient Visits          1 month ago Recurrent major depressive disorder, in partial remission [facility])   Cale, Laura H, MD   10 months ago Suspected Covid-19 Virus Infection   [facility] Glean Hess, MD   1 year ago Restless leg syndrome   [facility] Glean Hess, MD   2 years ago Controlled type 2 diabetes mellitus without complication, without long-term current use of insulin [facility])   [facility] Glean Hess, MD   3 years ago Annual physical exam   [facility] Glean Hess, MD      Future Appointments            In 4 months Army Melia Jesse Sans, MD [facility], [facility]

## 2019-09-11 DIAGNOSIS — G5682 Other specified mononeuropathies of left upper limb: Secondary | ICD-10-CM | POA: Insufficient documentation

## 2019-09-21 ENCOUNTER — Emergency Department
Admission: EM | Admit: 2019-09-21 | Discharge: 2019-09-21 | Disposition: A | Discharge status: Home / Self Care | Payer: Self-pay | Attending: Emergency Medicine | Admitting: Emergency Medicine

## 2019-09-21 ENCOUNTER — Emergency Department: Payer: Self-pay

## 2019-09-21 ENCOUNTER — Encounter: Payer: Self-pay | Admitting: Emergency Medicine

## 2019-09-21 ENCOUNTER — Other Ambulatory Visit: Payer: Self-pay

## 2019-09-21 DIAGNOSIS — Y999 Unspecified external cause status: Secondary | ICD-10-CM | POA: Insufficient documentation

## 2019-09-21 DIAGNOSIS — Z79899 Other long term (current) drug therapy: Secondary | ICD-10-CM | POA: Insufficient documentation

## 2019-09-21 DIAGNOSIS — R202 Paresthesia of skin: Secondary | ICD-10-CM | POA: Insufficient documentation

## 2019-09-21 DIAGNOSIS — E119 Type 2 diabetes mellitus without complications: Secondary | ICD-10-CM | POA: Insufficient documentation

## 2019-09-21 DIAGNOSIS — M79602 Pain in left arm: Secondary | ICD-10-CM

## 2019-09-21 DIAGNOSIS — X501XXA Overexertion from prolonged static or awkward postures, initial encounter: Secondary | ICD-10-CM | POA: Insufficient documentation

## 2019-09-21 DIAGNOSIS — T148XXA Other injury of unspecified body region, initial encounter: Secondary | ICD-10-CM

## 2019-09-21 DIAGNOSIS — Y939 Activity, unspecified: Secondary | ICD-10-CM | POA: Insufficient documentation

## 2019-09-21 DIAGNOSIS — S46912A Strain of unspecified muscle, fascia and tendon at shoulder and upper arm level, left arm, initial encounter: Secondary | ICD-10-CM | POA: Insufficient documentation

## 2019-09-21 DIAGNOSIS — Z87891 Personal history of nicotine dependence: Secondary | ICD-10-CM | POA: Insufficient documentation

## 2019-09-21 DIAGNOSIS — Y929 Unspecified place or not applicable: Secondary | ICD-10-CM | POA: Insufficient documentation

## 2019-09-21 LAB — BASIC METABOLIC PANEL
Anion gap: 11 (ref 5–15)
BUN: 16 mg/dL (ref 6–20)
CO2: 25 mmol/L (ref 22–32)
Calcium: 9.2 mg/dL (ref 8.9–10.3)
Chloride: 103 mmol/L (ref 98–111)
Creatinine, Ser: 0.8 mg/dL (ref 0.61–1.24)
GFR calc Af Amer: >60 mL/min (ref >60)
GFR calc non Af Amer: >60 mL/min (ref >60)
Glucose, Bld: 134 mg/dL — ABNORMAL HIGH (ref 70–99)
Potassium: 4.2 mmol/L (ref 3.5–5.1)
Sodium: 139 mmol/L (ref 135–145)

## 2019-09-21 LAB — TROPONIN I (HIGH SENSITIVITY)
Troponin I (High Sensitivity): 11 ng/L (ref <18)
Troponin I (High Sensitivity): 10 ng/L (ref <18)

## 2019-09-21 LAB — CBC
HCT: 45.7 % (ref 39.0–52.0)
Hemoglobin: 15.9 g/dL (ref 13.0–17.0)
MCH: 33.1 pg (ref 26.0–34.0)
MCHC: 34.8 g/dL (ref 30.0–36.0)
MCV: 95.2 fL (ref 80.0–100.0)
Platelets: 217 10*3/uL (ref 150–400)
RBC: 4.8 MIL/uL (ref 4.22–5.81)
RDW: 12.6 % (ref 11.5–15.5)
WBC: 5.9 10*3/uL (ref 4.0–10.5)
nRBC: 0 % (ref 0.0–0.2)

## 2019-09-21 MED ORDER — PREDNISONE 10 MG PO TABS: ORAL_TABLET | ORAL | 0 refills | Status: DC

## 2019-09-21 MED ORDER — LIDOCAINE 5 % EX PTCH: 1.0000 | MEDICATED_PATCH | CUTANEOUS | 0 refills | Status: DC

## 2019-09-21 MED ORDER — OXYCODONE-ACETAMINOPHEN 5-325 MG PO TABS
1.0000 | ORAL_TABLET | Freq: Once | ORAL | Status: AC
  Administered 2019-09-21: 1 via ORAL
  Filled 2019-09-21: qty 1

## 2019-09-21 MED ORDER — CYCLOBENZAPRINE HCL 5 MG PO TABS: ORAL_TABLET | ORAL | 0 refills | Status: DC

## 2019-09-21 NOTE — ED Triage Notes (Signed)
Says he injured left arm recently,  Last night he rolled over and it started hurting much worse-stabbing painpain increases with certain movements

## 2019-09-21 NOTE — ED Provider Notes (Signed)
[facility] Emergency Department Provider Note  ____________________________________________  Time seen: Approximately 11:36 AM  I have reviewed the triage vital signs and the nursing notes.   HISTORY  Chief Complaint Arm Pain    HPI Anthony Cohen is a 59 y.o. male that presents to the emergency department for evaluation of left upper arm pain for 2 weeks.  Pain is constant.  Pain is worse when he moves his head or shrugs his shoulders.  Occasionally he will have some numbness to his left hand and occasionally his wrist will feel swollen.  No specific trauma.  He rolled over last night and felt a pop to the inside of his upper arm.  Pain to the inside of his upper arm has been worse since.  He describes the pain as a throbbing pain and is currently primarily to his left upper inner arm.  No fever, rash, shortness of breath, chest pain.   History reviewed. No pertinent past medical history.  Patient Active Problem List   Diagnosis Date Noted   Pes planus, congenital 05/26/2018   Restless leg syndrome 05/26/2018   Elevated liver function tests 10/25/2016   Arthritis of knee 10/25/2016   GERD without esophagitis 04/20/2016   Diabetes mellitus, type 2 ([place]) 06/16/2015   OSA (obstructive sleep apnea) 03/10/2015   Weight gain due to medication 03/10/2015   Hyperlipidemia 03/10/2015   Recurrent major depressive disorder, in partial remission ([place]) 02/19/2015   Family history of digestive disorder 02/19/2015   Tobacco use disorder, mild, in sustained remission 02/19/2015    Past Surgical History:  Procedure Laterality Date   NO PAST SURGERIES      Prior to Admission medications   Medication Sig Start Date End Date Taking? Authorizing Provider  buPROPion (WELLBUTRIN XL) 150 MG 24 hr tablet TAKE THREE TABLETS BY MOUTH DAILY Patient taking differently: Take 450 mg by mouth daily.  07/11/19   Glean Hess, MD  cyclobenzaprine (FLEXERIL) 5 MG  tablet Take 1-2 tablets 3 times daily as needed 09/21/19   Laban Emperor, PA-C  gabapentin (NEURONTIN) 100 MG capsule TAKE ONE TO THREE CAPSULES BY MOUTH EVERY NIGHT AT BEDTIME Patient taking differently: Take 100-300 mg by mouth at bedtime.  07/17/19   Glean Hess, MD  lidocaine (LIDODERM) 5 % Place 1 patch onto the skin daily. Remove & Discard patch within 12 hours or as directed by MD 09/21/19   Laban Emperor, PA-C  predniSONE (DELTASONE) 10 MG tablet Take 6 tablets on day 1, take 5 tablets on day 2, take 4 tablets on day 3, take 3 tablets on day 4, take 2 tablets on day 5, take 1 tablet on day 6 09/21/19   Laban Emperor, PA-C  venlafaxine XR (EFFEXOR-XR) 150 MG 24 hr capsule TAKE ONE CAPSULE BY MOUTH EVERY MORNING WITH BREAKFAST Patient taking differently: Take 150 mg by mouth daily with breakfast. TAKE ONE CAPSULE BY MOUTH EVERY MORNING WITH BREAKFAST 05/23/19   Glean Hess, MD    Allergies Atorvastatin, Crestor [rosuvastatin], and Vascepa [icosapent ethyl]  Family History  Problem Relation Age of Onset   Hypertension Mother    Breast cancer Mother     Social History Social History   Tobacco Use   Smoking status: Former Smoker    Packs/day: 1.00    Years: 3.00    Pack years: 3.00    Types: Cigarettes    Start date: 03/29/2015    Quit date: 03/29/2015    Years since quitting:  4.4   Smokeless tobacco: Never Used  Vaping Use   Vaping Use: Never used  Substance Use Topics   Alcohol use: Yes    Alcohol/week: 0.0 standard drinks   Drug use: No     Review of Systems  Constitutional: No fever/chills ENT: No upper respiratory complaints. Cardiovascular: No chest pain. Respiratory: No cough. No SOB. Gastrointestinal: No abdominal pain.  No nausea, no vomiting.  Musculoskeletal: Positive for arm pain. Skin: Negative for rash, abrasions, lacerations, ecchymosis. Neurological: Negative for headaches, numbness or  tingling   ____________________________________________   PHYSICAL EXAM:  VITAL SIGNS: ED Triage Vitals  Enc Vitals Group     BP 09/21/19 0956 (!) 137/104     Pulse Rate 09/21/19 0956 83     Resp 09/21/19 0956 16     Temp 09/21/19 0956 98.2 F (36.8 C)     Temp Source 09/21/19 0956 Oral     SpO2 09/21/19 0956 94 %     Weight 09/21/19 0953 260 lb (117.9 kg)     Height 09/21/19 0953 5\' 11"  (1.803 m)     Head Circumference --      Peak Flow --      Pain Score 09/21/19 0953 10     Pain Loc --      Pain Edu? --      Excl. in [place]? --      Constitutional: Alert and oriented. Well appearing and in no acute distress. Eyes: Conjunctivae are normal. PERRL. EOMI. Head: Atraumatic. ENT:      Ears:      Nose: No congestion/rhinnorhea.      Mouth/Throat: Mucous membranes are moist.  Neck: No stridor.   Cardiovascular: Normal rate, regular rhythm.  Good peripheral circulation.  Symmetric radial pulses bilaterally. Respiratory: Normal respiratory effort without tachypnea or retractions. Lungs CTAB. Good air entry to the bases with no decreased or absent breath sounds. Musculoskeletal: Full range of motion to all extremities. No gross deformities appreciated.  Tenderness to palpation to left upper mid inner arm.  Pain elicited with range of motion of neck and left shoulder.  Full range of motion of left shoulder, left elbow, wrist.  Strength equal in upper extremities bilaterally.  Grip strength intact. Neurologic:  Normal speech and language. No gross focal neurologic deficits are appreciated.  Skin:  Skin is warm, dry and intact. No rash noted.  No wounds. Psychiatric: Mood and affect are normal. Speech and behavior are normal. Patient exhibits appropriate insight and judgement.   ____________________________________________   LABS (all labs ordered are listed, but only abnormal results are displayed)  Labs Reviewed  BASIC METABOLIC PANEL - Abnormal; Notable for the following  components:      Result Value   Glucose, Bld 134 (*)    All other components within normal limits  CBC  TROPONIN I (HIGH SENSITIVITY)  TROPONIN I (HIGH SENSITIVITY)   ____________________________________________  EKG   ____________________________________________  RADIOLOGY Robinette Haines, personally viewed and evaluated these images (plain radiographs) as part of my medical decision making, as well as reviewing the written report by the radiologist.  DG Chest 2 View  Result Date: 09/21/2019 CLINICAL DATA:  Left arm pain after injury. EXAM: CHEST - 2 VIEW COMPARISON:  May 21, 2013. FINDINGS: The heart size and mediastinal contours are within normal limits. Both lungs are clear. The visualized skeletal structures are unremarkable. IMPRESSION: No active cardiopulmonary disease. Electronically Signed   By: Marijo Conception M.D.   On: 09/21/2019 10:45  US Venous Img Upper Uni Left  Result Date: 09/21/2019 CLINICAL DATA:  59 year old male with a history of arm pain for 2 weeks EXAM: LEFT UPPER EXTREMITY VENOUS DOPPLER ULTRASOUND TECHNIQUE: Gray-scale sonography with graded compression, as well as color Doppler and duplex ultrasound were performed to evaluate the upper extremity deep venous system from the level of the subclavian vein and including the jugular, axillary, basilic, radial, ulnar and upper cephalic vein. Spectral Doppler was utilized to evaluate flow at rest and with distal augmentation maneuvers. COMPARISON:  None. FINDINGS: Contralateral Subclavian Vein: Respiratory phasicity is normal and symmetric with the symptomatic side. No evidence of thrombus. Normal compressibility. Internal Jugular Vein: No evidence of thrombus. Normal compressibility, respiratory phasicity and response to augmentation. Subclavian Vein: No evidence of thrombus. Normal compressibility, respiratory phasicity and response to augmentation. Axillary Vein: No evidence of thrombus. Normal compressibility,  respiratory phasicity and response to augmentation. Cephalic Vein: No evidence of thrombus. Normal compressibility, respiratory phasicity and response to augmentation. Basilic Vein: No evidence of thrombus. Normal compressibility, respiratory phasicity and response to augmentation. Brachial Veins: No evidence of thrombus. Normal compressibility, respiratory phasicity and response to augmentation. Radial Veins: No evidence of thrombus. Normal compressibility, respiratory phasicity and response to augmentation. Ulnar Veins: No evidence of thrombus. Normal compressibility, respiratory phasicity and response to augmentation. Other Findings:  None visualized. IMPRESSION: Sonographic survey of the left upper extremity negative for DVT Electronically Signed   By: Corrie Mckusick D.O.   On: 09/21/2019 12:36   DG Humerus Left  Result Date: 09/21/2019 CLINICAL DATA:  Acute left arm pain after injury. EXAM: LEFT HUMERUS - 2+ VIEW COMPARISON:  None. FINDINGS: There is no evidence of fracture or other focal bone lesions. Soft tissues are unremarkable. IMPRESSION: Negative. Electronically Signed   By: Marijo Conception M.D.   On: 09/21/2019 10:44    ____________________________________________    PROCEDURES  Procedure(s) performed:    Procedures    Medications  oxyCODONE-acetaminophen (PERCOCET/ROXICET) 5-325 MG per tablet 1 tablet (1 tablet Oral Given 09/21/19 1107)     ____________________________________________   INITIAL IMPRESSION / ASSESSMENT AND PLAN / ED COURSE  Pertinent labs & imaging results that were available during my care of the patient were reviewed by me and considered in my medical decision making (see chart for details).  Review of the [place] CSRS was performed in accordance of the [place] prior to dispensing any controlled drugs.   Patient presents to emergency department for evaluation of worsening left arm pain for 2 weeks.  Vital signs and exam are reassuring.  Lab work largely  unremarkable.  Troponin not elevated above reference range.  Chest x-ray and humerus x-ray are negative for acute abnormalities.  Ultrasound negative for DVT.  Pain is likely musculoskeletal, as it is reproducible with palpation and with movement.  He likely has a muscle strain.  Patient will be discharged home with prescriptions for prednisone, Flexeril, Lidoderm. Patient is to follow up with primary care as directed. Patient is given ED precautions to return to the ED for any worsening or new symptoms.   Anthony Cohen was evaluated in Emergency Department on 09/21/2019 for the symptoms described in the history of present illness. He was evaluated in the context of the global COVID-19 pandemic, which necessitated consideration that the patient might be at risk for infection with the SARS-CoV-2 virus that causes COVID-19. Institutional protocols and algorithms that pertain to the evaluation of patients at risk for COVID-19 are in a state of rapid change  based on information released by regulatory bodies including the CDC and federal and state organizations. These policies and algorithms were followed during the patient's care in the ED.  ____________________________________________  FINAL CLINICAL IMPRESSION(S) / ED DIAGNOSES  Final diagnoses:  Left arm pain  Muscle strain      NEW MEDICATIONS STARTED DURING THIS VISIT:  ED Discharge Orders         Ordered    predniSONE (DELTASONE) 10 MG tablet     Discontinue  Reprint     09/21/19 1421    cyclobenzaprine (FLEXERIL) 5 MG tablet     Discontinue  Reprint     09/21/19 1421    lidocaine (LIDODERM) 5 %  Every 24 hours     Discontinue  Reprint     09/21/19 1421              This chart was dictated using voice recognition software/Dragon. Despite best efforts to proofread, errors can occur which can change the meaning. Any change was purely unintentional.    Laban Emperor, PA-C 09/21/19 1558    Blake Divine, MD 09/26/19  [phone]

## 2019-09-21 NOTE — ED Notes (Signed)
First Nurse Note: Pt c/o left arm numbness and pain. Pt states that he pulled or tore something. Pt states that he rolled over around 4 am and heard something pop, pt has been having pain and numbness since then.

## 2019-09-21 NOTE — Discharge Instructions (Addendum)
Your lab work and your EKG are all reassuring.  There is no blood clot on your ultrasound.  Your x-rays are normal.  I suspect that you have injured the muscle to your left upper arm. You can take steroids for inflammation and Flexeril to help relax your muscles and for pain.  Apply heat or ice over the area, whichever feels better.  Please follow-up with primary care.

## 2019-09-25 ENCOUNTER — Encounter: Payer: Self-pay | Admitting: Internal Medicine

## 2019-09-25 ENCOUNTER — Other Ambulatory Visit: Payer: Self-pay

## 2019-09-25 ENCOUNTER — Ambulatory Visit (INDEPENDENT_AMBULATORY_CARE_PROVIDER_SITE_OTHER): Payer: Self-pay | Admitting: Internal Medicine

## 2019-09-25 VITALS — BP 126/84 | HR 87 | Temp 98.9°F | Ht 71.0 in | Wt 261.0 lb

## 2019-09-25 DIAGNOSIS — F3341 Major depressive disorder, recurrent, in partial remission: Secondary | ICD-10-CM

## 2019-09-25 DIAGNOSIS — E1165 Type 2 diabetes mellitus with hyperglycemia: Secondary | ICD-10-CM

## 2019-09-25 DIAGNOSIS — G589 Mononeuropathy, unspecified: Secondary | ICD-10-CM

## 2019-09-25 MED ORDER — CYCLOBENZAPRINE HCL 10 MG PO TABS: ORAL_TABLET | ORAL | 0 refills | Status: DC

## 2019-09-25 NOTE — Patient Instructions (Signed)
Use Ice or Heat to the upper shoulder and neck three times a day for 30 min.  Resume Ibuprofen after prednisone is finished.

## 2019-09-25 NOTE — Progress Notes (Signed)
Date:  09/25/2019   Name:  Anthony Cohen   DOB:  [birth year]   MRN:  [id number]   Chief Complaint: Shoulder Pain (left shoulder, hurts to move, numbess in hands and wrist,ER said he pulled a muslce and nerve, painful to touch, muscle relaxers makes it better, X2 weeks been to ER, prednisone, lidocaine patch , cycloben. )  Shoulder Pain  The pain is present in the left shoulder. This is a new problem. The current episode started 1 to 4 weeks ago. The problem occurs daily. The problem has been gradually improving. The quality of the pain is described as aching and burning. Associated symptoms include numbness and tingling (in fingers). Pertinent negatives include no fever. Treatments tried: given prednisone taper, flexeril and lidoderm patch. The treatment provided significant (he is about 75% improved) relief.  Diabetes He presents for his follow-up diabetic visit. He has type 2 diabetes mellitus. His disease course has been stable. Pertinent negatives for hypoglycemia include no dizziness or tremors. Pertinent negatives for diabetes include no chest pain, no fatigue and no weakness. Current diabetic treatment includes diet. He is compliant with treatment most of the time. His weight is stable.  Depression        This is a chronic problem.The problem is unchanged.  Associated symptoms include no fatigue.     The symptoms are aggravated by nothing.  Past treatments include SNRIs - Serotonin and norepinephrine reuptake inhibitors and other medications.  Compliance with treatment is good.  Previous treatment provided significant relief.   Lab Results  Component Value Date   CREATININE 0.80 09/21/2019   BUN 16 09/21/2019   NA 139 09/21/2019   K 4.2 09/21/2019   CL 103 09/21/2019   CO2 25 09/21/2019    Lab Results  Component Value Date   CHOL 236 (H) 04/20/2016   HDL 37 (L) 04/20/2016   LDLCALC 146 (H) 04/20/2016   TRIG 264 (H) 04/20/2016   CHOLHDL 6.4 (H) 04/20/2016   Lab Results    Component Value Date   TSH 2.240 03/10/2015   Lab Results  Component Value Date   HGBA1C 7.1 (H) 05/26/2018   Lab Results  Component Value Date   WBC 5.9 09/21/2019   HGB 15.9 09/21/2019   HCT 45.7 09/21/2019   MCV 95.2 09/21/2019   PLT 217 09/21/2019   Lab Results  Component Value Date   ALT 31 05/26/2018   AST 28 05/26/2018   ALKPHOS 81 05/26/2018   BILITOT 0.4 05/26/2018     Review of Systems  Constitutional: Negative for chills, fatigue and fever.  Respiratory: Positive for chest tightness. Negative for cough, shortness of breath and wheezing.   Cardiovascular: Negative for chest pain and palpitations.  Musculoskeletal: Negative for arthralgias, gait problem and joint swelling.  Neurological: Positive for tingling (in fingers) and numbness. Negative for dizziness, tremors and weakness.  Psychiatric/Behavioral: Positive for depression.    Patient Active Problem List   Diagnosis Date Noted  . Pes planus, congenital 05/26/2018  . Restless leg syndrome 05/26/2018  . Elevated liver function tests 10/25/2016  . Arthritis of knee 10/25/2016  . GERD without esophagitis 04/20/2016  . Diabetes mellitus, type 2 ([place]) 06/16/2015  . OSA (obstructive sleep apnea) 03/10/2015  . Weight gain due to medication 03/10/2015  . Hyperlipidemia 03/10/2015  . Recurrent major depressive disorder, in partial remission ([place]) 02/19/2015  . Family history of digestive disorder 02/19/2015  . Tobacco use disorder, mild, in sustained remission 02/19/2015  Allergies  Allergen Reactions  . Atorvastatin Other (See Comments)    Transient elevation in LFTs ~2007  . Crestor [Rosuvastatin] Other (See Comments)    Elevated liver tests  . Vascepa [Icosapent Ethyl] Nausea Only    Past Surgical History:  Procedure Laterality Date  . NO PAST SURGERIES      Social History   Tobacco Use  . Smoking status: Former Smoker    Packs/day: 1.00    Years: 3.00    Pack years: 3.00    Types:  Cigarettes    Start date: 03/29/2015    Quit date: 03/29/2015    Years since quitting: 4.4  . Smokeless tobacco: Never Used  Vaping Use  . Vaping Use: Never used  Substance Use Topics  . Alcohol use: Yes    Alcohol/week: 0.0 standard drinks  . Drug use: No     Medication list has been reviewed and updated.  Current Meds  Medication Sig  . buPROPion (WELLBUTRIN XL) 150 MG 24 hr tablet TAKE THREE TABLETS BY MOUTH DAILY (Patient taking differently: Take 450 mg by mouth daily. )  . cyclobenzaprine (FLEXERIL) 5 MG tablet Take 1-2 tablets 3 times daily as needed  . gabapentin (NEURONTIN) 100 MG capsule TAKE ONE TO THREE CAPSULES BY MOUTH EVERY NIGHT AT BEDTIME (Patient taking differently: Take 100-300 mg by mouth at bedtime. )  . ibuprofen (ADVIL) 800 MG tablet Take 800 mg by mouth every 8 (eight) hours as needed.  . lidocaine (LIDODERM) 5 % Place 1 patch onto the skin daily. Remove & Discard patch within 12 hours or as directed by MD  . predniSONE (DELTASONE) 10 MG tablet Take 6 tablets on day 1, take 5 tablets on day 2, take 4 tablets on day 3, take 3 tablets on day 4, take 2 tablets on day 5, take 1 tablet on day 6  . venlafaxine XR (EFFEXOR-XR) 150 MG 24 hr capsule TAKE ONE CAPSULE BY MOUTH EVERY MORNING WITH BREAKFAST (Patient taking differently: Take 150 mg by mouth daily with breakfast. TAKE ONE CAPSULE BY MOUTH EVERY MORNING WITH BREAKFAST)    PHQ 2/9 Scores 09/25/2019 05/23/2019 09/06/2018 10/25/2016  PHQ - 2 Score 0 0 0 0  PHQ- 9 Score 0 0 0 0    GAD 7 : Generalized Anxiety Score 09/25/2019  Nervous, Anxious, on Edge 0  Control/stop worrying 0  Worry too much - different things 0  Trouble relaxing 0  Restless 0  Easily annoyed or irritable 0  Afraid - awful might happen 0  Total GAD 7 Score 0  Anxiety Difficulty Not difficult at all    BP Readings from Last 3 Encounters:  09/25/19 126/84  09/21/19 (!) 134/93  05/23/19 126/78    Physical Exam Vitals and nursing note reviewed.   Constitutional:      General: He is not in acute distress.    Appearance: He is well-developed.  HENT:     Head: Normocephalic and atraumatic.  Neck:     Vascular: No carotid bruit.  Cardiovascular:     Rate and Rhythm: Normal rate and regular rhythm.     Pulses: Normal pulses.     Heart sounds: No murmur heard.   Pulmonary:     Effort: Pulmonary effort is normal. No respiratory distress.     Breath sounds: No wheezing or rhonchi.  Musculoskeletal:     Right wrist: No swelling or deformity.     Left wrist: No swelling or deformity.     Cervical  back: No tenderness, bony tenderness or crepitus. Decreased range of motion.     Right lower leg: No edema.     Left lower leg: No edema.  Lymphadenopathy:     Cervical: No cervical adenopathy.  Skin:    General: Skin is warm and dry.     Findings: No rash.  Neurological:     Mental Status: He is alert and oriented to person, place, and time.     Sensory: Sensory deficit present.     Motor: Motor function is intact.     Coordination: Coordination is intact.     Comments: Grip 5/5  Psychiatric:        Attention and Perception: Attention and perception normal.        Behavior: Behavior normal.        Thought Content: Thought content normal.     Wt Readings from Last 3 Encounters:  09/25/19 261 lb (118.4 kg)  09/21/19 260 lb (117.9 kg)  05/23/19 263 lb (119.3 kg)    BP 126/84   Pulse 87   Temp 98.9 F (37.2 C) (Oral)   Ht 5\' 11"  (1.803 m)   Wt 261 lb (118.4 kg)   SpO2 95%   BMI 36.40 kg/m   Assessment and Plan: 1. Nerve compression syndrome Continue steroid taper and then begin ibuprofen Continue Lidoderm patch Expect improvement over the next few weeks If worsening, would recommend C-spine films - cyclobenzaprine (FLEXERIL) 10 MG tablet; Take 1 three times per day.  Dispense: 90 tablet; Refill: 0  2. Uncontrolled type 2 diabetes mellitus with hyperglycemia ([place]) Most recent random glucose was 134 His weight is  stable He declines labs at this time - will have insurance coverage beginning in March 2022  3. Recurrent major depressive disorder, in partial remission ([facility]) Clinically stable on current regimen with good control of symptoms, No SI or HI. Will continue current therapy with Effexor and Bupropion   Partially dictated using Editor, commissioning. Any errors are unintentional.  Halina Maidens, MD [facility]  09/25/2019

## 2019-10-01 ENCOUNTER — Ambulatory Visit: Payer: Self-pay | Admitting: Internal Medicine

## 2019-10-01 NOTE — Telephone Encounter (Signed)
Please advise 

## 2019-10-01 NOTE — Telephone Encounter (Signed)
Pt saw Dr. Army Melia 09/25/2019 for similar symptoms. States worsening since yesterday. Reports numbness and tingling now extends to elbow. States pain remains in shoulder and upper arm. States unable to sleep at night with pain. Also reports poor fine motor in hand. Completed course of prednisone and has been taking flexeril  as prescribed. NT called practice, [facility], who advised route to practice for PCP's review as pt was just seen for issue.  Pt voiced concern he does not have insurance.  Please advise: [phone]   Reason for Disposition . Numbness (i.e., loss of sensation) in hand or fingers  Answer Assessment - Initial Assessment Questions 1. ONSET: "When did the pain start?"   Please see triage summary 2. LOCATION: "Where is the pain located?"     *No Answer* 3. PAIN: "How bad is the pain?" (Scale 1-10; or mild, moderate, severe)   - MILD (1-3): doesn't interfere with normal activities   - MODERATE (4-7): interferes with normal activities (e.g., work or school) or awakens from sleep   - SEVERE (8-10): excruciating pain, unable to do any normal activities, unable to hold a cup of water     *No Answer* 4. WORK OR EXERCISE: "Has there been any recent work or exercise that involved this part of the body?"     *No Answer* 5. CAUSE: "What do you think is causing the arm pain?"     *No Answer* 6. OTHER SYMPTOMS: "Do you have any other symptoms?" (e.g., neck pain, swelling, rash, fever, numbness, weakness)     *No Answer* 7. PREGNANCY: "Is there any chance you are pregnant?" "When was your last menstrual period?"     *No Answer*  Protocols used: ARM PAIN-A-AH

## 2019-10-01 NOTE — Telephone Encounter (Signed)
Call dropped, pt hung up during transfer. Attempted to reach, left message to CB.

## 2019-10-01 NOTE — Telephone Encounter (Signed)
He needs to see Neurology and get nerve conduction studies.

## 2019-10-02 ENCOUNTER — Telehealth: Payer: Self-pay | Admitting: Internal Medicine

## 2019-10-02 NOTE — Telephone Encounter (Signed)
Pt called about the message he left for Dr. Army Melia / advised Pt of message about Neurology / Pt would like a call to discuss this and the referral / please advise

## 2019-10-02 NOTE — Telephone Encounter (Signed)
Called pt he told me that he has already got something set up with a neurologist. That he has called and set up a appt. To be seen.  KP

## 2019-10-02 NOTE — Telephone Encounter (Signed)
Called and spoke with patient. Recommended Anthony Cohen. Told pt since he does not have insurance he should not need a referral and can just call to schedule an appt to discuss his arm pain and discuss a nerve study. Told him to call back with any issues he may have.   CM

## 2019-10-18 ENCOUNTER — Other Ambulatory Visit: Payer: Self-pay | Admitting: Internal Medicine

## 2019-10-18 DIAGNOSIS — G589 Mononeuropathy, unspecified: Secondary | ICD-10-CM

## 2019-10-18 NOTE — Telephone Encounter (Signed)
cyclobenzaprine (FLEXERIL) 10 MG tablet     Patient is requesting a refill of this medication.    Pharmacy:  [facility] [address], [place] Phone:  [phone]  Fax:  [phone]

## 2019-10-18 NOTE — Telephone Encounter (Signed)
Requested medication (s) are due for refill today: no  Requested medication (s) are on the active medication list: yes  Last refill:  09/25/2019  Future visit scheduled: no  Notes to clinic:  this refill cannot be delegated   Requested Prescriptions  Pending Prescriptions Disp Refills   cyclobenzaprine (FLEXERIL) 10 MG tablet 90 tablet 0    Sig: Take 1 three times per day.      Not Delegated - Analgesics:  Muscle Relaxants Failed - 10/18/2019 11:30 AM      Failed - This refill cannot be delegated      Passed - Valid encounter within last 6 months    Recent Outpatient Visits           3 weeks ago Nerve compression syndrome   [facility] Glean Hess, MD   4 months ago Recurrent major depressive disorder, in partial remission [facility])   [facility] Glean Hess, MD   1 year ago Suspected Covid-19 Virus Infection   [facility] Glean Hess, MD   1 year ago Restless leg syndrome   [facility] Glean Hess, MD   2 years ago Controlled type 2 diabetes mellitus without complication, without long-term current use of insulin [facility])   [facility] Glean Hess, MD

## 2019-10-18 NOTE — Telephone Encounter (Signed)
Please Advise. Last office visit 09/25/2019. ° °KP

## 2019-10-18 NOTE — Telephone Encounter (Signed)
Requested medication (s) are due for refill today: yes  Requested medication (s) are on the active medication list: yes  Last refill:  09/25/19 #90 0 refills  Future visit scheduled: no  Notes to clinic:  not delegated per protocol     Requested Prescriptions  Pending Prescriptions Disp Refills   cyclobenzaprine (FLEXERIL) 10 MG tablet [Pharmacy Med Name: Cyclobenzaprine HCl 10 MG Oral Tablet] 90 tablet 0    Sig: TAKE 1 TABLET BY MOUTH THREE TIMES DAILY      Not Delegated - Analgesics:  Muscle Relaxants Failed - 10/18/2019  4:25 PM      Failed - This refill cannot be delegated      Passed - Valid encounter within last 6 months    Recent Outpatient Visits           3 weeks ago Nerve compression syndrome   [facility] Glean Hess, MD   4 months ago Recurrent major depressive disorder, in partial remission [facility])   [facility] Glean Hess, MD   1 year ago Suspected Covid-19 Virus Infection   [facility] Glean Hess, MD   1 year ago Restless leg syndrome   [facility] Glean Hess, MD   2 years ago Controlled type 2 diabetes mellitus without complication, without long-term current use of insulin [facility])   [facility] Glean Hess, MD

## 2019-10-24 ENCOUNTER — Other Ambulatory Visit: Payer: Self-pay | Admitting: Internal Medicine

## 2019-10-24 DIAGNOSIS — G589 Mononeuropathy, unspecified: Secondary | ICD-10-CM

## 2019-10-24 NOTE — Telephone Encounter (Signed)
Requested medication (s) are due for refill today: yes  Requested medication (s) are on the active medication list:yes  Last refill: 09/25/19  #90  0 refills  Future visit scheduled no  Notes to clinic: not delegated  Requested Prescriptions  Pending Prescriptions Disp Refills   cyclobenzaprine (FLEXERIL) 10 MG tablet [Pharmacy Med Name: Cyclobenzaprine HCl 10 MG Oral Tablet] 90 tablet 0    Sig: TAKE 1 TABLET BY MOUTH THREE TIMES DAILY      Not Delegated - Analgesics:  Muscle Relaxants Failed - 10/24/2019 11:52 AM      Failed - This refill cannot be delegated      Passed - Valid encounter within last 6 months    Recent Outpatient Visits           4 weeks ago Nerve compression syndrome   [facility] Glean Hess, MD   5 months ago Recurrent major depressive disorder, in partial remission [facility])   [facility] Glean Hess, MD   1 year ago Suspected Covid-19 Virus Infection   [facility] Glean Hess, MD   1 year ago Restless leg syndrome   [facility] Glean Hess, MD   2 years ago Controlled type 2 diabetes mellitus without complication, without long-term current use of insulin [facility])   [facility] Glean Hess, MD

## 2019-10-25 ENCOUNTER — Other Ambulatory Visit: Payer: Self-pay | Admitting: Internal Medicine

## 2019-10-25 DIAGNOSIS — G589 Mononeuropathy, unspecified: Secondary | ICD-10-CM

## 2019-11-07 ENCOUNTER — Other Ambulatory Visit: Payer: Self-pay

## 2019-11-07 ENCOUNTER — Ambulatory Visit (INDEPENDENT_AMBULATORY_CARE_PROVIDER_SITE_OTHER): Payer: Self-pay | Admitting: Internal Medicine

## 2019-11-07 ENCOUNTER — Ambulatory Visit
Admission: RE | Admit: 2019-11-07 | Discharge: 2019-11-07 | Disposition: A | Discharge status: Home / Self Care | Payer: Self-pay | Attending: Internal Medicine | Admitting: Internal Medicine

## 2019-11-07 ENCOUNTER — Encounter: Payer: Self-pay | Admitting: Internal Medicine

## 2019-11-07 ENCOUNTER — Ambulatory Visit
Admission: RE | Admit: 2019-11-07 | Discharge: 2019-11-07 | Disposition: A | Discharge status: Home / Self Care | Payer: Self-pay | Source: Ambulatory Visit | Attending: Internal Medicine | Admitting: Internal Medicine

## 2019-11-07 VITALS — BP 120/82 | HR 110 | Temp 100.1°F | Ht 71.0 in | Wt 267.0 lb

## 2019-11-07 DIAGNOSIS — M5412 Radiculopathy, cervical region: Secondary | ICD-10-CM

## 2019-11-07 DIAGNOSIS — G589 Mononeuropathy, unspecified: Secondary | ICD-10-CM

## 2019-11-07 DIAGNOSIS — F3341 Major depressive disorder, recurrent, in partial remission: Secondary | ICD-10-CM

## 2019-11-07 MED ORDER — CYCLOBENZAPRINE HCL 10 MG PO TABS: ORAL_TABLET | ORAL | 0 refills | Status: DC

## 2019-11-07 MED ORDER — BUPROPION HCL ER (XL) 150 MG PO TB24: 450.0000 mg | ORAL_TABLET | Freq: Every day | ORAL | 1 refills | Status: DC

## 2019-11-07 NOTE — Progress Notes (Signed)
Date:  11/07/2019   Name:  Anthony Cohen   DOB:  1960/11/23   MRN:  [id number]   Chief Complaint: Shoulder Pain (follow up [name] in alot of pain nothing is working/ left wrist is swollen and numb)  HPI He continues to have burning pain in his medial hand, wrist, under the armpit and scapula.  It is triggered at different times by various movements at which point it is severe.  He is having issues completing tasks around the house due to pain.  He denies weakness. He has been seeing a Restaurant manager, fast food but that has not been of any benefit. He has taken flexeril which seems to help.  Advil may be of some benefit.  He is already taking gabapentin 300 mg at bedtime for leg pain. He tried a steroid taper with no benefit. The initial presentation at [facility] included CXR and arm films, but no neck films.  Lab Results  Component Value Date   CREATININE 0.80 09/21/2019   BUN 16 09/21/2019   NA 139 09/21/2019   K 4.2 09/21/2019   CL 103 09/21/2019   CO2 25 09/21/2019   Lab Results  Component Value Date   CHOL 236 (H) 04/20/2016   HDL 37 (L) 04/20/2016   LDLCALC 146 (H) 04/20/2016   TRIG 264 (H) 04/20/2016   CHOLHDL 6.4 (H) 04/20/2016   Lab Results  Component Value Date   TSH 2.240 03/10/2015   Lab Results  Component Value Date   HGBA1C 7.1 (H) 05/26/2018   Lab Results  Component Value Date   WBC 5.9 09/21/2019   HGB 15.9 09/21/2019   HCT 45.7 09/21/2019   MCV 95.2 09/21/2019   PLT 217 09/21/2019   Lab Results  Component Value Date   ALT 31 05/26/2018   AST 28 05/26/2018   ALKPHOS 81 05/26/2018   BILITOT 0.4 05/26/2018     Review of Systems  Constitutional: Negative for appetite change, fatigue and fever.  Respiratory: Negative for chest tightness and shortness of breath.   Cardiovascular: Negative for chest pain and palpitations.  Musculoskeletal: Positive for arthralgias (swelling and sharp pains in left wrist). Negative for back pain.  Neurological: Positive for  numbness (in fingers of left hand). Negative for dizziness, weakness and headaches.    Patient Active Problem List   Diagnosis Date Noted  . Uncontrolled type 2 diabetes mellitus with hyperglycemia ([place]) 09/25/2019  . Pes planus, congenital 05/26/2018  . Restless leg syndrome 05/26/2018  . Elevated liver function tests 10/25/2016  . Arthritis of knee 10/25/2016  . GERD without esophagitis 04/20/2016  . Diabetes mellitus, type 2 ([place]) 06/16/2015  . OSA (obstructive sleep apnea) 03/10/2015  . Weight gain due to medication 03/10/2015  . Hyperlipidemia 03/10/2015  . Recurrent major depressive disorder, in partial remission ([place]) 02/19/2015  . Family history of digestive disorder 02/19/2015  . Tobacco use disorder, mild, in sustained remission 02/19/2015    Allergies  Allergen Reactions  . Atorvastatin Other (See Comments)    Transient elevation in LFTs ~2007  . Crestor [Rosuvastatin] Other (See Comments)    Elevated liver tests  . Vascepa [Icosapent Ethyl] Nausea Only    Past Surgical History:  Procedure Laterality Date  . NO PAST SURGERIES      Social History   Tobacco Use  . Smoking status: Former Smoker    Packs/day: 1.00    Years: 3.00    Pack years: 3.00    Types: Cigarettes    Start date: 03/29/2015  Quit date: 03/29/2015    Years since quitting: 4.6  . Smokeless tobacco: Never Used  Vaping Use  . Vaping Use: Never used  Substance Use Topics  . Alcohol use: Yes    Alcohol/week: 0.0 standard drinks  . Drug use: No     Medication list has been reviewed and updated.  Current Meds  Medication Sig  . buPROPion (WELLBUTRIN XL) 150 MG 24 hr tablet TAKE THREE TABLETS BY MOUTH DAILY (Patient taking differently: Take 450 mg by mouth daily. )  . cyclobenzaprine (FLEXERIL) 10 MG tablet Take 1 three times per day.  . gabapentin (NEURONTIN) 100 MG capsule TAKE ONE TO THREE CAPSULES BY MOUTH EVERY NIGHT AT BEDTIME (Patient taking differently: Take 100-300 mg by mouth at  bedtime. )  . venlafaxine XR (EFFEXOR-XR) 150 MG 24 hr capsule TAKE ONE CAPSULE BY MOUTH EVERY MORNING WITH BREAKFAST (Patient taking differently: Take 150 mg by mouth daily with breakfast. TAKE ONE CAPSULE BY MOUTH EVERY MORNING WITH BREAKFAST)    PHQ 2/9 Scores 11/07/2019 09/25/2019 05/23/2019 09/06/2018  PHQ - 2 Score 0 0 0 0  PHQ- 9 Score 0 0 0 0    GAD 7 : Generalized Anxiety Score 11/07/2019 09/25/2019  Nervous, Anxious, on Edge 0 0  Control/stop worrying 0 0  Worry too much - different things 0 0  Trouble relaxing 0 0  Restless 0 0  Easily annoyed or irritable 0 0  Afraid - awful might happen 0 0  Total GAD 7 Score 0 0  Anxiety Difficulty Not difficult at all Not difficult at all    BP Readings from Last 3 Encounters:  11/07/19 120/82  09/25/19 126/84  09/21/19 (!) 134/93    Physical Exam Vitals and nursing note reviewed.  Constitutional:      General: He is not in acute distress.    Appearance: He is well-developed.  HENT:     Head: Normocephalic and atraumatic.  Cardiovascular:     Rate and Rhythm: Normal rate and regular rhythm.  Pulmonary:     Effort: Pulmonary effort is normal. No respiratory distress.     Breath sounds: No wheezing or rhonchi.  Musculoskeletal:        General: Normal range of motion.     Left shoulder: Tenderness present. No crepitus. Normal range of motion. Normal strength.     Cervical back: Normal range of motion. Spasms and tenderness present. Pain with movement present.  Skin:    General: Skin is warm and dry.     Findings: No rash.  Neurological:     Mental Status: He is alert and oriented to person, place, and time.  Psychiatric:        Behavior: Behavior normal.        Thought Content: Thought content normal.     Wt Readings from Last 3 Encounters:  11/07/19 267 lb (121.1 kg)  09/25/19 261 lb (118.4 kg)  09/21/19 260 lb (117.9 kg)    BP 120/82   Pulse (!) 110   Temp 100.1 F (37.8 C) (Oral)   Ht 5\' 11"  (1.803 m)   Wt 267 lb  (121.1 kg)   SpO2 96%   BMI 37.24 kg/m   Assessment and Plan: 1. Cervical radiculopathy Suspect a C8 nerve root involvement Will get plain films. Continue flexeril tid; increase gabapentin to 300 mg tid Continue Advil, use heat/ice on lower neck - DG Cervical Spine Complete; Future  2. Nerve compression syndrome - cyclobenzaprine (FLEXERIL) 10 MG tablet; Take  1 three times per day.  Dispense: 90 tablet; Refill: 0  3. Recurrent major depressive disorder, in partial remission (HCC) - buPROPion (WELLBUTRIN XL) 150 MG 24 hr tablet; Take 3 tablets (450 mg total) by mouth daily.  Dispense: 450 tablet; Refill: 1   Partially dictated using Editor, commissioning. Any errors are unintentional.  Halina Maidens, MD [facility]  11/07/2019

## 2019-11-07 NOTE — Patient Instructions (Signed)
Increase gabapentin dose - titrate up gradually over 1-2 weeks to 300 mg three times per day

## 2019-11-27 ENCOUNTER — Ambulatory Visit: Payer: Self-pay | Admitting: Internal Medicine

## 2020-02-07 ENCOUNTER — Other Ambulatory Visit: Payer: Self-pay | Admitting: Internal Medicine

## 2020-02-07 DIAGNOSIS — F3341 Major depressive disorder, recurrent, in partial remission: Secondary | ICD-10-CM

## 2020-03-04 ENCOUNTER — Other Ambulatory Visit: Payer: Self-pay | Admitting: Internal Medicine

## 2020-03-04 DIAGNOSIS — G2581 Restless legs syndrome: Secondary | ICD-10-CM

## 2020-06-09 ENCOUNTER — Ambulatory Visit (INDEPENDENT_AMBULATORY_CARE_PROVIDER_SITE_OTHER): Payer: Medicare Other | Admitting: Internal Medicine

## 2020-06-09 ENCOUNTER — Encounter: Payer: Self-pay | Admitting: Internal Medicine

## 2020-06-09 ENCOUNTER — Other Ambulatory Visit: Payer: Self-pay

## 2020-06-09 VITALS — BP 104/68 | HR 85 | Ht 71.0 in | Wt 262.0 lb

## 2020-06-09 DIAGNOSIS — G2581 Restless legs syndrome: Secondary | ICD-10-CM | POA: Diagnosis not present

## 2020-06-09 DIAGNOSIS — E1169 Type 2 diabetes mellitus with other specified complication: Secondary | ICD-10-CM

## 2020-06-09 DIAGNOSIS — Q6650 Congenital pes planus, unspecified foot: Secondary | ICD-10-CM

## 2020-06-09 DIAGNOSIS — H918X3 Other specified hearing loss, bilateral: Secondary | ICD-10-CM

## 2020-06-09 DIAGNOSIS — F3341 Major depressive disorder, recurrent, in partial remission: Secondary | ICD-10-CM

## 2020-06-09 DIAGNOSIS — N529 Male erectile dysfunction, unspecified: Secondary | ICD-10-CM

## 2020-06-09 DIAGNOSIS — E118 Type 2 diabetes mellitus with unspecified complications: Secondary | ICD-10-CM | POA: Diagnosis not present

## 2020-06-09 DIAGNOSIS — E785 Hyperlipidemia, unspecified: Secondary | ICD-10-CM

## 2020-06-09 LAB — POCT UA - MICROALBUMIN: Microalbumin Ur, POC: 20 mg/L

## 2020-06-09 MED ORDER — SILDENAFIL CITRATE 20 MG PO TABS: 20.0000 mg | ORAL_TABLET | Freq: Every day | ORAL | 0 refills | Status: DC | PRN

## 2020-06-09 MED ORDER — BUPROPION HCL ER (XL) 150 MG PO TB24: 450.0000 mg | ORAL_TABLET | Freq: Every day | ORAL | 1 refills | Status: DC

## 2020-06-09 MED ORDER — VENLAFAXINE HCL ER 75 MG PO CP24: 225.0000 mg | ORAL_CAPSULE | Freq: Every day | ORAL | 1 refills | Status: DC

## 2020-06-09 NOTE — Progress Notes (Signed)
Date:  06/09/2020   Name:  Anthony Cohen   DOB:  Nov 04, 1960   MRN:  [id number]   Chief Complaint: Diabetes (MICRO, Foot Exam. Eye Exam. )  Diabetes He presents for his follow-up diabetic visit. He has type 2 diabetes mellitus. His disease course has been stable. Pertinent negatives for hypoglycemia include no dizziness, headaches or nervousness/anxiousness. Pertinent negatives for diabetes include no chest pain and no fatigue. Current diabetic treatment includes diet. There is no compliance with monitoring of blood glucose. An ACE inhibitor/angiotensin II receptor blocker is not being taken. Eye exam is not current.  Hyperlipidemia This is a chronic problem. The problem is uncontrolled. Exacerbating diseases include diabetes. He has no history of chronic renal disease. There are no known factors aggravating his hyperlipidemia. Pertinent negatives include no chest pain or shortness of breath. Current antihyperlipidemic treatment includes diet change.  Depression        This is a chronic problem.  The problem has been waxing and waning since onset.  Associated symptoms include irritable.  Associated symptoms include no fatigue, no headaches, not sad and no suicidal ideas.     The symptoms are aggravated by nothing.  Past treatments include SNRIs - Serotonin and norepinephrine reuptake inhibitors and other medications.  Compliance with treatment is good. Erectile Dysfunction This is a chronic problem. The problem is unchanged. The nature of his difficulty is achieving erection. He reports no anxiety. Pertinent negatives include no chills or hematuria. Nothing aggravates the symptoms. Past treatments include nothing.   Immunization History  Administered Date(s) Administered  . PFIZER Comirnaty([place])Covid-19 Tri-Sucrose Vaccine 05/21/2019, 06/10/2019, 02/20/2020    Lab Results  Component Value Date   CREATININE 0.80 09/21/2019   BUN 16 09/21/2019   NA 139 09/21/2019   K 4.2 09/21/2019    CL 103 09/21/2019   CO2 25 09/21/2019   Lab Results  Component Value Date   CHOL 236 (H) 04/20/2016   HDL 37 (L) 04/20/2016   LDLCALC 146 (H) 04/20/2016   TRIG 264 (H) 04/20/2016   CHOLHDL 6.4 (H) 04/20/2016   Lab Results  Component Value Date   TSH 2.240 03/10/2015   Lab Results  Component Value Date   HGBA1C 7.1 (H) 05/26/2018   Lab Results  Component Value Date   WBC 5.9 09/21/2019   HGB 15.9 09/21/2019   HCT 45.7 09/21/2019   MCV 95.2 09/21/2019   PLT 217 09/21/2019   Lab Results  Component Value Date   ALT 31 05/26/2018   AST 28 05/26/2018   ALKPHOS 81 05/26/2018   BILITOT 0.4 05/26/2018     Review of Systems  Constitutional: Positive for unexpected weight change (has lost weight with effort). Negative for chills, fatigue and fever.  HENT: Positive for hearing loss.   Respiratory: Negative for chest tightness, shortness of breath and wheezing.   Cardiovascular: Negative for chest pain, palpitations and leg swelling.  Gastrointestinal: Negative for abdominal pain.  Genitourinary: Negative for hematuria.  Musculoskeletal: Positive for arthralgias and gait problem.  Neurological: Negative for dizziness, light-headedness and headaches.  Psychiatric/Behavioral: Positive for depression and dysphoric mood. Negative for suicidal ideas. The patient is not nervous/anxious.     Patient Active Problem List   Diagnosis Date Noted  . Pinched nerve in shoulder, left 09/11/2019  . Pes planus, congenital 05/26/2018  . Restless leg syndrome 05/26/2018  . Elevated liver function tests 10/25/2016  . Arthritis of knee 10/25/2016  . GERD without esophagitis 04/20/2016  . Type II  diabetes mellitus with complication ([place]) 28/31/5176  . Hyperlipidemia associated with type 2 diabetes mellitus ([place]) 06/16/2015  . OSA (obstructive sleep apnea) 03/10/2015  . Weight gain due to medication 03/10/2015  . Recurrent major depressive disorder, in partial remission ([place]) 02/19/2015   . Family history of digestive disorder 02/19/2015  . Tobacco use disorder, mild, in sustained remission 02/19/2015    Allergies  Allergen Reactions  . Atorvastatin Other (See Comments)    Transient elevation in LFTs ~2007  . Crestor [Rosuvastatin] Other (See Comments)    Elevated liver tests  . Vascepa [Icosapent Ethyl] Nausea Only    Past Surgical History:  Procedure Laterality Date  . NO PAST SURGERIES      Social History   Tobacco Use  . Smoking status: Former Smoker    Packs/day: 1.00    Years: 3.00    Pack years: 3.00    Types: Cigarettes    Start date: 03/29/2015    Quit date: 03/29/2015    Years since quitting: 5.2  . Smokeless tobacco: Never Used  Vaping Use  . Vaping Use: Never used  Substance Use Topics  . Alcohol use: Yes    Alcohol/week: 0.0 standard drinks  . Drug use: No     Medication list has been reviewed and updated.  Current Meds  Medication Sig  . buPROPion (WELLBUTRIN XL) 150 MG 24 hr tablet Take 3 tablets (450 mg total) by mouth daily.  Marland Kitchen gabapentin (NEURONTIN) 100 MG capsule TAKE ONE TO THREE CAPSULES BY MOUTH EVERY NIGHT AT BEDTIME (Patient taking differently: Take 300 mg by mouth at bedtime.)  . venlafaxine XR (EFFEXOR-XR) 150 MG 24 hr capsule TAKE ONE CAPSULE BY MOUTH EVERY MORNING WITH BREAKFAST    PHQ 2/9 Scores 06/09/2020 11/07/2019 09/25/2019 05/23/2019  PHQ - 2 Score 0 0 0 0  PHQ- 9 Score 0 0 0 0    GAD 7 : Generalized Anxiety Score 06/09/2020 11/07/2019 09/25/2019  Nervous, Anxious, on Edge 0 0 0  Control/stop worrying 0 0 0  Worry too much - different things 0 0 0  Trouble relaxing 0 0 0  Restless 0 0 0  Easily annoyed or irritable 0 0 0  Afraid - awful might happen 0 0 0  Total GAD 7 Score 0 0 0  Anxiety Difficulty Not difficult at all Not difficult at all Not difficult at all    BP Readings from Last 3 Encounters:  06/09/20 104/68  11/07/19 120/82  09/25/19 126/84    Physical Exam Vitals and nursing note reviewed.   Constitutional:      General: He is irritable. He is not in acute distress.    Appearance: Normal appearance. He is well-developed.  HENT:     Head: Normocephalic and atraumatic.  Cardiovascular:     Rate and Rhythm: Normal rate and regular rhythm.     Pulses: Normal pulses.     Heart sounds: No murmur heard.   Pulmonary:     Effort: Pulmonary effort is normal. No respiratory distress.     Breath sounds: No wheezing or rhonchi.  Musculoskeletal:        General: Tenderness and deformity present.     Cervical back: Normal range of motion.     Right knee: Bony tenderness present. No effusion. Decreased range of motion.     Left knee: Bony tenderness present. No effusion. Decreased range of motion.     Right lower leg: No edema.     Left lower leg: No edema.  Lymphadenopathy:     Cervical: No cervical adenopathy.  Skin:    General: Skin is warm and dry.     Capillary Refill: Capillary refill takes less than 2 seconds.     Findings: No rash.  Neurological:     General: No focal deficit present.     Mental Status: He is alert and oriented to person, place, and time.  Psychiatric:        Mood and Affect: Mood normal.        Behavior: Behavior normal.     Wt Readings from Last 3 Encounters:  06/09/20 262 lb (118.8 kg)  11/07/19 267 lb (121.1 kg)  09/25/19 261 lb (118.4 kg)    BP 104/68   Pulse 85   Ht 5\' 11"  (1.803 m)   Wt 262 lb (118.8 kg)   SpO2 95%   BMI 36.54 kg/m   Assessment and Plan: 1. Type II diabetes mellitus with complication (HCC) BS has been controlled with diet Reminded to schedule eye exam He declines PPV-13 today - Comprehensive metabolic panel - Hemoglobin A1c - POCT UA - Microalbumin  2. Recurrent major depressive disorder, in partial remission (HCC) Some increase in irritability lately - despite good compliance with current medications Will increase Venlafaxine to 225 mg per day Reassess next visit - venlafaxine XR (EFFEXOR-XR) 75 MG 24 hr  capsule; Take 3 capsules (225 mg total) by mouth daily with breakfast.  Dispense: 270 capsule; Refill: 1 - buPROPion (WELLBUTRIN XL) 150 MG 24 hr tablet; Take 3 tablets (450 mg total) by mouth daily.  Dispense: 450 tablet; Refill: 1  3. Restless leg syndrome stable - CBC with Differential/Platelet  4. Hyperlipidemia associated with type 2 diabetes mellitus ([place]) Not currently on statin therapy - previous intolerance to crestor, lipitor and vascepa - Lipid panel  5. Erectile dysfunction, unspecified erectile dysfunction type 20-60 mg once a day prn Pt to fill using Good Rx - sildenafil (REVATIO) 20 MG tablet; Take 1-3 tablets (20-60 mg total) by mouth daily as needed.  Dispense: 30 tablet; Refill: 0  6. Other specified hearing loss of both ears - Ambulatory referral to ENT  7. Congenital pes planus, unspecified laterality Chronic stable foot pain and knee pain likely related to abnormal gait Consider podiatry and/or Ortho or SM referral - pt declines at this time.   Partially dictated using Editor, commissioning. Any errors are unintentional.  Halina Maidens, MD [facility]  06/09/2020

## 2020-06-10 ENCOUNTER — Other Ambulatory Visit: Payer: Self-pay

## 2020-06-10 DIAGNOSIS — E1169 Type 2 diabetes mellitus with other specified complication: Secondary | ICD-10-CM

## 2020-06-10 DIAGNOSIS — E785 Hyperlipidemia, unspecified: Secondary | ICD-10-CM

## 2020-06-10 LAB — CBC WITH DIFFERENTIAL/PLATELET
Basophils Absolute: 0.1 10*3/uL (ref 0.0–0.2)
Basos: 1 %
EOS (ABSOLUTE): 0.2 10*3/uL (ref 0.0–0.4)
Eos: 3 %
Hematocrit: 47.9 % (ref 37.5–51.0)
Hemoglobin: 16.1 g/dL (ref 13.0–17.7)
Immature Grans (Abs): 0 10*3/uL (ref 0.0–0.1)
Immature Granulocytes: 0 %
Lymphocytes Absolute: 2.3 10*3/uL (ref 0.7–3.1)
Lymphs: 40 %
MCH: 32.1 pg (ref 26.6–33.0)
MCHC: 33.6 g/dL (ref 31.5–35.7)
MCV: 95 fL (ref 79–97)
Monocytes Absolute: 0.5 10*3/uL (ref 0.1–0.9)
Monocytes: 9 %
Neutrophils Absolute: 2.7 10*3/uL (ref 1.4–7.0)
Neutrophils: 47 %
Platelets: 248 10*3/uL (ref 150–450)
RBC: 5.02 x10E6/uL (ref 4.14–5.80)
RDW: 12.1 % (ref 11.6–15.4)
WBC: 5.8 10*3/uL (ref 3.4–10.8)

## 2020-06-10 LAB — COMPREHENSIVE METABOLIC PANEL
ALT: 38 IU/L (ref 0–44)
AST: 32 IU/L (ref 0–40)
Albumin/Globulin Ratio: 1.7 (ref 1.2–2.2)
Albumin: 4.6 g/dL (ref 3.8–4.9)
Alkaline Phosphatase: 85 IU/L (ref 44–121)
BUN/Creatinine Ratio: 22 (ref 10–24)
BUN: 20 mg/dL (ref 8–27)
Bilirubin Total: 0.5 mg/dL (ref 0.0–1.2)
CO2: 25 mmol/L (ref 20–29)
Calcium: 10 mg/dL (ref 8.6–10.2)
Chloride: 102 mmol/L (ref 96–106)
Creatinine, Ser: 0.9 mg/dL (ref 0.76–1.27)
Globulin, Total: 2.7 g/dL (ref 1.5–4.5)
Glucose: 89 mg/dL (ref 65–99)
Potassium: 5.1 mmol/L (ref 3.5–5.2)
Sodium: 140 mmol/L (ref 134–144)
Total Protein: 7.3 g/dL (ref 6.0–8.5)
eGFR: 98 mL/min/{1.73_m2} (ref >59)

## 2020-06-10 LAB — HEMOGLOBIN A1C
Est. average glucose Bld gHb Est-mCnc: 140 mg/dL
Hgb A1c MFr Bld: 6.5 % — ABNORMAL HIGH (ref 4.8–5.6)

## 2020-06-10 LAB — LIPID PANEL
Chol/HDL Ratio: 7.2 ratio — ABNORMAL HIGH (ref 0.0–5.0)
Cholesterol, Total: 266 mg/dL — ABNORMAL HIGH (ref 100–199)
HDL: 37 mg/dL — ABNORMAL LOW (ref >39)
LDL Chol Calc (NIH): 163 mg/dL — ABNORMAL HIGH (ref 0–99)
Triglycerides: 344 mg/dL — ABNORMAL HIGH (ref 0–149)
VLDL Cholesterol Cal: 66 mg/dL — ABNORMAL HIGH (ref 5–40)

## 2020-06-10 MED ORDER — EZETIMIBE 10 MG PO TABS: 10.0000 mg | ORAL_TABLET | Freq: Every day | ORAL | 1 refills | Status: DC

## 2020-06-10 MED ORDER — EZETIMIBE 10 MG PO TABS: 10.0000 mg | ORAL_TABLET | Freq: Every day | ORAL | 3 refills | Status: DC

## 2020-06-16 ENCOUNTER — Other Ambulatory Visit: Payer: Self-pay | Admitting: Internal Medicine

## 2020-06-16 DIAGNOSIS — N529 Male erectile dysfunction, unspecified: Secondary | ICD-10-CM

## 2020-07-03 ENCOUNTER — Other Ambulatory Visit: Payer: Self-pay

## 2020-07-03 ENCOUNTER — Ambulatory Visit (INDEPENDENT_AMBULATORY_CARE_PROVIDER_SITE_OTHER): Payer: Medicare Other | Admitting: Internal Medicine

## 2020-07-03 ENCOUNTER — Encounter: Payer: Self-pay | Admitting: Internal Medicine

## 2020-07-03 VITALS — BP 106/72 | HR 76 | Temp 98.9°F | Ht 71.0 in | Wt 255.0 lb

## 2020-07-03 DIAGNOSIS — N529 Male erectile dysfunction, unspecified: Secondary | ICD-10-CM

## 2020-07-03 DIAGNOSIS — G2581 Restless legs syndrome: Secondary | ICD-10-CM

## 2020-07-03 MED ORDER — SILDENAFIL CITRATE 20 MG PO TABS: 60.0000 mg | ORAL_TABLET | Freq: Every day | ORAL | 0 refills | Status: DC | PRN

## 2020-07-03 MED ORDER — GABAPENTIN 300 MG PO CAPS: 300.0000 mg | ORAL_CAPSULE | Freq: Three times a day (TID) | ORAL | 0 refills | Status: DC

## 2020-07-03 NOTE — Patient Instructions (Signed)
Increase the gabapentin to 100, 100, 200 for several days; then 100, 200, 200 for several days then 200, 200, 200 for several days then start the new Rx for 300 three times a day.

## 2020-07-03 NOTE — Progress Notes (Signed)
Date:  07/03/2020   Name:  Anthony Cohen   DOB:  21-Aug-1960   MRN:  [id number]   Chief Complaint: restless leg (Ongoing problem, getting worst X1 month, both legs, foot exam, cant sit still feels like ants are crawling on legs  )  HPI Restless leg syndrome - he is having more restless leg symptoms in the evening.  He feels like ants are crawling on his feet and lower legs and has to move and stretch to get any relief. He is already taking gabapentin 100 mg tid.  ED - he tried sildenafil up to 60 mg with no benefit, other than a few spontaneous AM erections.  Lab Results  Component Value Date   CREATININE 0.90 06/09/2020   BUN 20 06/09/2020   NA 140 06/09/2020   K 5.1 06/09/2020   CL 102 06/09/2020   CO2 25 06/09/2020   Lab Results  Component Value Date   CHOL 266 (H) 06/09/2020   HDL 37 (L) 06/09/2020   LDLCALC 163 (H) 06/09/2020   TRIG 344 (H) 06/09/2020   CHOLHDL 7.2 (H) 06/09/2020   Lab Results  Component Value Date   TSH 2.240 03/10/2015   Lab Results  Component Value Date   HGBA1C 6.5 (H) 06/09/2020   Lab Results  Component Value Date   WBC 5.8 06/09/2020   HGB 16.1 06/09/2020   HCT 47.9 06/09/2020   MCV 95 06/09/2020   PLT 248 06/09/2020   Lab Results  Component Value Date   ALT 38 06/09/2020   AST 32 06/09/2020   ALKPHOS 85 06/09/2020   BILITOT 0.5 06/09/2020     Review of Systems  Constitutional: Positive for unexpected weight change (working on weight loss - down 12 lbs). Negative for chills and fatigue.  Respiratory: Negative for chest tightness and shortness of breath.   Cardiovascular: Negative for chest pain and leg swelling.  Musculoskeletal: Positive for arthralgias and myalgias.  Neurological: Negative for dizziness and headaches.  Psychiatric/Behavioral: Negative for dysphoric mood. The patient is not nervous/anxious.     Patient Active Problem List   Diagnosis Date Noted  . Erectile dysfunction 06/09/2020  . Pinched nerve in  shoulder, left 09/11/2019  . Pes planus, congenital 05/26/2018  . Restless leg syndrome 05/26/2018  . Elevated liver function tests 10/25/2016  . Arthritis of knee 10/25/2016  . GERD without esophagitis 04/20/2016  . Type II diabetes mellitus with complication ([place]) 78/29/5621  . Hyperlipidemia associated with type 2 diabetes mellitus ([place]) 06/16/2015  . OSA (obstructive sleep apnea) 03/10/2015  . Weight gain due to medication 03/10/2015  . Recurrent major depressive disorder, in partial remission ([place]) 02/19/2015  . Family history of digestive disorder 02/19/2015  . Tobacco use disorder, mild, in sustained remission 02/19/2015    Allergies  Allergen Reactions  . Atorvastatin Other (See Comments)    Transient elevation in LFTs ~2007  . Crestor [Rosuvastatin] Other (See Comments)    Elevated liver tests  . Vascepa [Icosapent Ethyl] Nausea Only    Past Surgical History:  Procedure Laterality Date  . NO PAST SURGERIES      Social History   Tobacco Use  . Smoking status: Former Smoker    Packs/day: 1.00    Years: 3.00    Pack years: 3.00    Types: Cigarettes    Start date: 03/29/2015    Quit date: 03/29/2015    Years since quitting: 5.2  . Smokeless tobacco: Never Used  Vaping Use  . Vaping  Use: Never used  Substance Use Topics  . Alcohol use: Yes    Alcohol/week: 0.0 standard drinks  . Drug use: No     Medication list has been reviewed and updated.  Current Meds  Medication Sig  . buPROPion (WELLBUTRIN XL) 150 MG 24 hr tablet Take 3 tablets (450 mg total) by mouth daily.  Marland Kitchen ezetimibe (ZETIA) 10 MG tablet Take 1 tablet (10 mg total) by mouth daily.  Marland Kitchen gabapentin (NEURONTIN) 100 MG capsule TAKE ONE TO THREE CAPSULES BY MOUTH EVERY NIGHT AT BEDTIME (Patient taking differently: Take 300 mg by mouth at bedtime.)  . sildenafil (REVATIO) 20 MG tablet Take 1-3 tablets (20-60 mg total) by mouth daily as needed.  . venlafaxine XR (EFFEXOR-XR) 75 MG 24 hr capsule Take 3  capsules (225 mg total) by mouth daily with breakfast.    PHQ 2/9 Scores 07/03/2020 06/09/2020 11/07/2019 09/25/2019  PHQ - 2 Score 0 0 0 0  PHQ- 9 Score 0 0 0 0    GAD 7 : Generalized Anxiety Score 07/03/2020 06/09/2020 11/07/2019 09/25/2019  Nervous, Anxious, on Edge 0 0 0 0  Control/stop worrying 0 0 0 0  Worry too much - different things 0 0 0 0  Trouble relaxing 0 0 0 0  Restless 0 0 0 0  Easily annoyed or irritable 0 0 0 0  Afraid - awful might happen 0 0 0 0  Total GAD 7 Score 0 0 0 0  Anxiety Difficulty - Not difficult at all Not difficult at all Not difficult at all    BP Readings from Last 3 Encounters:  07/03/20 106/72  06/09/20 104/68  11/07/19 120/82    Physical Exam Vitals and nursing note reviewed.  Constitutional:      General: He is not in acute distress.    Appearance: Normal appearance. He is well-developed.  HENT:     Head: Normocephalic and atraumatic.  Cardiovascular:     Rate and Rhythm: Normal rate and regular rhythm.  Pulmonary:     Effort: Pulmonary effort is normal. No respiratory distress.     Breath sounds: No wheezing or rhonchi.  Musculoskeletal:        General: Deformity present.     Right lower leg: No edema.     Left lower leg: No edema.  Skin:    General: Skin is warm and dry.     Findings: No rash.  Neurological:     General: No focal deficit present.     Mental Status: He is alert and oriented to person, place, and time.  Psychiatric:        Mood and Affect: Mood normal.        Behavior: Behavior normal.     Wt Readings from Last 3 Encounters:  07/03/20 255 lb (115.7 kg)  06/09/20 262 lb (118.8 kg)  11/07/19 267 lb (121.1 kg)    BP 106/72   Pulse 76   Temp 98.9 F (37.2 C) (Oral)   Ht 5\' 11"  (1.803 m)   Wt 255 lb (115.7 kg)   SpO2 96%   BMI 35.57 kg/m   Assessment and Plan: 1. Restless leg syndrome Increase the gabapentin gradually to effect OR 300 mg tid OR sedation - gabapentin (NEURONTIN) 300 MG capsule; Take 1  capsule (300 mg total) by mouth 3 (three) times daily.  Dispense: 270 capsule; Refill: 0  2. Erectile dysfunction, unspecified erectile dysfunction type Try increasing to 100 mg If no benefit, will discuss referral to  Urology - sildenafil (REVATIO) 20 MG tablet; Take 3-5 tablets (60-100 mg total) by mouth daily as needed.  Dispense: 30 tablet; Refill: 0   Partially dictated using Editor, commissioning. Any errors are unintentional.  Halina Maidens, MD [facility]  07/03/2020

## 2020-07-05 ENCOUNTER — Other Ambulatory Visit: Payer: Self-pay | Admitting: Internal Medicine

## 2020-07-05 DIAGNOSIS — N529 Male erectile dysfunction, unspecified: Secondary | ICD-10-CM

## 2020-08-04 ENCOUNTER — Other Ambulatory Visit: Payer: Self-pay | Admitting: Internal Medicine

## 2020-08-04 DIAGNOSIS — N529 Male erectile dysfunction, unspecified: Secondary | ICD-10-CM

## 2020-08-04 MED ORDER — SILDENAFIL CITRATE 20 MG PO TABS: 60.0000 mg | ORAL_TABLET | Freq: Every day | ORAL | 0 refills | Status: DC | PRN

## 2020-08-04 NOTE — Telephone Encounter (Signed)
Medication Refill - Medication:   sildenafil (REVATIO) 20 MG tablet   Has the patient contacted their pharmacy? Yes.  no refills   Preferred Pharmacy (with phone number or street name):  [facility] [place] [facility]  [address] [place] [address]  Phone:  [phone] Fax:  [phone]   Agent: Please be advised that RX refills may take up to 3 business days. We ask that you follow-up with your pharmacy.

## 2020-08-08 ENCOUNTER — Other Ambulatory Visit: Payer: Self-pay | Admitting: Internal Medicine

## 2020-08-08 DIAGNOSIS — N529 Male erectile dysfunction, unspecified: Secondary | ICD-10-CM

## 2020-08-08 NOTE — Telephone Encounter (Signed)
Requested Prescriptions  Pending Prescriptions Disp Refills  . sildenafil (REVATIO) 20 MG tablet [Pharmacy Med Name: SILDENAFIL 20 MG TABLET] 30 tablet 0    Sig: TAKE 3-5 TABLETS BY MOUTH DAILY AS NEEDED     Urology: Erectile Dysfunction Agents Passed - 08/08/2020  6:22 AM      Passed - Last BP in normal range    BP Readings from Last 1 Encounters:  07/03/20 106/72         Passed - Valid encounter within last 12 months    Recent Outpatient Visits          1 month ago Restless leg syndrome   [facility] Glean Hess, MD   2 months ago Type II diabetes mellitus with complication [facility])   [facility] Glean Hess, MD   9 months ago Cervical radiculopathy   [facility] Glean Hess, MD   10 months ago Nerve compression syndrome   [facility] Glean Hess, MD   1 year ago Recurrent major depressive disorder, in partial remission [facility])   [facility] Glean Hess, MD      Future Appointments            In 2 months Army Melia Jesse Sans, MD [facility], [facility]

## 2020-08-15 ENCOUNTER — Other Ambulatory Visit: Payer: Self-pay | Admitting: Internal Medicine

## 2020-08-15 DIAGNOSIS — N529 Male erectile dysfunction, unspecified: Secondary | ICD-10-CM

## 2020-08-15 NOTE — Telephone Encounter (Signed)
Requested medications are due for refill today ??  Requested medications are on the active medication list yes  Last refill 7 days ago  Last visit 4/14  Future visit scheduled 10/07/20  Notes to clinic just filled 7 days ago, however, sig allows up to 5 a day, please assess.

## 2020-08-30 ENCOUNTER — Other Ambulatory Visit: Payer: Self-pay | Admitting: Internal Medicine

## 2020-08-30 DIAGNOSIS — G2581 Restless legs syndrome: Secondary | ICD-10-CM

## 2020-09-15 ENCOUNTER — Ambulatory Visit (INDEPENDENT_AMBULATORY_CARE_PROVIDER_SITE_OTHER): Payer: Medicare Other | Admitting: Internal Medicine

## 2020-09-15 ENCOUNTER — Other Ambulatory Visit: Payer: Self-pay

## 2020-09-15 ENCOUNTER — Encounter: Payer: Self-pay | Admitting: Internal Medicine

## 2020-09-15 VITALS — BP 132/70 | HR 76 | Temp 99.2°F | Ht 71.0 in | Wt 252.0 lb

## 2020-09-15 DIAGNOSIS — E785 Hyperlipidemia, unspecified: Secondary | ICD-10-CM

## 2020-09-15 DIAGNOSIS — E118 Type 2 diabetes mellitus with unspecified complications: Secondary | ICD-10-CM | POA: Diagnosis not present

## 2020-09-15 DIAGNOSIS — E1169 Type 2 diabetes mellitus with other specified complication: Secondary | ICD-10-CM

## 2020-09-15 DIAGNOSIS — N529 Male erectile dysfunction, unspecified: Secondary | ICD-10-CM

## 2020-09-15 DIAGNOSIS — L0291 Cutaneous abscess, unspecified: Secondary | ICD-10-CM

## 2020-09-15 MED ORDER — SILDENAFIL CITRATE 20 MG PO TABS: 20.0000 mg | ORAL_TABLET | Freq: Every day | ORAL | 2 refills | Status: DC

## 2020-09-15 MED ORDER — MUPIROCIN CALCIUM 2 % EX CREA: 1.0000 "application " | TOPICAL_CREAM | Freq: Two times a day (BID) | CUTANEOUS | 2 refills | Status: DC

## 2020-09-15 NOTE — Progress Notes (Signed)
Date:  09/15/2020   Name:  YOUSSEF Cohen   DOB:  1960/05/01   MRN:  [id number]   Chief Complaint: Cyst (Patient c/o cyst on leg., boils, both legs, some drain and some don't, getting bigger )  Diabetes He presents for his follow-up diabetic visit. He has type 2 diabetes mellitus. His disease course has been stable. There are no hypoglycemic associated symptoms. Pertinent negatives for hypoglycemia include no dizziness, headaches or nervousness/anxiousness. Pertinent negatives for diabetes include no chest pain, no fatigue, no foot ulcerations, no visual change and no weakness. Symptoms are stable. Diabetic complications include impotence. Pertinent negatives for diabetic complications include no heart disease or nephropathy. Current diabetic treatment includes diet. He is compliant with treatment most of the time. His weight is decreasing steadily. There is no compliance with monitoring of blood glucose. An ACE inhibitor/angiotensin II receptor blocker is not being taken.  Hyperlipidemia This is a chronic problem. Pertinent negatives include no chest pain or shortness of breath. Current antihyperlipidemic treatment includes ezetimibe (zetia started last visit - no side effects).  Recurrent boils - He gets recurrent abscesses inner thighs, buttocks but more recently on lower leg.  On his left buttock the lesion finally drained and is healing.   Lab Results  Component Value Date   CREATININE 0.90 06/09/2020   BUN 20 06/09/2020   NA 140 06/09/2020   K 5.1 06/09/2020   CL 102 06/09/2020   CO2 25 06/09/2020   Lab Results  Component Value Date   CHOL 266 (H) 06/09/2020   HDL 37 (L) 06/09/2020   LDLCALC 163 (H) 06/09/2020   TRIG 344 (H) 06/09/2020   CHOLHDL 7.2 (H) 06/09/2020   Lab Results  Component Value Date   TSH 2.240 03/10/2015   Lab Results  Component Value Date   HGBA1C 6.5 (H) 06/09/2020   Lab Results  Component Value Date   WBC 5.8 06/09/2020   HGB 16.1 06/09/2020    HCT 47.9 06/09/2020   MCV 95 06/09/2020   PLT 248 06/09/2020   Lab Results  Component Value Date   ALT 38 06/09/2020   AST 32 06/09/2020   ALKPHOS 85 06/09/2020   BILITOT 0.5 06/09/2020     Review of Systems  Constitutional:  Negative for chills, diaphoresis, fatigue and unexpected weight change.  Eyes:  Negative for visual disturbance.  Respiratory:  Negative for chest tightness and shortness of breath.   Cardiovascular:  Negative for chest pain, palpitations and leg swelling.  Gastrointestinal:  Negative for abdominal pain, constipation and diarrhea.  Genitourinary:  Positive for impotence.  Musculoskeletal:  Positive for arthralgias and gait problem.  Skin:        Recurrent boils   Neurological:  Negative for dizziness, weakness and headaches.  Psychiatric/Behavioral:  Negative for dysphoric mood and sleep disturbance. The patient is not nervous/anxious.    Patient Active Problem List   Diagnosis Date Noted   Erectile dysfunction 06/09/2020   Pinched nerve in shoulder, left 09/11/2019   Pes planus, congenital 05/26/2018   Restless leg syndrome 05/26/2018   Elevated liver function tests 10/25/2016   Arthritis of knee 10/25/2016   GERD without esophagitis 04/20/2016   Type II diabetes mellitus with complication ([place]) 92/42/6834   Hyperlipidemia associated with type 2 diabetes mellitus ([place]) 06/16/2015   OSA (obstructive sleep apnea) 03/10/2015   Weight gain due to medication 03/10/2015   Recurrent major depressive disorder, in partial remission ([place]) 02/19/2015   Family history of digestive disorder 02/19/2015  Tobacco use disorder, mild, in sustained remission 02/19/2015    Allergies  Allergen Reactions   Atorvastatin Other (See Comments)    Transient elevation in LFTs ~2007   Crestor [Rosuvastatin] Other (See Comments)    Elevated liver tests    Past Surgical History:  Procedure Laterality Date   NO PAST SURGERIES      Social History   Tobacco Use    Smoking status: Former    Packs/day: 1.00    Years: 3.00    Pack years: 3.00    Types: Cigarettes    Start date: 03/28/2015    Quit date: 03/29/2015    Years since quitting: 5.4   Smokeless tobacco: Never  Vaping Use   Vaping Use: Never used  Substance Use Topics   Alcohol use: Yes    Alcohol/week: 0.0 standard drinks   Drug use: No     Medication list has been reviewed and updated.  Current Meds  Medication Sig   buPROPion (WELLBUTRIN XL) 150 MG 24 hr tablet Take 3 tablets (450 mg total) by mouth daily.   ezetimibe (ZETIA) 10 MG tablet Take 1 tablet (10 mg total) by mouth daily.   gabapentin (NEURONTIN) 100 MG capsule TAKE ONE TO THREE CAPSULES BY MOUTH EVERY NIGHT AT BEDTIME (Patient taking differently: Take 300 mg by mouth at bedtime.)   gabapentin (NEURONTIN) 300 MG capsule Take 1 capsule (300 mg total) by mouth 3 (three) times daily.   sildenafil (REVATIO) 20 MG tablet TAKE 3-5 TABLETS BY MOUTH DAILY AS NEEDED   venlafaxine XR (EFFEXOR-XR) 75 MG 24 hr capsule Take 3 capsules (225 mg total) by mouth daily with breakfast.    PHQ 2/9 Scores 09/15/2020 07/03/2020 06/09/2020 11/07/2019  PHQ - 2 Score 0 0 0 0  PHQ- 9 Score 1 0 0 0    GAD 7 : Generalized Anxiety Score 09/15/2020 07/03/2020 06/09/2020 11/07/2019  Nervous, Anxious, on Edge 0 0 0 0  Control/stop worrying 0 0 0 0  Worry too much - different things 0 0 0 0  Trouble relaxing 0 0 0 0  Restless 0 0 0 0  Easily annoyed or irritable 0 0 0 0  Afraid - awful might happen 0 0 0 0  Total GAD 7 Score 0 0 0 0  Anxiety Difficulty Not difficult at all - Not difficult at all Not difficult at all    BP Readings from Last 3 Encounters:  09/15/20 132/70  07/03/20 106/72  06/09/20 104/68    Physical Exam Vitals and nursing note reviewed.  Constitutional:      General: He is not in acute distress.    Appearance: Normal appearance. He is well-developed.  HENT:     Head: Normocephalic and atraumatic.  Neck:     Vascular: No  carotid bruit.  Cardiovascular:     Rate and Rhythm: Normal rate and regular rhythm.     Heart sounds: No murmur heard. Pulmonary:     Effort: Pulmonary effort is normal. No respiratory distress.     Breath sounds: No wheezing or rhonchi.  Musculoskeletal:     Cervical back: Normal range of motion.     Right lower leg: No edema.     Left lower leg: No edema.     Right foot: Deformity present.     Left foot: Deformity present.     Comments: Increasing foot pain related to deformity and presumed bunions  Lymphadenopathy:     Cervical: No cervical adenopathy.  Skin:  General: Skin is warm and dry.     Findings: Abscess present. No rash.     Comments: Scattered healing lesions both inner thighs, left gluteal clefts and lower left buttock.  Neurological:     Mental Status: He is alert and oriented to person, place, and time.  Psychiatric:        Mood and Affect: Mood normal.        Behavior: Behavior normal.    Wt Readings from Last 3 Encounters:  09/15/20 252 lb (114.3 kg)  07/03/20 255 lb (115.7 kg)  06/09/20 262 lb (118.8 kg)    BP 132/70 (BP Location: Right Arm, Patient Position: Sitting, Cuff Size: Large)   Pulse 76   Temp 99.2 F (37.3 C) (Oral)   Ht 5\' 11"  (1.803 m)   Wt 252 lb (114.3 kg)   SpO2 94%   BMI 35.15 kg/m   Assessment and Plan: 1. Type II diabetes mellitus with complication ([facility]) Clinically stable by exam and report without s/s of hypoglycemia. DM complicated by dyslipidemia. Continue diet and weight loss without medications for now. - Hemoglobin A1c  2. Hyperlipidemia associated with type 2 diabetes mellitus ([place]) Now on Zetia and tolerating it well. Will check labs - Lipid panel  3. Erectile dysfunction, unspecified erectile dysfunction type Instructed to use up to 5 - 20 mg tabs once a day as needed - sildenafil (REVATIO) 20 MG tablet; Take 1-5 tablets (20-100 mg total) by mouth daily.  Dispense: 30 tablet; Refill: 2  4. Cutaneous abscess,  unspecified site Recommend anti-bacterial bath soap Treat new lesions with Bactroban Will check CBC - no indication for oral antibiotics at this time - CBC with Differential/Platelet - mupirocin cream (BACTROBAN) 2 %; Apply 1 application topically 2 (two) times daily.  Dispense: 30 g; Refill: 2   Partially dictated using Editor, commissioning. Any errors are unintentional.  Halina Maidens, MD [facility]  09/15/2020

## 2020-09-17 LAB — CBC WITH DIFFERENTIAL/PLATELET
Basophils Absolute: 0.1 10*3/uL (ref 0.0–0.2)
Basos: 1 %
EOS (ABSOLUTE): 0.2 10*3/uL (ref 0.0–0.4)
Eos: 3 %
Hematocrit: 43.5 % (ref 37.5–51.0)
Hemoglobin: 14.4 g/dL (ref 13.0–17.7)
Immature Grans (Abs): 0 10*3/uL (ref 0.0–0.1)
Immature Granulocytes: 0 %
Lymphocytes Absolute: 1.7 10*3/uL (ref 0.7–3.1)
Lymphs: 34 %
MCH: 31.6 pg (ref 26.6–33.0)
MCHC: 33.1 g/dL (ref 31.5–35.7)
MCV: 95 fL (ref 79–97)
Monocytes Absolute: 0.3 10*3/uL (ref 0.1–0.9)
Monocytes: 7 %
Neutrophils Absolute: 2.6 10*3/uL (ref 1.4–7.0)
Neutrophils: 55 %
Platelets: 187 10*3/uL (ref 150–450)
RBC: 4.56 x10E6/uL (ref 4.14–5.80)
RDW: 13.5 % (ref 11.6–15.4)
WBC: 4.8 10*3/uL (ref 3.4–10.8)

## 2020-09-17 LAB — LIPID PANEL
Chol/HDL Ratio: 3.9 ratio (ref 0.0–5.0)
Cholesterol, Total: 222 mg/dL — ABNORMAL HIGH (ref 100–199)
HDL: 57 mg/dL (ref >39)
LDL Chol Calc (NIH): 129 mg/dL — ABNORMAL HIGH (ref 0–99)
Triglycerides: 203 mg/dL — ABNORMAL HIGH (ref 0–149)
VLDL Cholesterol Cal: 36 mg/dL (ref 5–40)

## 2020-09-17 LAB — HEMOGLOBIN A1C
Est. average glucose Bld gHb Est-mCnc: 123 mg/dL
Hgb A1c MFr Bld: 5.9 % — ABNORMAL HIGH (ref 4.8–5.6)

## 2020-09-23 ENCOUNTER — Ambulatory Visit: Payer: Medicare Other | Admitting: Internal Medicine

## 2020-09-27 ENCOUNTER — Other Ambulatory Visit: Payer: Self-pay | Admitting: Internal Medicine

## 2020-09-27 DIAGNOSIS — G2581 Restless legs syndrome: Secondary | ICD-10-CM

## 2020-10-07 ENCOUNTER — Ambulatory Visit: Payer: Medicare Other | Admitting: Internal Medicine

## 2020-11-14 ENCOUNTER — Other Ambulatory Visit: Payer: Self-pay

## 2020-11-14 ENCOUNTER — Ambulatory Visit (INDEPENDENT_AMBULATORY_CARE_PROVIDER_SITE_OTHER): Payer: Medicare Other | Admitting: Podiatry

## 2020-11-14 ENCOUNTER — Ambulatory Visit (INDEPENDENT_AMBULATORY_CARE_PROVIDER_SITE_OTHER): Payer: Medicare Other

## 2020-11-14 DIAGNOSIS — Q666 Other congenital valgus deformities of feet: Secondary | ICD-10-CM | POA: Diagnosis not present

## 2020-11-14 NOTE — Progress Notes (Signed)
   Subjective:  60 y.o. male presenting today as a new patient for evaluation of symptomatic bilateral flatfoot.  Patient states that he has had flatfeet for several years and has been treated in the past by a podiatrist who has tried orthotics.  The patient's main complaint today is that the orthotics do not seem to last.  The previous podiatrist had dispensed multiple orthotics however they only lasted about a month and he says that either broke or fell apart.  He says that they did provide some relief.  His brother also has flatfeet deformity and wears orthotics with significant relief.  He presents for further treatment and evaluation   No past medical history on file.     Objective/Physical Exam General: The patient is alert and oriented x3 in no acute distress.  Dermatology: Skin is warm, dry and supple bilateral lower extremities. Negative for open lesions or macerations.  Vascular: Palpable pedal pulses bilaterally. No edema or erythema noted. Capillary refill within normal limits.  Neurological: Epicritic and protective threshold grossly intact bilaterally.   Musculoskeletal Exam: Range of motion within normal limits to all pedal and ankle joints bilateral. Muscle strength 5/5 in all groups bilateral.  Upon weightbearing there is a medial longitudinal arch collapse bilaterally. Remove foot valgus noted to the bilateral lower extremities with excessive pronation upon mid stance.  Radiographic Exam:  Normal osseous mineralization. Joint spaces preserved. No fracture/dislocation/boney destruction.   Pes planus noted on radiographic exam lateral views. Decreased calcaneal inclination and metatarsal declination angle is noted. Anterior break in the cyma line noted on lateral views. Medial talar head to deviation noted on AP radiograph.   Assessment: 1. pes planus bilateral   Plan of Care:  1. Patient was evaluated. X-Rays reviewed.  2.  Today were going to set up an appointment  with our Pedorthist for new moldings for orthotics. 3.  The patient does have somewhat severe pes planovalgus deformity.  Given the patient's age and the progressive arthritic changes within the joint he may be a candidate for possible triple arthrodesis if orthotics fail to provide any significant relief.  The surgery was lightly discussed with the patient today. 4.  Return to clinic in 6 months  *Patient on permanent disability due to the flatfeet   Edrick Kins, DPM [facility]  Dr. Edrick Kins, DPM    [address], [place] [address]                Office [phone]  Fax [phone]

## 2020-12-03 ENCOUNTER — Other Ambulatory Visit: Payer: Self-pay | Admitting: Internal Medicine

## 2020-12-03 DIAGNOSIS — F3341 Major depressive disorder, recurrent, in partial remission: Secondary | ICD-10-CM

## 2020-12-30 ENCOUNTER — Other Ambulatory Visit: Payer: Self-pay | Admitting: Internal Medicine

## 2020-12-30 DIAGNOSIS — N529 Male erectile dysfunction, unspecified: Secondary | ICD-10-CM

## 2020-12-31 ENCOUNTER — Encounter: Payer: Self-pay | Admitting: Urology

## 2020-12-31 ENCOUNTER — Ambulatory Visit (INDEPENDENT_AMBULATORY_CARE_PROVIDER_SITE_OTHER): Payer: Medicare Other | Admitting: Internal Medicine

## 2020-12-31 ENCOUNTER — Encounter: Payer: Self-pay | Admitting: Internal Medicine

## 2020-12-31 ENCOUNTER — Other Ambulatory Visit (INDEPENDENT_AMBULATORY_CARE_PROVIDER_SITE_OTHER): Payer: Medicare Other

## 2020-12-31 ENCOUNTER — Other Ambulatory Visit: Payer: Self-pay

## 2020-12-31 ENCOUNTER — Ambulatory Visit (INDEPENDENT_AMBULATORY_CARE_PROVIDER_SITE_OTHER): Payer: Medicare Other | Admitting: Urology

## 2020-12-31 ENCOUNTER — Ambulatory Visit: Payer: Medicare Other | Admitting: Internal Medicine

## 2020-12-31 VITALS — BP 146/93 | HR 97 | Ht 71.0 in | Wt 238.0 lb

## 2020-12-31 VITALS — BP 136/86 | HR 67 | Ht 71.0 in | Wt 238.0 lb

## 2020-12-31 DIAGNOSIS — E1169 Type 2 diabetes mellitus with other specified complication: Secondary | ICD-10-CM

## 2020-12-31 DIAGNOSIS — K4090 Unilateral inguinal hernia, without obstruction or gangrene, not specified as recurrent: Secondary | ICD-10-CM | POA: Diagnosis not present

## 2020-12-31 DIAGNOSIS — E118 Type 2 diabetes mellitus with unspecified complications: Secondary | ICD-10-CM | POA: Diagnosis not present

## 2020-12-31 DIAGNOSIS — N50811 Right testicular pain: Secondary | ICD-10-CM

## 2020-12-31 DIAGNOSIS — Q666 Other congenital valgus deformities of feet: Secondary | ICD-10-CM

## 2020-12-31 DIAGNOSIS — F3341 Major depressive disorder, recurrent, in partial remission: Secondary | ICD-10-CM

## 2020-12-31 DIAGNOSIS — N50819 Testicular pain, unspecified: Secondary | ICD-10-CM

## 2020-12-31 DIAGNOSIS — E785 Hyperlipidemia, unspecified: Secondary | ICD-10-CM

## 2020-12-31 MED ORDER — EZETIMIBE 10 MG PO TABS: 10.0000 mg | ORAL_TABLET | Freq: Every day | ORAL | 1 refills | Status: DC

## 2020-12-31 NOTE — Progress Notes (Signed)
01/01/21 8:50 AM   Anthony Cohen [birth year] [id number]  Referring provider:  Glean Hess, MD [address] [place],  [place] [address] Chief Complaint  Patient presents with   Testicle Pain     HPI: Anthony Cohen is a 60 y.o.male who presents today for further evaluation of right testicular pain and retraction.   He was seen by PCP today for testicular pain and referred to urology.   He reports that over the past week or so, his right testicle and inguinal area have become sore.  His right testicle seems to be drawing up into his body and quite high riding.  He denies any scrotal or testicular trauma.  He denies any increased swelling or redness.  He denies any urinary symptoms, modifying or aggravating factors, gross hematuria, dysuria or suprapubic/flank pain.  Patient denies any fevers, chills. He reports he has never experienced this before.   He has had a bought of nausea which comes and goes over the past several months.  No associated abdominal or testicular pain.     PMH: No past medical history on file.  Surgical History: Past Surgical History:  Procedure Laterality Date   NO PAST SURGERIES      Home Medications:  Allergies as of 12/31/2020   No Active Allergies      Medication List        Accurate as of December 31, 2020 11:59 PM. If you have any questions, ask your nurse or doctor.          STOP taking these medications    mupirocin cream 2 % Commonly known as: Bactroban Stopped by: Halina Maidens, MD       TAKE these medications    buPROPion 150 MG 24 hr tablet Commonly known as: WELLBUTRIN XL Take 3 tablets (450 mg total) by mouth daily.   ezetimibe 10 MG tablet Commonly known as: Zetia Take 1 tablet (10 mg total) by mouth daily.   gabapentin 300 MG capsule Commonly known as: NEURONTIN Take 1 capsule (300 mg total) by mouth 3 (three) times daily. What changed: Another medication with the same name was removed.  Continue taking this medication, and follow the directions you see here. Changed by: Halina Maidens, MD   sildenafil 20 MG tablet Commonly known as: REVATIO TAKE 1-5 TABLETS BY MOUTH DAILY   venlafaxine XR 75 MG 24 hr capsule Commonly known as: EFFEXOR-XR TAKE THREE CAPSULES BY MOUTH DAILY WITH BREAFAST        Allergies: No Active Allergies  Family History: Family History  Problem Relation Age of Onset   Hypertension Mother    Breast cancer Mother     Social History:  reports that he quit smoking about 5 years ago. His smoking use included cigarettes. He started smoking about 5 years ago. He has a 3.00 pack-year smoking history. He has never used smokeless tobacco. He reports current alcohol use. He reports that he does not use drugs.   Physical Exam: BP (!) 146/93   Pulse 97   Ht 5\' 11"  (1.803 m)   Wt 238 lb (108 kg)   BMI 33.19 kg/m   Constitutional:  Alert and oriented, No acute distress. HEENT: [place] AT, moist mucus membranes.  Trachea midline, no masses. Cardiovascular: No clubbing, cyanosis, or edema. Respiratory: Normal respiratory effort, no increased work of breathing. GI: Abdomen is soft, nontender, nondistended, no abdominal masses GU: Scrotum without any edema or erythema, immediately noticeable is a high riding right  testicle compared to the left.  Left testicle is normal without masses.  Right testicle is also normal, retracted into the upper part of the scrotum but is easily brought down.  The testicle itself is normal without tenderness.  Careful right inguinal exam does reveal focal tenderness at the most proximal aspect of the cord/inguinal canal with some bulging this at this location.  He has significant focal point tenderness at this location. Lymph: No inguinal lymphadenopathy. Skin: No rashes, bruises or suspicious lesions. Neurologic: Grossly intact, no focal deficits, moving all 4 extremities. Psychiatric: Normal mood and affect.  Laboratory  Data:  Lab Results  Component Value Date   CREATININE 0.90 06/09/2020    Lab Results  Component Value Date   HGBA1C 5.9 (H) 09/15/2020    Assessment & Plan:    Right inguinal pain/ right retractile testicle - Exam revealed right inguinal tenderness and bulging with high riding but easily reducible testicle back into normal position; testicle itself is normal and nontender - Suspicious for possible hernia; differential diagnosis also includes groin pull  -Warning symptoms reviewed for incarceration - CT of pelvis with oral contrast   Follow-up with CT results   Conley Rolls as a scribe for Hollice Espy, MD.,have documented all relevant documentation on the behalf of Hollice Espy, MD,as directed by  Hollice Espy, MD while in the presence of Hollice Espy, MD.  I have reviewed the above documentation for accuracy and completeness, and I agree with the above.   Hollice Espy, MD   [facility] [address], [place], [place] [address] [phone]

## 2020-12-31 NOTE — Telephone Encounter (Signed)
Requested Prescriptions  Pending Prescriptions Disp Refills  . sildenafil (REVATIO) 20 MG tablet [Pharmacy Med Name: SILDENAFIL 20 MG TABLET] 30 tablet 2    Sig: TAKE 1-5 TABLETS BY MOUTH DAILY     Urology: Erectile Dysfunction Agents Passed - 12/30/2020  9:11 PM      Passed - Last BP in normal range    BP Readings from Last 1 Encounters:  09/15/20 132/70         Passed - Valid encounter within last 12 months    Recent Outpatient Visits          3 months ago Type II diabetes mellitus with complication [facility])   [facility] Glean Hess, MD   6 months ago Restless leg syndrome   [facility] Glean Hess, MD   6 months ago Type II diabetes mellitus with complication [facility])   [facility] Glean Hess, MD   1 year ago Cervical radiculopathy   [facility] Glean Hess, MD   1 year ago Nerve compression syndrome   [facility] Glean Hess, MD      Future Appointments            Today Glean Hess, MD [facility] [place]

## 2020-12-31 NOTE — Progress Notes (Signed)
Date:  12/31/2020   Name:  Anthony Cohen   DOB:  [birth year]   MRN:  [id number]   Chief Complaint: Diabetes  Diabetes He presents for his follow-up diabetic visit. He has type 2 diabetes mellitus. His disease course has been stable. Pertinent negatives for hypoglycemia include no headaches or tremors. Pertinent negatives for diabetes include no chest pain, no fatigue, no polydipsia and no polyuria. Risk factors for coronary artery disease include male sex, obesity and dyslipidemia. Current diabetic treatment includes diet. He is compliant with treatment all of the time.  Testicle Pain The patient's primary symptoms include testicular pain. The patient's pertinent negatives include no genital lesions, pelvic pain, penile discharge or penile pain. This is a new problem. The current episode started in the past 7 days. The problem occurs constantly. The problem has been gradually worsening. Pertinent negatives include no abdominal pain, chest pain, coughing, dysuria, headaches, rash or shortness of breath. The testicular pain affects the right testicle. The color of the testicles is Normal. Nothing aggravates the symptoms. He has tried nothing for the symptoms.   Lab Results  Component Value Date   CREATININE 0.90 06/09/2020   BUN 20 06/09/2020   NA 140 06/09/2020   K 5.1 06/09/2020   CL 102 06/09/2020   CO2 25 06/09/2020   Lab Results  Component Value Date   CHOL 222 (H) 09/15/2020   HDL 57 09/15/2020   LDLCALC 129 (H) 09/15/2020   TRIG 203 (H) 09/15/2020   CHOLHDL 3.9 09/15/2020   Lab Results  Component Value Date   TSH 2.240 03/10/2015   Lab Results  Component Value Date   HGBA1C 5.9 (H) 09/15/2020   Lab Results  Component Value Date   WBC 4.8 09/15/2020   HGB 14.4 09/15/2020   HCT 43.5 09/15/2020   MCV 95 09/15/2020   PLT 187 09/15/2020   Lab Results  Component Value Date   ALT 38 06/09/2020   AST 32 06/09/2020   ALKPHOS 85 06/09/2020   BILITOT 0.5 06/09/2020      Review of Systems  Constitutional:  Negative for appetite change, fatigue and unexpected weight change.  Eyes:  Negative for visual disturbance.  Respiratory:  Negative for cough, shortness of breath and wheezing.   Cardiovascular:  Negative for chest pain, palpitations and leg swelling.  Gastrointestinal:  Negative for abdominal pain and blood in stool.  Endocrine: Negative for polydipsia and polyuria.  Genitourinary:  Positive for testicular pain. Negative for dysuria, hematuria, pelvic pain, penile discharge and penile pain.  Skin:  Negative for color change and rash.  Neurological:  Negative for tremors, numbness and headaches.  Psychiatric/Behavioral:  Negative for dysphoric mood.    Patient Active Problem List   Diagnosis Date Noted   Erectile dysfunction 06/09/2020   Pinched nerve in shoulder, left 09/11/2019   Pes planus, congenital 05/26/2018   Restless leg syndrome 05/26/2018   Arthritis of knee 10/25/2016   GERD without esophagitis 04/20/2016   Type II diabetes mellitus with complication ([place]) 29/52/8413   Hyperlipidemia associated with type 2 diabetes mellitus ([place]) 06/16/2015   OSA (obstructive sleep apnea) 03/10/2015   Recurrent major depressive disorder, in partial remission ([place]) 02/19/2015   Family history of digestive disorder 02/19/2015   Tobacco use disorder, mild, in sustained remission 02/19/2015    Allergies  Allergen Reactions   Atorvastatin Other (See Comments)    Transient elevation in LFTs ~2007   Crestor [Rosuvastatin] Other (See Comments)    Elevated liver  tests    Past Surgical History:  Procedure Laterality Date   NO PAST SURGERIES      Social History   Tobacco Use   Smoking status: Former    Packs/day: 1.00    Years: 3.00    Pack years: 3.00    Types: Cigarettes    Start date: 03/28/2015    Quit date: 03/29/2015    Years since quitting: 5.7   Smokeless tobacco: Never  Vaping Use   Vaping Use: Never used  Substance Use Topics    Alcohol use: Yes    Alcohol/week: 0.0 standard drinks   Drug use: No     Medication list has been reviewed and updated.  Current Meds  Medication Sig   buPROPion (WELLBUTRIN XL) 150 MG 24 hr tablet Take 3 tablets (450 mg total) by mouth daily.   ezetimibe (ZETIA) 10 MG tablet Take 1 tablet (10 mg total) by mouth daily.   gabapentin (NEURONTIN) 300 MG capsule Take 1 capsule (300 mg total) by mouth 3 (three) times daily.   sildenafil (REVATIO) 20 MG tablet TAKE 1-5 TABLETS BY MOUTH DAILY   venlafaxine XR (EFFEXOR-XR) 75 MG 24 hr capsule TAKE THREE CAPSULES BY MOUTH DAILY WITH BREAFAST    PHQ 2/9 Scores 12/31/2020 09/15/2020 07/03/2020 06/09/2020  PHQ - 2 Score 0 0 0 0  PHQ- 9 Score 1 1 0 0    GAD 7 : Generalized Anxiety Score 12/31/2020 09/15/2020 07/03/2020 06/09/2020  Nervous, Anxious, on Edge 1 0 0 0  Control/stop worrying 0 0 0 0  Worry too much - different things 0 0 0 0  Trouble relaxing 0 0 0 0  Restless 0 0 0 0  Easily annoyed or irritable 1 0 0 0  Afraid - awful might happen 0 0 0 0  Total GAD 7 Score 2 0 0 0  Anxiety Difficulty Not difficult at all Not difficult at all - Not difficult at all    BP Readings from Last 3 Encounters:  12/31/20 136/86  09/15/20 132/70  07/03/20 106/72    Physical Exam Vitals and nursing note reviewed.  Constitutional:      General: He is in acute distress.     Appearance: He is well-developed.  HENT:     Head: Normocephalic and atraumatic.  Cardiovascular:     Rate and Rhythm: Normal rate and regular rhythm.  Pulmonary:     Effort: Pulmonary effort is normal. No respiratory distress.  Abdominal:     Hernia: A hernia is present. Hernia is present in the right inguinal area (possible hernia).  Genitourinary:    Penis: Circumcised.      Testes:        Right: Tenderness present.        Left: Mass, tenderness or swelling not present.     Comments: Right testicle retracted Skin:    General: Skin is warm and dry.     Findings: No  rash.  Neurological:     Mental Status: He is alert and oriented to person, place, and time.  Psychiatric:        Mood and Affect: Mood normal.        Behavior: Behavior normal.    Wt Readings from Last 3 Encounters:  12/31/20 238 lb (108 kg)  09/15/20 252 lb (114.3 kg)  07/03/20 255 lb (115.7 kg)    BP 136/86   Pulse 67   Ht 5\' 11"  (1.803 m)   Wt 238 lb (108 kg)   SpO2  98%   BMI 33.19 kg/m   Assessment and Plan: 1. Type II diabetes mellitus with complication (HCC) Doing well with diet and weight loss Continue current regimen Labs next visit  2. Testicular pain, right BUA will see him today as an urgent referral - Ambulatory referral to Urology  3. Recurrent major depressive disorder, in partial remission (HCC) Clinically stable on current regimen with good control of symptoms, No SI or HI. Will continue current therapy.  4. Hyperlipidemia associated with type 2 diabetes mellitus ([place]) Cholesterol improved on Zetia without side effects - ezetimibe (ZETIA) 10 MG tablet; Take 1 tablet (10 mg total) by mouth daily.  Dispense: 90 tablet; Refill: 1   Partially dictated using Editor, commissioning. Any errors are unintentional.  Halina Maidens, MD [facility]  12/31/2020

## 2021-01-06 ENCOUNTER — Ambulatory Visit: Payer: Medicare Other | Admitting: Internal Medicine

## 2021-01-07 ENCOUNTER — Other Ambulatory Visit: Payer: Self-pay

## 2021-01-07 ENCOUNTER — Ambulatory Visit
Admission: RE | Admit: 2021-01-07 | Discharge: 2021-01-07 | Disposition: A | Discharge status: Home / Self Care | Payer: Medicare Other | Source: Ambulatory Visit | Attending: Urology | Admitting: Urology

## 2021-01-07 DIAGNOSIS — N50811 Right testicular pain: Secondary | ICD-10-CM | POA: Insufficient documentation

## 2021-01-07 DIAGNOSIS — K4090 Unilateral inguinal hernia, without obstruction or gangrene, not specified as recurrent: Secondary | ICD-10-CM | POA: Insufficient documentation

## 2021-01-07 LAB — MICROSCOPIC EXAMINATION
Bacteria, UA: NONE SEEN
Epithelial Cells (non renal): NONE SEEN /hpf (ref 0–10)

## 2021-01-07 LAB — URINALYSIS, COMPLETE
Bilirubin, UA: NEGATIVE
Glucose, UA: NEGATIVE
Ketones, UA: NEGATIVE
Nitrite, UA: NEGATIVE
Protein,UA: NEGATIVE
RBC, UA: NEGATIVE
Specific Gravity, UA: 1.025 (ref 1.005–1.030)
Urobilinogen, Ur: 0.2 mg/dL (ref 0.2–1.0)
pH, UA: 5.5 (ref 5.0–7.5)

## 2021-01-08 ENCOUNTER — Telehealth: Payer: Self-pay | Admitting: *Deleted

## 2021-01-08 NOTE — Telephone Encounter (Addendum)
Patient informed. His pain is about the same-it has not worsened and it about the same. He states he thinks it is more in his groin area, pain does radiate some down his right leg. Denies any other symptoms.  ----- Message from Hollice Espy, MD sent at 01/08/2021  2:59 PM EDT ----- Please let this patient know that I reviewed his CT scan personally.  There is no evidence of inguinal hernia or any obvious scrotal pathology.  Please get some feedback about how he is doing and how he is feeling?  Hollice Espy, MD

## 2021-01-13 ENCOUNTER — Ambulatory Visit: Payer: Self-pay

## 2021-01-13 DIAGNOSIS — N50811 Right testicular pain: Secondary | ICD-10-CM

## 2021-01-13 NOTE — Telephone Encounter (Signed)
Pt. Reports he was referred to urology by Dr. Army Melia "and they did a CT Scan and said nothing was wrong." Pt. Reports his pain is worse. Constant and worse with walking. Has not called urology back. " They just seemed like they were done with me and I don't really know about them." Pt. Wants to know what Dr. Army Melia thinks. Please advise pt.    Answer Assessment - Initial Assessment Questions 1. LOCATION and RADIATION: "Where is the pain located?"      Right side 2. QUALITY: "What does the pain feel like?"  (e.g., sharp, dull, aching, burning)     Stabbing 3. SEVERITY: "How bad is the pain?"  (Scale 1-10; or mild, moderate, severe)   - MILD (1-3): doesn't interfere with normal activities    - MODERATE (4-7): interferes with normal activities (e.g., work or school) or awakens from sleep   - SEVERE (8-10): excruciating pain, unable to do any normal activities, difficulty walking     7 4. ONSET: "When did the pain start?"     2 weeks ago 5. PATTERN: "Does it come and go, or has it been constant since it started?"     Constant 6. SCROTAL APPEARANCE: "What does the scrotum look like?" "Is there any swelling or redness?"      No 7. HERNIA: "Has a doctor ever told you that you have a hernia?"     No 8. OTHER SYMPTOMS: "Do you have any other symptoms?" (e.g., fever, abdominal pain, vomiting, difficulty passing urine)     No  Protocols used: Scrotal Pain-A-AH

## 2021-01-13 NOTE — Telephone Encounter (Signed)
Called patient to inform we ordered a scrotal ultrasound and scheduled follow up. Denies any worsening symptoms since last visit. Aware to call with any worsening symptoms.      Please schedule a scrotal ultrasound and have him f/u with me.     Hollice Espy, MD

## 2021-01-14 ENCOUNTER — Other Ambulatory Visit: Payer: Self-pay

## 2021-01-14 ENCOUNTER — Ambulatory Visit
Admission: RE | Admit: 2021-01-14 | Discharge: 2021-01-14 | Disposition: A | Discharge status: Home / Self Care | Payer: Medicare Other | Source: Ambulatory Visit | Attending: Urology | Admitting: Urology

## 2021-01-14 DIAGNOSIS — N50811 Right testicular pain: Secondary | ICD-10-CM | POA: Insufficient documentation

## 2021-01-14 DIAGNOSIS — K4090 Unilateral inguinal hernia, without obstruction or gangrene, not specified as recurrent: Secondary | ICD-10-CM

## 2021-01-14 NOTE — Telephone Encounter (Signed)
Patient called today stating pain is worsening, states he is unable to walk pain is 9/10. He states that his left testicle has retracted back inside him. He has been utilizing OTC Ibuprofen 250mg  every 12 hours. He was encouraged to increase dose to 800mg  every 6-8 hours to help with pain and use ICE PRN. Patient verbalized understanding. Patient requested Scrotal u/s to be moved up if possible. Per Dr. Erlene Quan ok to change u/s status to STAT. Order was updated. Patient notified scheduling will contact today for a STAT work in appt

## 2021-01-14 NOTE — Addendum Note (Signed)
Addended by: Tommy Rainwater on: 01/14/2021 11:59 AM   Modules accepted: Orders

## 2021-01-15 ENCOUNTER — Telehealth: Payer: Self-pay | Admitting: *Deleted

## 2021-01-15 NOTE — Telephone Encounter (Addendum)
Patient informed, voiced understanding.  Scheduled follow up, denies any pain or changes. States his pain has improved greatly, thinks it is more muscular or possibly a groin pull. Will call with any worsening symptoms.    ----- Message from Hollice Espy, MD sent at 01/14/2021  5:30 PM EDT ----- Scrotal ultrasound was absolutely normal.  There is no evidence of infection, blood flow issues, or hernia on this study.    I would recommend seeing you again in the office for a repeat exam to make sure were not missing something.  Becca- please schedule.  Hollice Espy, MD

## 2021-01-16 ENCOUNTER — Ambulatory Visit: Payer: Medicare Other | Admitting: Internal Medicine

## 2021-01-20 NOTE — Progress Notes (Signed)
01/21/21 3:31 PM   Anthony Cohen 1960-11-03 [id number]  Referring provider:  Glean Hess, MD [address] [place],  [place] [address] Chief Complaint  Patient presents with   Follow-up     HPI: Anthony Cohen is a 60 y.o.male with a personal history of right inguinal pain, right retractile testicle, who presents today for follow-up with scrotal ultrasound.   He underwent a pelvic CT which evidence of inguinal hernia.  He continued have pain and f/u scrotal ultrasound ordered.    Scrotal ultrasound on 01/14/2021 revealed negative for torsion or intratesticular mass and negative for right inguinal hernia.    He reports today that his symptoms are worsening.  They had been improving and now  He is now experiencing a stabbing pain that radiate to his hamstring that started about 3 days ago. He was unable to tolerate scrotal support and feels like his testicle is high riding again.   Upon further review of scrotal ultrasound / CT scan sought second opinions from Dr. Bernardo Heater and Dr. Dahlia Byes. Dr. Dahlia Byes offered to do an inguinal nerve block at his office which who I personally communicated with today.    PMH: No past medical history on file.  Surgical History: Past Surgical History:  Procedure Laterality Date   NO PAST SURGERIES      Home Medications:  Allergies as of 01/21/2021   No Active Allergies      Medication List        Accurate as of January 21, 2021  3:31 PM. If you have any questions, ask your nurse or doctor.          buPROPion 150 MG 24 hr tablet Commonly known as: WELLBUTRIN XL Take 3 tablets (450 mg total) by mouth daily.   ezetimibe 10 MG tablet Commonly known as: Zetia Take 1 tablet (10 mg total) by mouth daily.   gabapentin 300 MG capsule Commonly known as: NEURONTIN Take 1 capsule (300 mg total) by mouth 3 (three) times daily.   sildenafil 20 MG tablet Commonly known as: REVATIO TAKE 1-5 TABLETS BY MOUTH DAILY    venlafaxine XR 75 MG 24 hr capsule Commonly known as: EFFEXOR-XR TAKE THREE CAPSULES BY MOUTH DAILY WITH BREAFAST        Allergies: No Active Allergies  Family History: Family History  Problem Relation Age of Onset   Hypertension Mother    Breast cancer Mother     Social History:  reports that he quit smoking about 5 years ago. His smoking use included cigarettes. He started smoking about 5 years ago. He has a 3.00 pack-year smoking history. He has never used smokeless tobacco. He reports current alcohol use. He reports that he does not use drugs.   Physical Exam: BP (!) 144/88   Pulse 93   Ht 5\' 11"  (1.803 m)   Wt 238 lb (108 kg)   BMI 33.19 kg/m   Constitutional:  Alert and oriented, No acute distress. HEENT:  AT, moist mucus membranes.  Trachea midline, no masses. Cardiovascular: No clubbing, cyanosis, or edema. Respiratory: Normal respiratory effort, no increased work of breathing. GU: Right testicle retracted in the high scrotum but easily retractable into the downward scrotum.  Fullness in proximal cord but no obvious inguinal hernia.   Skin: No rashes, bruises or suspicious lesions. Neurologic: Grossly intact, no focal deficits, moving all 4 extremities. Psychiatric: Normal mood and affect.   Pertinent Imaging: CLINICAL DATA:  Testicle pain   EXAM: SCROTAL ULTRASOUND  DOPPLER ULTRASOUND OF THE TESTICLES   TECHNIQUE: Complete ultrasound examination of the testicles, epididymis, and other scrotal structures was performed. Color and spectral Doppler ultrasound were also utilized to evaluate blood flow to the testicles.   COMPARISON:  CT 01/07/2021   FINDINGS: Right testicle   Measurements: 5.3 x 2.7 x 3.6 cm. No mass or microlithiasis visualized.   Left testicle   Measurements: 5.3 x 2.1 x 3.4 cm. No mass or microlithiasis visualized.   Right epididymis:  Normal in size and appearance.   Left epididymis:  Normal in size and appearance.    Hydrocele:  None visualized.   Varicocele:  None visualized.   Pulsed Doppler interrogation of both testes demonstrates normal low resistance arterial and venous waveforms bilaterally.   Targeted ultrasound of the right inguinal region with and without Valsalva demonstrates no inguinal hernia   IMPRESSION: Negative for torsion or intratesticular mass. Negative for right inguinal hernia     Electronically Signed   By: Donavan Foil M.D.   On: 01/14/2021 17:14  Scrotal ultrasound and pelvic CT personally reviewed again today, agree with radiologic interpretation.    Assessment & Plan:   Scrotal / inguinal pain  - Worsening  - Reviewed scrotal ultrasound/ CT  with Dr. Dahlia Byes who offered and inguinal nerve block to further evaluate. Patient is agreeable with this plan and is seeing Dr. Dahlia Byes today.  - Referral sent to Dr. Dahlia Byes will see and treat patient TODAY   I,Kailey Littlejohn,acting as a scribe for Hollice Espy, MD.,have documented all relevant documentation on the behalf of Hollice Espy, MD,as directed by  Hollice Espy, MD while in the presence of Hollice Espy, MD.  I have reviewed the above documentation for accuracy and completeness, and I agree with the above.   Hollice Espy, MD   [facility] [address], [place], [place] [address] [phone]  I spent 35 total minutes on the day of the encounter including pre-visit review of the medical record, face-to-face time with the patient, and post visit ordering of labs/imaging/tests.

## 2021-01-21 ENCOUNTER — Ambulatory Visit (INDEPENDENT_AMBULATORY_CARE_PROVIDER_SITE_OTHER): Payer: Medicare Other | Admitting: Urology

## 2021-01-21 ENCOUNTER — Other Ambulatory Visit: Payer: Self-pay | Admitting: Urology

## 2021-01-21 ENCOUNTER — Encounter: Payer: Self-pay | Admitting: Urology

## 2021-01-21 ENCOUNTER — Ambulatory Visit (INDEPENDENT_AMBULATORY_CARE_PROVIDER_SITE_OTHER): Payer: Medicare Other | Admitting: Surgery

## 2021-01-21 ENCOUNTER — Other Ambulatory Visit: Payer: Self-pay

## 2021-01-21 VITALS — BP 144/88 | HR 93 | Ht 71.0 in | Wt 238.0 lb

## 2021-01-21 VITALS — BP 113/76 | HR 87 | Temp 98.9°F | Ht 72.0 in | Wt 238.0 lb

## 2021-01-21 DIAGNOSIS — R1031 Right lower quadrant pain: Secondary | ICD-10-CM | POA: Diagnosis not present

## 2021-01-21 DIAGNOSIS — N50811 Right testicular pain: Secondary | ICD-10-CM | POA: Diagnosis not present

## 2021-01-21 DIAGNOSIS — D176 Benign lipomatous neoplasm of spermatic cord: Secondary | ICD-10-CM

## 2021-01-21 MED ORDER — TRIAMCINOLONE ACETONIDE 40 MG/ML IJ SUSP
40.0000 mg | Freq: Once | INTRAMUSCULAR | Status: AC
  Administered 2021-01-21: 40 mg via INTRAMUSCULAR

## 2021-01-21 MED ORDER — GABAPENTIN 400 MG PO CAPS: 400.0000 mg | ORAL_CAPSULE | Freq: Three times a day (TID) | ORAL | 0 refills | Status: DC

## 2021-01-21 NOTE — Patient Instructions (Addendum)
A referral has been placed to Physical Therapy. They will call you with an appointment.   Please pick up your prescription at your pharmacy. You may take Ibuprofen 800 mg three times a day for a week.   If you have any concerns or questions, please feel free to call our office.   Selective Nerve Root Block, Care After  This sheet gives you information about how to care for yourself after your procedure. Your health care provider may also give you more specific instructions. If you have problems or questions, contact your health care provider. What can I expect after the procedure? After the procedure, it is common to feel some discomfort at the injection (puncture) site. The injection may temporarily reduce or eliminate pain in the part of the body that is supplied by the nerve. Follow these instructions at home: Activity Do not drive or use heavy machinery while taking prescription pain medicine. Do not drive for 24 hours if you were given a medicine to help you relax (sedative) during the procedure. Do not lift anything that is heavier than 10 lb (4.5 kg), or the limit that you are told, until your health care provider says that it is safe. Return to your normal activities as directed. Ask your health care provider what activities are safe for you. Puncture site care Remove your bandage (dressing) 24 hours after your procedure, or as told by your health care provider. Check your puncture site every day for signs of infection. Check for: Redness, swelling, or pain. Warmth. Fluid or blood. Pus or a bad smell. General instructions  Take over-the-counter and prescription medicines only as told by your health care provider. Do not take showers or baths, swim, or use a hot tub until your health care provider approves. In most cases, you may shower and bathe starting 24 hours after your procedure. If directed, put ice on the puncture area to relieve discomfort. Put ice in a plastic  bag. Place a towel between your skin and the bag. Leave the ice on for 20 minutes, 2-3 times a day. Keep all follow-up visits as told by your health care provider. This is important. Contact a health care provider if: You have redness, swelling, or pain around your puncture site. You have fluid or blood coming from your puncture site. You have pus or a bad smell coming from your puncture site. Your puncture site feels warm to the touch. You have a fever. Get help right away if: You have: Shortness of breath. A severe headache. Pain that gets worse. You develop weakness or loss of feeling (numbness). You develop muscle weakness or paralysis. Summary Do not drive for 24 hours if you were given a sedative. Check your puncture site every day for signs of infection. Return to your normal activities as directed. Ask your health care provider what activities are safe for you. Take over-the-counter and prescription medicines only as told by your health care provider. This information is not intended to replace advice given to you by your health care provider. Make sure you discuss any questions you have with your health care provider. Document Revised: 06/19/2020 Document Reviewed: 08/22/2019 Elsevier Patient Education  2022 [place].   Inguinal Hernia, Adult An inguinal hernia is when fat or your intestines push through a weak spot in a muscle where your leg meets your lower belly (groin). This causes a bulge. This kind of hernia could also be: In your scrotum, if you are male. In folds of skin  around your vagina, if you are male. There are three types of inguinal hernias: Hernias that can be pushed back into the belly (are reducible). This type rarely causes pain. Hernias that cannot be pushed back into the belly (are incarcerated). Hernias that cannot be pushed back into the belly and lose their blood supply (are strangulated). This type needs emergency surgery. What are the  causes? This condition is caused by having a weak spot in the muscles or tissues in your groin. This develops over time. The hernia may poke through the weak spot when you strain your lower belly muscles all of a sudden, such as when you: Lift a heavy object. Strain to poop (have a bowel movement). Trouble pooping (constipation) can lead to straining. Cough. What increases the risk? This condition is more likely to develop in: Males. Pregnant females. People who: Are overweight. Work in jobs that require long periods of standing or heavy lifting. Have had an inguinal hernia before. Smoke or have lung disease. These factors can lead to long-term (chronic) coughing. What are the signs or symptoms? Symptoms may depend on the size of the hernia. Often, a small hernia has no symptoms. Symptoms of a larger hernia may include: A bulge in the groin area. This is easier to see when standing. You might not be able to see it when you are lying down. Pain or burning in the groin. This may get worse when you lift, strain, or cough. A dull ache or a feeling of pressure in the groin. An abnormal bulge in the scrotum, in males. Symptoms of a strangulated inguinal hernia may include: A bulge in your groin that is very painful and tender to the touch. A bulge that turns red or purple. Fever, feeling like you may vomit (nausea), and vomiting. Not being able to poop or to pass gas. How is this treated? Treatment depends on the size of your hernia and whether you have symptoms. If you do not have symptoms, your doctor may have you watch your hernia carefully and have you come in for follow-up visits. If your hernia is large or if you have symptoms, you may need surgery to repair the hernia. Follow these instructions at home: Lifestyle Avoid lifting heavy objects. Avoid standing for long amounts of time. Do not smoke or use any products that contain nicotine or tobacco. If you need help quitting, ask your  doctor. Stay at a healthy weight. Prevent trouble pooping You may need to take these actions to prevent or treat trouble pooping: Drink enough fluid to keep your pee (urine) pale yellow. Take over-the-counter or prescription medicines. Eat foods that are high in fiber. These include beans, whole grains, and fresh fruits and vegetables. Limit foods that are high in fat and sugar. These include fried or sweet foods. General instructions You may try to push your hernia back in place by very gently pressing on it when you are lying down. Do not try to push the bulge back in if it will not go in easily. Watch your hernia for any changes in shape, size, or color. Tell your doctor if you see any changes. Take over-the-counter and prescription medicines only as told by your doctor. Keep all follow-up visits. Contact a doctor if: You have a fever or chills. You have new symptoms. Your symptoms get worse. Get help right away if: You have pain in your groin that gets worse all of a sudden. You have a bulge in your groin that: Gets bigger  all of a sudden, and it does not get smaller after that. Turns red or purple. Is painful when you touch it. You are a male, and you have: Sudden pain in your scrotum. A sudden change in the size of your scrotum. You cannot push the hernia back in place by very gently pressing on it when you are lying down. You feel like you may vomit, and that feeling does not go away. You keep vomiting. You have a fast heartbeat. You cannot poop or pass gas. These symptoms may be an emergency. Get help right away. Call your local emergency services (911 in the [place] Do not wait to see if the symptoms will go away. Do not drive yourself to the hospital. Summary An inguinal hernia is when fat or your intestines push through a weak spot in a muscle where your leg meets your lower belly (groin). This causes a bulge. If you do not have symptoms, you may not need treatment. If  you have symptoms or a large hernia, you may need surgery. Avoid lifting heavy objects. Also, avoid standing for long amounts of time. Do not try to push the bulge back in if it will not go in easily. This information is not intended to replace advice given to you by your health care provider. Make sure you discuss any questions you have with your health care provider. Document Revised: 11/06/2019 Document Reviewed: 11/06/2019 Elsevier Patient Education  2022 [employer].

## 2021-01-21 NOTE — Progress Notes (Signed)
error 

## 2021-01-21 NOTE — Patient Instructions (Signed)
[facility] at [facility] [address] [place], [place] [address] [phone]

## 2021-01-22 ENCOUNTER — Telehealth: Payer: Self-pay | Admitting: Surgery

## 2021-01-22 NOTE — Telephone Encounter (Signed)
Patient just called to say he was sorry for giving Dr. Dahlia Byes such a hard time yesterday.  But after the injection he is now feeling much better, his pain level is now down to a 2 and will follow up on 11/21.

## 2021-01-23 ENCOUNTER — Encounter: Payer: Self-pay | Admitting: Surgery

## 2021-01-23 NOTE — Progress Notes (Signed)
Patient ID: Anthony Cohen, male   DOB: 1961/01/04, 60 y.o.   MRN: [id number]  HPI Anthony Cohen is a 60 y.o. male in consultation at the request of Dr. Erlene Quan for exquisite right inguinal pain.  He reports having right inguinal pain for the last 3 weeks.  Pain is sharp and severe and worsening with certain hip movements. 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  CBC , cmp was normal and hemoglobin A1c was normal last time. Also had an ultrasound showing no evidence of torsion or other GU pathology  HPI  PMH Anxiety DM hyperlipidemia  Past Surgical History:  Procedure Laterality Date   NO PAST SURGERIES      Family History  Problem Relation Age of Onset   Hypertension Mother    Breast cancer Mother     Social History Social History   Tobacco Use   Smoking status: Former    Packs/day: 1.00    Years: 3.00    Pack years: 3.00    Types: Cigarettes    Start date: 03/28/2015    Quit date: 03/29/2015    Years since quitting: 5.8   Smokeless tobacco: Never  Vaping Use   Vaping Use: Never used  Substance Use Topics   Alcohol use: Yes    Alcohol/week: 0.0 standard drinks   Drug use: No    No Active Allergies  Current Outpatient Medications  Medication Sig Dispense Refill   buPROPion (WELLBUTRIN XL) 150 MG 24 hr tablet Take 3 tablets (450 mg total) by mouth daily. 450 tablet 1   ezetimibe (ZETIA) 10 MG tablet Take 1 tablet (10 mg total) by mouth daily. 90 tablet 1   gabapentin (NEURONTIN) 300 MG capsule Take 1 capsule (300 mg total) by mouth 3 (three) times daily. 270 capsule 0   gabapentin (NEURONTIN) 400 MG capsule Take 1 capsule (400 mg total) by mouth 3 (three) times daily. 90 capsule 0   sildenafil (REVATIO) 20 MG tablet TAKE 1-5 TABLETS BY MOUTH DAILY 30 tablet 2   venlafaxine XR (EFFEXOR-XR) 75 MG 24 hr capsule TAKE THREE  CAPSULES BY MOUTH DAILY WITH BREAFAST 270 capsule 0   No current facility-administered medications for this visit.     Review of Systems Full ROS  was asked and was negative except for the information on the HPI  Physical Exam Blood pressure 113/76, pulse 87, temperature 98.9 F (37.2 C), temperature source Oral, height 6' (1.829 m), weight 238 lb (108 kg), SpO2 94 %. CONSTITUTIONAL: NAD, BMI 32. EYES: Pupils are equal, round, , Sclera are non-icteric. EARS, NOSE, MOUTH AND THROAT: He is wearing a mask. Hearing is intact to voice. LYMPH NODES:  Lymph nodes in the neck are normal. RESPIRATORY:  Lungs are clear. There is normal respiratory effort, with equal breath sounds bilaterally, and without pathologic use of accessory muscles. CARDIOVASCULAR: Heart is regular without murmurs, gallops, or rubs. GI: The abdomen is  soft, Tender Right inguinal area w some fullness within inguinal canal.There are no palpable masses. There is no hepatosplenomegaly. There are normal bowel sounds in all quadrants. GI: no penile lesions, descended Left testicle, tender right testicle with retraction  no obvious masses  MUSCULOSKELETAL: Normal muscle strength and tone. No cyanosis or edema.   SKIN: Turgor is good and there are no pathologic skin lesions or  ulcers. NEUROLOGIC: Motor and sensation is grossly normal. Cranial nerves are grossly intact. PSYCH:  Oriented to person, place and time. Affect is normal.  Data Reviewed  I have personally reviewed the patient's imaging, laboratory findings and medical records.    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  He does attribute some strenuous activity recently.  This could certainly be related to a sports hernia.  Discussed with the patient in detail and I would like to manage him medically with anti-inflammatories, increasing his gabapentin and placing ice.  We will also make a referral  for physical therapy and evaluate him in 3 weeks.  In addition to this he is interested in an ilioinguinal nerve block that would provide significant relief.  Discussed with the patient in detail and he is in agreement. Also discussed with him the role of inguinal hernia repair in the setting of " sports hernia".  My preference will be to perform physical therapy and medical therapy first before entertaining surgical intervention Time spent in this encounter was 60 minutes,  including counseling and care coordination, reviewing images , coordinating his care   Procedure Note Right ilioinguinal nerve block  Anesthesia: Marcaine quarter percent with epinephrine and 40 mg of Kenalog  Complications: none from block  EBL minimal  After informed consent patient was prepped and draped in the usual sterile fashion.  Anterior to the ASIS a right ilioinguinal nerve block was performed using above combination of anesthetic. . pt developed an acute anxiety attack and vasovagal symptoms related to injection but not related to Marcaine   .  She was observed for about 20 minutes and did okay.  He recovered from his anxiety attack.  He actually had significant relief of pain from the ilioinguinal nerve block.  We will plan to see me in a few weeks  Caroleen Hamman, MD [facility] 01/23/2021, 12:20 PM

## 2021-01-28 ENCOUNTER — Ambulatory Visit: Payer: Medicare Other | Admitting: Urology

## 2021-02-03 ENCOUNTER — Ambulatory Visit: Payer: Medicare Other | Admitting: Physical Therapy

## 2021-02-09 ENCOUNTER — Other Ambulatory Visit: Payer: Self-pay

## 2021-02-09 ENCOUNTER — Ambulatory Visit (INDEPENDENT_AMBULATORY_CARE_PROVIDER_SITE_OTHER): Payer: Medicare Other | Admitting: Surgery

## 2021-02-09 ENCOUNTER — Encounter: Payer: Self-pay | Admitting: Surgery

## 2021-02-09 VITALS — BP 131/82 | HR 75 | Temp 98.4°F | Ht 72.0 in | Wt 236.0 lb

## 2021-02-09 DIAGNOSIS — R1031 Right lower quadrant pain: Secondary | ICD-10-CM

## 2021-02-09 NOTE — Patient Instructions (Addendum)
If you have any concerns or questions, please feel free to call our office. See follow up appointment below. 

## 2021-02-10 ENCOUNTER — Ambulatory Visit: Payer: Medicare Other | Attending: Surgery | Admitting: Physical Therapy

## 2021-02-10 ENCOUNTER — Encounter: Payer: Medicare Other | Admitting: Physical Therapy

## 2021-02-11 ENCOUNTER — Encounter: Payer: Self-pay | Admitting: Surgery

## 2021-02-11 NOTE — Progress Notes (Signed)
Outpatient Surgical Follow Up  02/11/2021  MERTON WADLOW is an 60 y.o. male.   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  During the last visit had ilioinguinal nerve block on the right and did very well especially first few days after the block.  The symptoms now have returned but in a much milder form.  No fevers no chills.  I rereviewed the CT scan showing no evidence of a hernia.  I also reinterrogated the patient about potential STDs and he states that he is got a stable partner for the last 20 years.  He did have chlamydia more than 30 years ago.  History reviewed. No pertinent past medical history.  Past Surgical History:  Procedure Laterality Date   NO PAST SURGERIES      Family History  Problem Relation Age of Onset   Hypertension Mother    Breast cancer Mother     Social History:  reports that he quit smoking about 5 years ago. His smoking use included cigarettes. He started smoking about 5 years ago. He has a 3.00 pack-year smoking history. He has never used smokeless tobacco. He reports current alcohol use. He reports that he does not use drugs.  Allergies: No Active Allergies  Medications reviewed.    ROS Full ROS performed and is otherwise negative other than what is stated in HPI   BP 131/82   Pulse 75   Temp 98.4 F (36.9 C) (Oral)   Ht 6' (1.829 m)   Wt 236 lb (107 kg)   SpO2 94%   BMI 32.01 kg/m   Physical Exam CONSTITUTIONAL: NAD, BMI 32. EYES: Pupils are equal, round, , Sclera are non-icteric. EARS, NOSE, MOUTH AND THROAT: He is wearing a mask. Hearing is intact to voice. LYMPH NODES:  Lymph nodes in the neck are normal. RESPIRATORY:  Lungs are clear. There is normal respiratory effort, with equal breath sounds bilaterally, and without pathologic use of accessory muscles. CARDIOVASCULAR: Heart is regular without murmurs,  gallops, or rubs. GI: The abdomen is  soft, Tender Right inguinal area w some fullness within inguinal canal.There are no palpable masses. There is no hepatosplenomegaly. There are normal bowel sounds in all quadrants. GI: no penile lesions, descended Left testicle, tender right testicle with retraction  no obvious masses  MUSCULOSKELETAL: Normal muscle strength and tone. No cyanosis or edema.   SKIN: Turgor is good and there are no pathologic skin lesions or ulcers. NEUROLOGIC: Motor and sensation is grossly normal. Cranial nerves are grossly intact. PSYCH:  Oriented to person, place and time. Affect is normal.      Assessment/Plan: 60 year old male with chronic inguinodynia that responded to ilioinguinal nerve block especially for the first few days.  He wishes to try the physical therapy continue medical management including gabapentin.  He wants to come back in a couple weeks and is hopeful that at that time we may have to repeat the block.  Discussed with the patient about the situation in detail.  Please note that I spent over 30 minutes in this encounter to include coordination of his care, counseling and performing appropriate documentation  Caroleen Hamman, MD [facility]

## 2021-02-17 ENCOUNTER — Ambulatory Visit: Payer: Medicare Other | Admitting: Physical Therapy

## 2021-02-23 ENCOUNTER — Other Ambulatory Visit: Payer: Self-pay

## 2021-02-23 ENCOUNTER — Ambulatory Visit (INDEPENDENT_AMBULATORY_CARE_PROVIDER_SITE_OTHER): Payer: Medicare Other | Admitting: Surgery

## 2021-02-23 ENCOUNTER — Encounter: Payer: Self-pay | Admitting: Surgery

## 2021-02-23 VITALS — BP 133/88 | HR 74 | Temp 98.3°F | Ht 72.0 in | Wt 231.8 lb

## 2021-02-23 DIAGNOSIS — R1031 Right lower quadrant pain: Secondary | ICD-10-CM

## 2021-02-23 MED ORDER — TRIAMCINOLONE ACETONIDE 40 MG/ML IJ SUSP
40.0000 mg | Freq: Once | INTRAMUSCULAR | Status: AC
Start: 2021-02-23 — End: 2021-02-23
  Administered 2021-02-23: 40 mg via INTRAMUSCULAR

## 2021-02-23 NOTE — Patient Instructions (Addendum)
Follow-up with our office in 3-4 weeks.   Please call and ask to speak with a nurse if you develop questions or concerns.   Today, we have done an Ileo-Inguinal Nerve Block today. The medication that we used may last hours to several days. If your pain returns, please call our office so that we may give you the next step in pain relief.  You do not need to keep a bandage over the area where this has been done.  You may shower as you normally do.  Please call our office with any questions or concerns that you have.   Peripheral Nerve Block What is a peripheral nerve block? A peripheral nerve block is a method of using a type of medicine that is injected into an area of the body to numb everything below the injection site (regional anesthetic). The medicine is injected around the nerve that provides feeling to the area where you will have a surgical procedure done. A peripheral nerve block is done so that you do not feel any pain during your procedure. You may be numb for up to 24 hours after your peripheral nerve block is done, depending on the type of medicine used.  What are some reasons for having a peripheral nerve block? You may have a peripheral nerve block to relieve pain associated with many types of procedures, such as surgery on any parts of your limbs, your hip, your shoulder, or your head. A peripheral nerve block may be done if you are not able to receive medicine to make you fall asleep (general anesthetic) during your procedure. What are the risks of a peripheral nerve block? Generally, peripheral nerve blocks are safe. However, problems may occur, including: Infection at the injection site. Bleeding. Allergic reactions to medicines. Damage to other structures or organs, such as the nerve that is being blocked. Nerve damage can be temporary or permanent. Bruising. Pain. What are the benefits of a peripheral nerve block? The main benefit of a peripheral nerve block is that  you will not feel pain during your procedure, and you will not be exposed to the risks associated with receiving a general anesthetic. Other benefits may include: Reduced need for pain medicine after your procedure. Fewer side effects from pain medicine that you take after your procedure. Lower risk of blood clots. Faster recovery. How is a peripheral nerve block performed? Your nerve is located by exam. The area near your injection site will be cleaned with a germ-killing (antiseptic) solution. Medicine to numb your injection area (local anesthetic) may be injected into the tissue above your nerve. Regional anesthetic will be injected into the area near your nerve. The medicine will be injected around the nerve, not into it. You should not feel any pain during this injection. The procedure may vary among health care providers and hospitals. How can I expect to feel after a peripheral nerve block? The area where the medicine is injected will be completely numb. You should not feel any pain during your procedure. The area of the peripheral nerve block may continue to feel numb after surgery. As the medicine wears off, feeling will gradually return to the area. When you have a numb body part, you have a greater risk of injuring it because you have no sensation to tell you when something hurts. To reduce your risk of injury when you have a peripheral nerve block: Be very careful when exposing numb body parts to heat or cold. Do not lift heavy items.  Do not stand up or try to walk without help if you have a nerve block in one or both legs. Seek medical care if: You develop redness, swelling, or pain around your injection site. You have fluid or blood coming from your injection site. Your injection site feels warm to the touch. You have pus or a bad smell coming from your injection site. You have pain near your injection site that gets worse. You continue to have numbness, weakness, or tingling  after your medicine has worn off. This information is not intended to replace advice given to you by your health care provider. Make sure you discuss any questions you have with your health care provider. Document Released: 06/15/2007 Document Revised: 02/03/2016 Document Reviewed: 11/12/2014 Elsevier Interactive Patient Education  2017 [employer].

## 2021-02-24 ENCOUNTER — Encounter: Payer: Medicare Other | Admitting: Physical Therapy

## 2021-02-25 NOTE — Progress Notes (Signed)
Outpatient Surgical Follow Up  02/25/2021  Anthony Cohen is an 60 y.o. male.   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  During the last visit had ilioinguinal nerve block on the right and did very well especially first few days after the block.  The symptoms now have returned but in a much milder form.  No fevers no chills.  I rereviewed the CT scan showing no evidence of a hernia   Past Surgical History:  Procedure Laterality Date   NO PAST SURGERIES      Family History  Problem Relation Age of Onset   Hypertension Mother    Breast cancer Mother     Social History:  reports that he quit smoking about 5 years ago. His smoking use included cigarettes. He started smoking about 5 years ago. He has a 3.00 pack-year smoking history. He has never used smokeless tobacco. He reports current alcohol use. He reports that he does not use drugs.  Allergies: No Active Allergies  Medications reviewed.    ROS Full ROS performed and is otherwise negative other than what is stated in HPI   BP 133/88   Pulse 74   Temp 98.3 F (36.8 C)   Ht 6' (1.829 m)   Wt 231 lb 12.8 oz (105.1 kg)   SpO2 96%   BMI 31.44 kg/m   Physical Exam Vitals and nursing note reviewed. Exam conducted with a chaperone present.  Constitutional:      Appearance: Normal appearance. He is obese. He is not ill-appearing.  Pulmonary:     Effort: Pulmonary effort is normal. No respiratory distress.     Breath sounds: Normal breath sounds. No stridor.  Abdominal:     General: Abdomen is flat. There is no distension.     Palpations: Abdomen is soft. There is no mass.     Tenderness: There is no abdominal tenderness. There is no guarding or rebound.     Hernia: No hernia is present.     Comments: Tender right inguinal region, no infection , no hernias  Musculoskeletal:        General: Normal range of motion.   Skin:    General: Skin is warm and dry.     Capillary Refill: Capillary refill takes less than 2 seconds.  Neurological:     General: No focal deficit present.     Mental Status: He is alert and oriented to person, place, and time.  Psychiatric:        Mood and Affect: Mood normal.        Behavior: Behavior normal.        Thought Content: Thought content normal.        Judgment: Judgment normal.    Assessment/Plan: 60 year old male with chronic inguinodynia.  He is partially responded to medical management.  Discussed with patient in detail about continuation of ice and gabapentin and as needed anti-inflammatories.  He wishes to do another ilioinguinal nerve block today.   I Discussed with him in detail about the procedure.  Risks, benefits and possible implications.  Greater than 50% of the 30 minutes  visit was spent in counseling/coordination of care  Procedure Note Right ilioinguinal nerve block   Anesthesia: Marcaine quarter percent with epinephrine 20cc and 40 mg of Kenalog   Complications: none from block   EBL minimal   After informed consent patient was prepped  and draped in the usual sterile fashion.  Anterior to the ASIS a right ilioinguinal nerve block was performed using above combination of anesthetic. . NO complications and tolerated procedure well.Marland Kitchen  He actually had significant relief of pain from the ilioinguinal nerve block.  We will plan to see me in a few weeks    Caroleen Hamman, MD [facility]

## 2021-02-28 ENCOUNTER — Other Ambulatory Visit: Payer: Self-pay | Admitting: Internal Medicine

## 2021-02-28 DIAGNOSIS — F3341 Major depressive disorder, recurrent, in partial remission: Secondary | ICD-10-CM

## 2021-02-28 NOTE — Telephone Encounter (Signed)
Requested Prescriptions  Pending Prescriptions Disp Refills  . venlafaxine XR (EFFEXOR-XR) 75 MG 24 hr capsule [Pharmacy Med Name: VENLAFAXINE HCL ER 75 MG CAP] 270 capsule 0    Sig: TAKE THREE CAPSULES BY MOUTH DAILY WITH BREAKFAST     Psychiatry: Antidepressants - SNRI - desvenlafaxine & venlafaxine Failed - 02/28/2021  6:51 AM      Failed - LDL in normal range and within 360 days    LDL Chol Calc (NIH)  Date Value Ref Range Status  09/15/2020 129 (H) 0 - 99 mg/dL Final         Failed - Total Cholesterol in normal range and within 360 days    Cholesterol, Total  Date Value Ref Range Status  09/15/2020 222 (H) 100 - 199 mg/dL Final         Failed - Triglycerides in normal range and within 360 days    Triglycerides  Date Value Ref Range Status  09/15/2020 203 (H) 0 - 149 mg/dL Final         Passed - Completed PHQ-2 or PHQ-9 in the last 360 days      Passed - Last BP in normal range    BP Readings from Last 1 Encounters:  02/23/21 133/88         Passed - Valid encounter within last 6 months    Recent Outpatient Visits          1 month ago Type II diabetes mellitus with complication [facility])   [facility] Glean Hess, MD   5 months ago Type II diabetes mellitus with complication [facility])   [facility] Glean Hess, MD   8 months ago Restless leg syndrome   [facility] Glean Hess, MD   8 months ago Type II diabetes mellitus with complication [facility])   [facility] Glean Hess, MD   1 year ago Cervical radiculopathy   [facility] Glean Hess, MD      Future Appointments            In 2 months Army Melia Jesse Sans, MD [facility]

## 2021-03-02 ENCOUNTER — Ambulatory Visit: Payer: Medicare Other | Admitting: Surgery

## 2021-03-03 ENCOUNTER — Encounter: Payer: Medicare Other | Admitting: Physical Therapy

## 2021-03-04 ENCOUNTER — Ambulatory Visit (INDEPENDENT_AMBULATORY_CARE_PROVIDER_SITE_OTHER): Payer: Medicare Other | Admitting: Surgery

## 2021-03-04 ENCOUNTER — Other Ambulatory Visit: Payer: Self-pay

## 2021-03-04 ENCOUNTER — Encounter: Payer: Self-pay | Admitting: Surgery

## 2021-03-04 VITALS — BP 137/87 | HR 96 | Temp 98.8°F | Ht 71.0 in | Wt 231.4 lb

## 2021-03-04 DIAGNOSIS — R1031 Right lower quadrant pain: Secondary | ICD-10-CM | POA: Diagnosis not present

## 2021-03-04 NOTE — Patient Instructions (Signed)
A referral has been placed to [facility]. They will call you for an appointment.   If you have any concerns or questions, please feel free to call our office. Follow up as needed.   Radiofrequency Ablation Radiofrequency ablation is a procedure that is performed to relieve pain. The procedure is often used for back, neck, or arm pain. Radiofrequency ablation involves the use of a machine that creates radio waves to make heat. During the procedure, the heat is applied to the nerve that carries the pain signal. The heat damages the nerve and interferes with the pain signal. Pain relief usually starts about 2 weeks after the procedure and lasts for 6 months to 1 year. Tell a health care provider about: Any allergies you have. All medicines you are taking, including vitamins, herbs, eye drops, creams, and over-the-counter medicines. Any problems you or family members have had with anesthetic medicines. Any bleeding problems you have. Any surgeries you have had. Any medical conditions you have. Whether you are pregnant or may be pregnant. What are the risks? Generally, this is a safe procedure. However, problems may occur, including: Pain or soreness at the injection site. Allergic reaction to medicines given during the procedure. Bleeding. Infection at the injection site. Damage to nerves or blood vessels. What happens before the procedure? When to stop eating and drinking Follow instructions from your health care provider about what you may eat and drink before your procedure. These may include: 8 hours before the procedure Stop eating most foods. Do not eat meat, fried foods, or fatty foods. Eat only light foods, such as toast or crackers. All liquids are okay except energy drinks and alcohol. 6 hours before the procedure Stop eating. Drink only clear liquids, such as water, clear fruit juice, black coffee, plain tea, and sports drinks. Do not drink energy drinks or  alcohol. 2 hours before the procedure Stop drinking all liquids. You may be allowed to take medicine with small sips of water. If you do not follow your health care provider's instructions, your procedure may be delayed or canceled. Medicines Ask your health care provider about: Changing or stopping your regular medicines. This is especially important if you are taking diabetes medicines or blood thinners. Taking medicines such as aspirin and ibuprofen. These medicines can thin your blood. Do not take these medicines unless your health care provider tells you to take them. Taking over-the-counter medicines, vitamins, herbs, and supplements. General instructions Ask your health care provider what steps will be taken to help prevent infection. These steps may include: Removing hair at the procedure site. Washing skin with a germ-killing soap. Taking antibiotic medicine. If you will be going home right after the procedure, plan to have a responsible adult: Take you home from the hospital or clinic. You will not be allowed to drive. Care for you for the time you are told. What happens during the procedure?  You will be awake during the procedure. You will need to be able to talk with the health care provider during the procedure. An IV will be inserted into one of your veins. You will be given one or more of the following: A medicine to help you relax (sedative). A medicine to numb the area (local anesthetic). Your health care provider will insert a radiofrequency needle into the area to be treated. This is done with the help of fluoroscopy. A wire that carries the radio waves (electrode) will be put through the radiofrequency needle. An Dealer  pulse will be sent through the electrode to verify the correct nerve that is causing your pain. You will feel a tingling sensation, and you may have muscle twitching. The tissue around the needle tip will be heated by an electric current that comes  from the radiofrequency machine. This will numb the nerves. The needle will be removed. A bandage (dressing) will be put on the insertion area. The procedure may vary among health care providers and hospitals. What happens after the procedure? Your blood pressure, heart rate, breathing rate, and blood oxygen level will be monitored until you leave the hospital or clinic. Return to your normal activities as told by your health care provider. Ask your health care provider what activities are safe for you. If you were given a sedative during the procedure, it can affect you for several hours. Do not drive or operate machinery until your health care provider says that it is safe. Summary Radiofrequency ablation is a procedure that is performed to relieve pain. The procedure is often used for back, neck, or arm pain. Radiofrequency ablation involves the use of a machine that creates radio waves to make heat. Plan to have a responsible adult take you home from the hospital or clinic. Do not drive or operate machinery until your health care provider says that it is safe. Return to your normal activities as told by your health care provider. Ask your health care provider what activities are safe for you. This information is not intended to replace advice given to you by your health care provider. Make sure you discuss any questions you have with your health care provider. Document Revised: 08/26/2020 Document Reviewed: 08/26/2020 Elsevier Patient Education  Anthony Cohen.

## 2021-03-07 NOTE — Progress Notes (Signed)
Outpatient Surgical Follow Up  03/07/2021  Anthony Cohen is an 60 y.o. male.   Chief Complaint  Patient presents with   Follow-up    Right inguinal pain    HPI: Anthony Cohen is a 60 year old male well-known to me with a history of right high riding testicle and chronic right inguinodynia.  CT scan again personally reviewed showing no evidence of a true inguinal defect.  I have performed 2 ilioinguinal nerve blocks with local anesthetic And Steroid.  He 2 to 3-day relief with the last block.  He continues to complain of right inguinal pain.  Pain is moderate to severe.  No specific alleviating or aggravating factors.  History reviewed. No pertinent past medical history.  Past Surgical History:  Procedure Laterality Date   NO PAST SURGERIES      Family History  Problem Relation Age of Onset   Hypertension Mother    Breast cancer Mother     Social History:  reports that he quit smoking about 5 years ago. His smoking use included cigarettes. He started smoking about 5 years ago. He has a 3.00 pack-year smoking history. He has never used smokeless tobacco. He reports current alcohol use. He reports that he does not use drugs.  Allergies: No Active Allergies  Medications reviewed.    ROS Full ROS performed and is otherwise negative other than what is stated in HPI   BP 137/87    Pulse 96    Temp 98.8 F (37.1 C) (Oral)    Ht 5\' 11"  (1.803 m)    Wt 231 lb 6.4 oz (105 kg)    SpO2 97%    BMI 32.27 kg/m   Physical Exam Vitals and nursing note reviewed. Exam conducted with a chaperone present.  Constitutional:      General: He is not in acute distress.    Appearance: Normal appearance. He is obese. He is not ill-appearing or toxic-appearing.  Pulmonary:     Effort: Pulmonary effort is normal. No respiratory distress.     Breath sounds: No stridor.  Abdominal:     General: Abdomen is flat. There is no distension.     Palpations: Abdomen is soft. There is no mass.      Tenderness: There is no abdominal tenderness. There is no guarding or rebound.     Hernia: No hernia is present.     Comments: He is tender to palpation at the external inguinal ring.  He does have a high riding testicle.  No peritonitis and no evidence of torsion  Genitourinary:    Penis: Normal.      Comments: High riding testicle on the right side Musculoskeletal:     Cervical back: Normal range of motion and neck supple. No rigidity or tenderness.  Skin:    General: Skin is warm and dry.     Capillary Refill: Capillary refill takes less than 2 seconds.  Neurological:     General: No focal deficit present.     Mental Status: He is alert.  Psychiatric:        Mood and Affect: Mood normal.        Behavior: Behavior normal.        Thought Content: Thought content normal.        Judgment: Judgment normal.     Assessment/Plan: 60 year old male with chronic right inguinodynia that has only partially responded to 2 ilioinguinal nerve blocks, ice gabapentin and anti-inflammatories. There is not a true inguinal defect.  Discussed with him the  options and I do think that before doing any intervention we should explore the possibility for nerve ablation I have made appropriate referral for pain doctor that we will do ablation of the ilioinguinal nerve.  We will only reserve inguinal hernia repair for a "sports "hernia as the last resource.  I explained this to the patient and he seems to understand. Please note that I spent greater then 30 minutes in this encounter including counseling the patient, placing order and coordinating his care and performing appropriate documentation    Caroleen Hamman, MD [facility]

## 2021-03-10 ENCOUNTER — Encounter: Payer: Medicare Other | Admitting: Physical Therapy

## 2021-03-11 ENCOUNTER — Telehealth: Payer: Self-pay

## 2021-03-11 NOTE — Telephone Encounter (Signed)
Faxed referral to [facility] at [phone].

## 2021-03-17 ENCOUNTER — Encounter: Payer: Medicare Other | Admitting: Physical Therapy

## 2021-03-25 ENCOUNTER — Ambulatory Visit: Payer: Medicare Other | Admitting: Surgery

## 2021-04-23 ENCOUNTER — Other Ambulatory Visit: Payer: Self-pay | Admitting: Internal Medicine

## 2021-04-23 DIAGNOSIS — F3341 Major depressive disorder, recurrent, in partial remission: Secondary | ICD-10-CM

## 2021-04-23 NOTE — Telephone Encounter (Signed)
Requested Prescriptions  Pending Prescriptions Disp Refills   buPROPion (WELLBUTRIN XL) 150 MG 24 hr tablet [Pharmacy Med Name: buPROPion HCL XL 150 MG TABLET] 450 tablet 1    Sig: TAKE THREE TABLETS BY MOUTH DAILY  BY MOUTH DAILY     Psychiatry: Antidepressants - bupropion Passed - 04/23/2021  2:19 PM      Passed - Cr in normal range and within 360 days    Creatinine, Ser  Date Value Ref Range Status  06/09/2020 0.90 0.76 - 1.27 mg/dL Final         Passed - AST in normal range and within 360 days    AST  Date Value Ref Range Status  06/09/2020 32 0 - 40 IU/L Final         Passed - ALT in normal range and within 360 days    ALT  Date Value Ref Range Status  06/09/2020 38 0 - 44 IU/L Final         Passed - Completed PHQ-2 or PHQ-9 in the last 360 days      Passed - Last BP in normal range    BP Readings from Last 1 Encounters:  03/04/21 137/87         Passed - Valid encounter within last 6 months    Recent Outpatient Visits          3 months ago Type II diabetes mellitus with complication [facility])   [facility] Glean Hess, MD   7 months ago Type II diabetes mellitus with complication [facility])   [facility] Glean Hess, MD   9 months ago Restless leg syndrome   [place], [place]   10 months ago Type II diabetes mellitus with complication [facility])   [facility] Glean Hess, MD   1 year ago Cervical radiculopathy   [facility] Glean Hess, MD      Future Appointments            In 1 week Army Melia Jesse Sans, MD [facility]

## 2021-04-29 ENCOUNTER — Other Ambulatory Visit: Payer: Self-pay | Admitting: Anesthesiology

## 2021-04-29 DIAGNOSIS — M25559 Pain in unspecified hip: Secondary | ICD-10-CM

## 2021-04-29 DIAGNOSIS — R1031 Right lower quadrant pain: Secondary | ICD-10-CM

## 2021-05-01 ENCOUNTER — Other Ambulatory Visit: Payer: Self-pay

## 2021-05-01 ENCOUNTER — Ambulatory Visit: Payer: Medicare Other

## 2021-05-01 DIAGNOSIS — Q666 Other congenital valgus deformities of feet: Secondary | ICD-10-CM

## 2021-05-01 NOTE — Progress Notes (Signed)
Attempted to fit functional foot orthotics fabricated by [facility], devices do not match the contour of patient's feet and hurt him to ambulate in. Recasted patient to have custom vacuum formed foot orthotics manufactured by [employer]. Patient to be called when re-do order is ready. All questions answered and concerns addressed.

## 2021-05-04 ENCOUNTER — Encounter: Payer: Self-pay | Admitting: Internal Medicine

## 2021-05-04 ENCOUNTER — Other Ambulatory Visit: Payer: Self-pay

## 2021-05-04 ENCOUNTER — Ambulatory Visit (INDEPENDENT_AMBULATORY_CARE_PROVIDER_SITE_OTHER): Payer: Medicare Other | Admitting: Internal Medicine

## 2021-05-04 VITALS — BP 136/80 | HR 81 | Ht 71.0 in | Wt 236.0 lb

## 2021-05-04 DIAGNOSIS — E1169 Type 2 diabetes mellitus with other specified complication: Secondary | ICD-10-CM | POA: Diagnosis not present

## 2021-05-04 DIAGNOSIS — R1031 Right lower quadrant pain: Secondary | ICD-10-CM

## 2021-05-04 DIAGNOSIS — F3341 Major depressive disorder, recurrent, in partial remission: Secondary | ICD-10-CM | POA: Diagnosis not present

## 2021-05-04 DIAGNOSIS — K719 Toxic liver disease, unspecified: Secondary | ICD-10-CM

## 2021-05-04 DIAGNOSIS — E785 Hyperlipidemia, unspecified: Secondary | ICD-10-CM

## 2021-05-04 DIAGNOSIS — E118 Type 2 diabetes mellitus with unspecified complications: Secondary | ICD-10-CM | POA: Diagnosis not present

## 2021-05-04 DIAGNOSIS — T466X5A Adverse effect of antihyperlipidemic and antiarteriosclerotic drugs, initial encounter: Secondary | ICD-10-CM | POA: Insufficient documentation

## 2021-05-04 NOTE — Progress Notes (Signed)
Date:  05/04/2021   Name:  Anthony Cohen   DOB:  27-May-1960   MRN:  [id number]   Chief Complaint: Depression and Diabetes (Foot Exam. MICRO.)  Diabetes He presents for his follow-up diabetic visit. He has type 2 diabetes mellitus. Pertinent negatives for hypoglycemia include no headaches, nervousness/anxiousness or tremors. Pertinent negatives for diabetes include no chest pain, no fatigue, no polydipsia and no polyuria.  Depression        This is a chronic problem.The problem is unchanged.  Associated symptoms include no fatigue, no appetite change and no headaches.  Past treatments include SNRIs - Serotonin and norepinephrine reuptake inhibitors and other medications. Hyperlipidemia This is a chronic problem. Recent lipid tests were reviewed and are high. Pertinent negatives include no chest pain or shortness of breath. Current antihyperlipidemic treatment includes ezetimibe.  Groin/testicular pain - ongoing issues; s/p injections in the nerve by [facility] that only gave relief for 4 days.  Now seeing [facility].  MRI scheduled.  Still unsure of the cause - may be right hip, may be pinched nerve.  He is getting frustrated with the prolonged wait.  Taking high dose Advil daily.  Lab Results  Component Value Date   NA 140 06/09/2020   K 5.1 06/09/2020   CO2 25 06/09/2020   GLUCOSE 89 06/09/2020   BUN 20 06/09/2020   CREATININE 0.90 06/09/2020   CALCIUM 10.0 06/09/2020   EGFR 98 06/09/2020   GFRNONAA >60 09/21/2019   Lab Results  Component Value Date   CHOL 222 (H) 09/15/2020   HDL 57 09/15/2020   LDLCALC 129 (H) 09/15/2020   TRIG 203 (H) 09/15/2020   CHOLHDL 3.9 09/15/2020   Lab Results  Component Value Date   TSH 2.240 03/10/2015   Lab Results  Component Value Date   HGBA1C 5.9 (H) 09/15/2020   Lab Results  Component Value Date   WBC 4.8 09/15/2020   HGB 14.4 09/15/2020   HCT 43.5 09/15/2020   MCV 95 09/15/2020   PLT 187 09/15/2020   Lab Results   Component Value Date   ALT 38 06/09/2020   AST 32 06/09/2020   ALKPHOS 85 06/09/2020   BILITOT 0.5 06/09/2020   No results found for: 25OHVITD2, 25OHVITD3, VD25OH   Review of Systems  Constitutional:  Negative for appetite change, fatigue and unexpected weight change.  Eyes:  Negative for visual disturbance.  Respiratory:  Negative for cough, shortness of breath and wheezing.   Cardiovascular:  Negative for chest pain, palpitations and leg swelling.  Gastrointestinal:  Negative for abdominal pain and blood in stool.  Endocrine: Negative for polydipsia and polyuria.  Genitourinary:  Positive for testicular pain (and groin pain). Negative for dysuria and hematuria.  Skin:  Negative for color change and rash.  Neurological:  Negative for tremors, numbness and headaches.  Psychiatric/Behavioral:  Positive for depression. Negative for dysphoric mood and sleep disturbance. The patient is not nervous/anxious.    Patient Active Problem List   Diagnosis Date Noted   Erectile dysfunction 06/09/2020   Pinched nerve in shoulder, left 09/11/2019   Pes planus, congenital 05/26/2018   Restless leg syndrome 05/26/2018   Arthritis of knee 10/25/2016   GERD without esophagitis 04/20/2016   Type II diabetes mellitus with complication ([place]) 42/70/6237   Hyperlipidemia associated with type 2 diabetes mellitus ([place]) 06/16/2015   OSA (obstructive sleep apnea) 03/10/2015   Recurrent major depressive disorder, in partial remission ([place]) 02/19/2015   Family history of  digestive disorder 02/19/2015   Tobacco use disorder, mild, in sustained remission 02/19/2015    No Active Allergies  Past Surgical History:  Procedure Laterality Date   NO PAST SURGERIES      Social History   Tobacco Use   Smoking status: Former    Packs/day: 1.00    Years: 3.00    Pack years: 3.00    Types: Cigarettes    Start date: 03/28/2015    Quit date: 03/29/2015    Years since quitting: 6.1   Smokeless tobacco: Never   Vaping Use   Vaping Use: Never used  Substance Use Topics   Alcohol use: Yes    Alcohol/week: 0.0 standard drinks   Drug use: No     Medication list has been reviewed and updated.  Current Meds  Medication Sig   buPROPion (WELLBUTRIN XL) 150 MG 24 hr tablet TAKE THREE TABLETS BY MOUTH DAILY  BY MOUTH DAILY   ezetimibe (ZETIA) 10 MG tablet Take 1 tablet (10 mg total) by mouth daily.   gabapentin (NEURONTIN) 400 MG capsule Take 1 capsule (400 mg total) by mouth 3 (three) times daily. (Patient taking differently: Take 400 mg by mouth 2 (two) times daily.)   sildenafil (REVATIO) 20 MG tablet TAKE 1-5 TABLETS BY MOUTH DAILY   venlafaxine XR (EFFEXOR-XR) 75 MG 24 hr capsule TAKE THREE CAPSULES BY MOUTH DAILY WITH BREAKFAST    PHQ 2/9 Scores 05/04/2021 12/31/2020 09/15/2020 07/03/2020  PHQ - 2 Score 0 0 0 0  PHQ- 9 Score 0 1 1 0    GAD 7 : Generalized Anxiety Score 05/04/2021 12/31/2020 09/15/2020 07/03/2020  Nervous, Anxious, on Edge 0 1 0 0  Control/stop worrying 0 0 0 0  Worry too much - different things 0 0 0 0  Trouble relaxing 0 0 0 0  Restless 0 0 0 0  Easily annoyed or irritable 0 1 0 0  Afraid - awful might happen 0 0 0 0  Total GAD 7 Score 0 2 0 0  Anxiety Difficulty Not difficult at all Not difficult at all Not difficult at all -    BP Readings from Last 3 Encounters:  05/04/21 136/80  03/04/21 137/87  02/23/21 133/88    Physical Exam Vitals and nursing note reviewed.  Constitutional:      General: He is not in acute distress.    Appearance: He is well-developed.  HENT:     Head: Normocephalic and atraumatic.  Cardiovascular:     Rate and Rhythm: Normal rate and regular rhythm.     Pulses: Normal pulses.     Heart sounds: No murmur heard. Pulmonary:     Effort: Pulmonary effort is normal. No respiratory distress.     Breath sounds: No wheezing or rhonchi.  Musculoskeletal:     Cervical back: Normal range of motion.     Right hip: Decreased range of motion.      Comments: Pain in right groin with hip flexion, stretching   Lymphadenopathy:     Cervical: No cervical adenopathy.  Skin:    General: Skin is warm and dry.     Findings: No rash.  Neurological:     Mental Status: He is alert and oriented to person, place, and time.  Psychiatric:        Mood and Affect: Mood normal.        Behavior: Behavior normal.    Wt Readings from Last 3 Encounters:  05/04/21 236 lb (107 kg)  03/04/21 231  lb 6.4 oz (105 kg)  02/23/21 231 lb 12.8 oz (105.1 kg)    BP 136/80    Pulse 81    Ht 5' 11"  (1.803 m)    Wt 236 lb (107 kg)    SpO2 96%    BMI 32.92 kg/m   Assessment and Plan: 1. Type II diabetes mellitus with complication (HCC) He is controlling with diet and exercise as able. Gained a few lbs.  No known new problems. No eye exam recently - pt encouraged to schedule. Does not check BS. - Comprehensive metabolic panel - Hemoglobin A1c - Microalbumin / creatinine urine ratio  2. Hyperlipidemia associated with type 2 diabetes mellitus ([place]) On Zetia with some improvement in lipids.  He declines statin therapy. Declines to take Statins - Comprehensive metabolic panel - Lipid panel  3. Recurrent major depressive disorder, in partial remission (HCC) Clinically stable on current regimen with good control of symptoms, No SI or HI. Will continue current therapy with Effexor and bupropion. - TSH  4. Right inguinal pain Work up on-going. MRI in the next few weeks. Continue Advil for pain.   Partially dictated using Editor, commissioning. Any errors are unintentional.  Halina Maidens, MD [facility]  05/04/2021

## 2021-05-05 LAB — HEMOGLOBIN A1C
Est. average glucose Bld gHb Est-mCnc: 117 mg/dL
Hgb A1c MFr Bld: 5.7 % — ABNORMAL HIGH (ref 4.8–5.6)

## 2021-05-05 LAB — LIPID PANEL
Chol/HDL Ratio: 4.1 ratio (ref 0.0–5.0)
Cholesterol, Total: 226 mg/dL — ABNORMAL HIGH (ref 100–199)
HDL: 55 mg/dL (ref >39)
LDL Chol Calc (NIH): 115 mg/dL — ABNORMAL HIGH (ref 0–99)
Triglycerides: 324 mg/dL — ABNORMAL HIGH (ref 0–149)
VLDL Cholesterol Cal: 56 mg/dL — ABNORMAL HIGH (ref 5–40)

## 2021-05-05 LAB — COMPREHENSIVE METABOLIC PANEL
ALT: 31 IU/L (ref 0–44)
AST: 30 IU/L (ref 0–40)
Albumin/Globulin Ratio: 1.9 (ref 1.2–2.2)
Albumin: 4.6 g/dL (ref 3.8–4.9)
Alkaline Phosphatase: 71 IU/L (ref 44–121)
BUN/Creatinine Ratio: 29 — ABNORMAL HIGH (ref 10–24)
BUN: 20 mg/dL (ref 8–27)
Bilirubin Total: 0.3 mg/dL (ref 0.0–1.2)
CO2: 23 mmol/L (ref 20–29)
Calcium: 9 mg/dL (ref 8.6–10.2)
Chloride: 103 mmol/L (ref 96–106)
Creatinine, Ser: 0.69 mg/dL — ABNORMAL LOW (ref 0.76–1.27)
Globulin, Total: 2.4 g/dL (ref 1.5–4.5)
Glucose: 117 mg/dL — ABNORMAL HIGH (ref 70–99)
Potassium: 4.5 mmol/L (ref 3.5–5.2)
Sodium: 141 mmol/L (ref 134–144)
Total Protein: 7 g/dL (ref 6.0–8.5)
eGFR: 106 mL/min/{1.73_m2} (ref >59)

## 2021-05-05 LAB — TSH: TSH: 2.11 u[IU]/mL (ref 0.450–4.500)

## 2021-05-05 LAB — MICROALBUMIN / CREATININE URINE RATIO
Creatinine, Urine: 109.5 mg/dL
Microalb/Creat Ratio: 8 mg/g creat (ref 0–29)
Microalbumin, Urine: 9.3 ug/mL

## 2021-05-11 ENCOUNTER — Ambulatory Visit: Admission: RE | Admit: 2021-05-11 | Payer: Medicare Other | Source: Ambulatory Visit

## 2021-05-11 ENCOUNTER — Encounter: Payer: Self-pay | Admitting: Surgery

## 2021-05-11 ENCOUNTER — Ambulatory Visit (INDEPENDENT_AMBULATORY_CARE_PROVIDER_SITE_OTHER): Payer: Medicare Other | Admitting: Surgery

## 2021-05-11 ENCOUNTER — Other Ambulatory Visit: Payer: Self-pay

## 2021-05-11 VITALS — BP 142/97 | HR 106 | Temp 97.9°F | Resp 16 | Ht 71.0 in | Wt 231.0 lb

## 2021-05-11 DIAGNOSIS — R1031 Right lower quadrant pain: Secondary | ICD-10-CM | POA: Diagnosis not present

## 2021-05-12 ENCOUNTER — Ambulatory Visit
Admission: RE | Admit: 2021-05-12 | Discharge: 2021-05-12 | Disposition: A | Discharge status: Home / Self Care | Payer: Medicare Other | Source: Ambulatory Visit | Attending: Anesthesiology | Admitting: Anesthesiology

## 2021-05-12 DIAGNOSIS — R1031 Right lower quadrant pain: Secondary | ICD-10-CM | POA: Diagnosis present

## 2021-05-12 DIAGNOSIS — M25559 Pain in unspecified hip: Secondary | ICD-10-CM | POA: Insufficient documentation

## 2021-05-13 NOTE — Progress Notes (Signed)
Outpatient Surgical Follow Up  05/13/2021  Anthony Cohen is an 61 y.o. male.   Chief Complaint  Patient presents with   Follow-up    R Inguinal Pain    HPI: Anthony Cohen is 61 year old male well-known to me with history of right inguinal chronic pain.  Etiology still to be determined.  He only responded briefly with an ilioinguinal nerve block.  No evidence of inguinal hernias.  Now he reports that the pain is more towards his right hip.  He has been seen pain specialist and has a pending MRI of the pelvis. He continues to endorse significant moderate to severe pain on the right side  History reviewed. No pertinent past medical history.  Past Surgical History:  Procedure Laterality Date   NO PAST SURGERIES      Family History  Problem Relation Age of Onset   Hypertension Mother    Breast cancer Mother     Social History:  reports that he quit smoking about 6 years ago. His smoking use included cigarettes. He started smoking about 6 years ago. He has a 3.00 pack-year smoking history. He has never used smokeless tobacco. He reports current alcohol use. He reports that he does not use drugs.  Allergies: No Known Allergies  Medications reviewed.    ROS Full ROS performed and is otherwise negative other than what is stated in HPI   BP (!) 142/97    Pulse (!) 106    Temp 97.9 F (36.6 C) (Oral)    Resp 16    Ht 5\' 11"  (1.803 m)    Wt 231 lb (104.8 kg)    SpO2 100%    BMI 32.22 kg/m   Physical Exam Vitals and nursing note reviewed.  Constitutional:      Appearance: He is obese. He is not ill-appearing.  Pulmonary:     Effort: Pulmonary effort is normal. No respiratory distress.     Breath sounds: Normal breath sounds. No stridor. No wheezing or rhonchi.  Abdominal:     General: Abdomen is flat. There is no distension.     Palpations: Abdomen is soft. There is no mass.     Tenderness: There is no abdominal tenderness. There is no guarding or rebound.     Hernia: No hernia  is present.  Musculoskeletal:        General: No swelling or tenderness. Normal range of motion.     Cervical back: Normal range of motion and neck supple. No rigidity or tenderness.  Skin:    General: Skin is warm and dry.     Capillary Refill: Capillary refill takes less than 2 seconds.  Neurological:     General: No focal deficit present.     Mental Status: He is alert.  Psychiatric:        Mood and Affect: Mood normal.        Behavior: Behavior normal.        Thought Content: Thought content normal.        Judgment: Judgment normal.      Assessment/Plan:  1. Right inguinal pain C chronic in nature.  There is no clear-cut evidence of inguinal hernia.  This has been a challenging situation.  Patient has been referred to pain specialist and has a pending MRI of the pelvis. Surgical perspective not much I can offer to him right now.  I will be happy to follow him up after he completes the MRI or as needed pending his preference   Greater  than 50% of the 20 minutes  visit was spent in counseling/coordination of care   Caroleen Hamman, MD [facility]

## 2021-05-20 ENCOUNTER — Telehealth: Payer: Medicare Other

## 2021-05-20 DIAGNOSIS — M1611 Unilateral primary osteoarthritis, right hip: Secondary | ICD-10-CM | POA: Insufficient documentation

## 2021-05-26 ENCOUNTER — Telehealth: Payer: Self-pay

## 2021-05-26 NOTE — Telephone Encounter (Signed)
Casts sent to central fabrication °

## 2021-05-27 ENCOUNTER — Other Ambulatory Visit: Payer: Self-pay | Admitting: Internal Medicine

## 2021-05-27 DIAGNOSIS — F3341 Major depressive disorder, recurrent, in partial remission: Secondary | ICD-10-CM

## 2021-05-27 NOTE — Telephone Encounter (Signed)
Requested Prescriptions  ?Pending Prescriptions Disp Refills  ?? venlafaxine XR (EFFEXOR-XR) 75 MG 24 hr capsule [Pharmacy Med Name: VENLAFAXINE HCL ER 75 MG CAP] 270 capsule 0  ?  Sig: TAKE THREE CAPSULES BY MOUTH DAILY WITH BREAKFAST  ?  ? Psychiatry: Antidepressants - SNRI - desvenlafaxine & venlafaxine Failed - 05/27/2021  6:21 AM  ?  ?  Failed - Cr in normal range and within 360 days  ?  Creatinine, Ser  ?Date Value Ref Range Status  ?05/04/2021 0.69 (L) 0.76 - 1.27 mg/dL Final  ?   ?  ?  Failed - Last BP in normal range  ?  BP Readings from Last 1 Encounters:  ?05/11/21 (!) 142/97  ?   ?  ?  Failed - Lipid Panel in normal range within the last 12 months  ?  Cholesterol, Total  ?Date Value Ref Range Status  ?05/04/2021 226 (H) 100 - 199 mg/dL Final  ? ?LDL Chol Calc (NIH)  ?Date Value Ref Range Status  ?05/04/2021 115 (H) 0 - 99 mg/dL Final  ? ?HDL  ?Date Value Ref Range Status  ?05/04/2021 55 >39 mg/dL Final  ? ?Triglycerides  ?Date Value Ref Range Status  ?05/04/2021 324 (H) 0 - 149 mg/dL Final  ? ?  ?  ?  Passed - Completed PHQ-2 or PHQ-9 in the last 360 days  ?  ?  Passed - Valid encounter within last 6 months  ?  Recent Outpatient Visits   ?      ? 3 weeks ago Type II diabetes mellitus with complication ([place])  ? [facility] Glean Hess, MD  ? 4 months ago Type II diabetes mellitus with complication [facility])  ? [facility] Glean Hess, MD  ? 8 months ago Type II diabetes mellitus with complication [facility])  ? [facility] Glean Hess, MD  ? 10 months ago Restless leg syndrome  ? [facility] Glean Hess, MD  ? 11 months ago Type II diabetes mellitus with complication [facility])  ? [facility] Glean Hess, MD  ?  ?  ?Future Appointments   ?        ? In 1 week Pabon, Marjory Lies, MD [facility]  ? In 5 months Army Melia Jesse Sans, MD [facility] [place]  ?  ? ?  ?  ?  ? ?

## 2021-06-08 ENCOUNTER — Ambulatory Visit: Payer: Medicare Other | Admitting: Surgery

## 2021-07-10 ENCOUNTER — Ambulatory Visit: Payer: Medicare Other

## 2021-07-10 DIAGNOSIS — Q666 Other congenital valgus deformities of feet: Secondary | ICD-10-CM

## 2021-07-10 NOTE — Progress Notes (Signed)
SITUATION: ?Reason for Visit: Fitting and Delivery of Custom Fabricated Foot Orthoses ?Patient Report: Patient reports comfort and is satisfied with device. ? ?OBJECTIVE DATA: ?Patient History / Diagnosis:   ?  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  ? ? ?Provided Device:  Custom Functional Foot Orthotics ?    [employer]: [id number] ? ?GOAL OF ORTHOSIS ?- Improve gait ?- Decrease energy expenditure ?- Improve Balance ?- Provide Triplanar stability of foot complex ?- Facilitate motion ? ?ACTIONS PERFORMED ?Patient was fit with foot orthotics trimmed to shoe last. Patient tolerated fittign procedure.  ? ?Patient was provided with verbal and written instruction and demonstration regarding donning, doffing, wear, care, proper fit, function, purpose, cleaning, and use of the orthosis and in all related precautions and risks and benefits regarding the orthosis. ? ?Patient was also provided with verbal instruction regarding how to report any failures or malfunctions of the orthosis and necessary follow up care. Patient was also instructed to contact our office regarding any change in status that may affect the function of the orthosis. ? ?Patient demonstrated independence with proper donning, doffing, and fit and verbalized understanding of all instructions. ? ?PLAN: ?Patient is to follow up in one week or as necessary (PRN). All questions were answered and concerns addressed. Plan of care was discussed with and agreed upon by the patient. ? ?

## 2021-07-21 ENCOUNTER — Other Ambulatory Visit: Payer: Self-pay | Admitting: Internal Medicine

## 2021-07-21 DIAGNOSIS — F3341 Major depressive disorder, recurrent, in partial remission: Secondary | ICD-10-CM

## 2021-07-22 NOTE — Telephone Encounter (Signed)
Requested Prescriptions  ?Pending Prescriptions Disp Refills  ?? buPROPion (WELLBUTRIN XL) 150 MG 24 hr tablet [Pharmacy Med Name: buPROPion HCL XL 150 MG TABLET] 270 tablet 0  ?  Sig: TAKE THREE TABLETS BY MOUTH DAILY  ?  ? Psychiatry: Antidepressants - bupropion Failed - 07/21/2021  3:06 PM  ?  ?  Failed - Cr in normal range and within 360 days  ?  Creatinine, Ser  ?Date Value Ref Range Status  ?05/04/2021 0.69 (L) 0.76 - 1.27 mg/dL Final  ?   ?  ?  Failed - Last BP in normal range  ?  BP Readings from Last 1 Encounters:  ?05/11/21 (!) 142/97  ?   ?  ?  Passed - AST in normal range and within 360 days  ?  AST  ?Date Value Ref Range Status  ?05/04/2021 30 0 - 40 IU/L Final  ?   ?  ?  Passed - ALT in normal range and within 360 days  ?  ALT  ?Date Value Ref Range Status  ?05/04/2021 31 0 - 44 IU/L Final  ?   ?  ?  Passed - Completed PHQ-2 or PHQ-9 in the last 360 days  ?  ?  Passed - Valid encounter within last 6 months  ?  Recent Outpatient Visits   ?      ? 2 months ago Type II diabetes mellitus with complication ([place])  ? [facility] Glean Hess, MD  ? 6 months ago Type II diabetes mellitus with complication [facility])  ? [facility] Glean Hess, MD  ? 10 months ago Type II diabetes mellitus with complication [facility])  ? [facility] Glean Hess, MD  ? 1 year ago Restless leg syndrome  ? [facility] Glean Hess, MD  ? 1 year ago Type II diabetes mellitus with complication [facility])  ? [facility] Glean Hess, MD  ?  ?  ?Future Appointments   ?        ? In 3 months Army Melia Jesse Sans, MD [facility] [place]  ?  ? ?  ?  ?  ? ? ?

## 2021-07-28 ENCOUNTER — Telehealth: Payer: Self-pay | Admitting: Internal Medicine

## 2021-07-28 NOTE — Telephone Encounter (Signed)
Copied from [place]. Topic: Medicare AWV ?>> Jul 28, 2021 10:09 AM Cher Nakai R wrote: ?Reason for CRM: ?Left message for patient to call back and schedule Medicare Annual Wellness Visit (AWV) in office.  ? ?If unable to come into the office for AWV,  please offer to do virtually or by telephone. ? ?No hx of AWV eligible for AWVI per [place] as of  05/20/2021 ? ?Please schedule at anytime with [facility] Health Advisor.     ? ?45 minute appointment  ? ?Any questions, please call me at [phone] ?

## 2021-08-24 ENCOUNTER — Other Ambulatory Visit: Payer: Self-pay | Admitting: Internal Medicine

## 2021-08-24 DIAGNOSIS — F3341 Major depressive disorder, recurrent, in partial remission: Secondary | ICD-10-CM

## 2021-08-25 NOTE — Telephone Encounter (Signed)
Requested Prescriptions  Pending Prescriptions Disp Refills  . venlafaxine XR (EFFEXOR-XR) 75 MG 24 hr capsule [Pharmacy Med Name: VENLAFAXINE HCL ER 75 MG CAP] 270 capsule 0    Sig: TAKE THREE CAPSULES BY MOUTH EVERY MORNING WITH BREAKFAST     Psychiatry: Antidepressants - SNRI - desvenlafaxine & venlafaxine Failed - 08/24/2021  6:22 AM      Failed - Cr in normal range and within 360 days    Creatinine, Ser  Date Value Ref Range Status  05/04/2021 0.69 (L) 0.76 - 1.27 mg/dL Final         Failed - Last BP in normal range    BP Readings from Last 1 Encounters:  05/11/21 (!) 142/97         Failed - Lipid Panel in normal range within the last 12 months    Cholesterol, Total  Date Value Ref Range Status  05/04/2021 226 (H) 100 - 199 mg/dL Final   LDL Chol Calc (NIH)  Date Value Ref Range Status  05/04/2021 115 (H) 0 - 99 mg/dL Final   HDL  Date Value Ref Range Status  05/04/2021 55 >39 mg/dL Final   Triglycerides  Date Value Ref Range Status  05/04/2021 324 (H) 0 - 149 mg/dL Final         Passed - Completed PHQ-2 or PHQ-9 in the last 360 days      Passed - Valid encounter within last 6 months    Recent Outpatient Visits          3 months ago Type II diabetes mellitus with complication [facility])   [facility] Glean Hess, [facility]   7 months ago Type II diabetes mellitus with complication [facility])   [facility], [facility]   11 months ago Type II diabetes mellitus with complication [facility])   [facility]an Hess, MD   1 year ago Restless leg syndrome   [facility]an Hess, MD   1 year ago Type II diabetes mellitus with complication [facility])   [facility] Glean Hess, MD      Future Appointments            In 2 months Army Melia Jesse Sans, MD [facility]

## 2021-10-11 ENCOUNTER — Other Ambulatory Visit: Payer: Self-pay | Admitting: Internal Medicine

## 2021-10-11 DIAGNOSIS — F3341 Major depressive disorder, recurrent, in partial remission: Secondary | ICD-10-CM

## 2021-10-13 NOTE — Telephone Encounter (Signed)
Requested Prescriptions  Pending Prescriptions Disp Refills  . buPROPion (WELLBUTRIN XL) 150 MG 24 hr tablet [Pharmacy Med Name: buPROPion HCL XL 150 MG TABLET] 270 tablet 0    Sig: TAKE THREE TABLETS BY MOUTH DAILY     Psychiatry: Antidepressants - bupropion Failed - 10/11/2021  8:30 PM      Failed - Cr in normal range and within 360 days    Creatinine, Ser  Date Value Ref Range Status  05/04/2021 0.69 (L) 0.76 - 1.27 mg/dL Final         Failed - Last BP in normal range    BP Readings from Last 1 Encounters:  05/11/21 (!) 142/97         Passed - AST in normal range and within 360 days    AST  Date Value Ref Range Status  05/04/2021 30 0 - 40 IU/L Final         Passed - ALT in normal range and within 360 days    ALT  Date Value Ref Range Status  05/04/2021 31 0 - 44 IU/L Final         Passed - Completed PHQ-2 or PHQ-9 in the last 360 days      Passed - Valid encounter within last 6 months    Recent Outpatient Visits          5 months ago Type II diabetes mellitus with complication [facility])   [facility] Glean Hess, MD   9 months ago Type II diabetes mellitus with complication [facility])   [facility] Glean Hess, MD   1 year ago Type II diabetes mellitus with complication [facility])   [facility] Glean Hess, MD   1 year ago Restless leg syndrome   [facility] Glean Hess, MD   1 year ago Type II diabetes mellitus with complication [facility])   [facility] Glean Hess, MD      Future Appointments            In 3 weeks Army Melia Jesse Sans, MD [facility]

## 2021-10-27 ENCOUNTER — Ambulatory Visit
Admission: EM | Admit: 2021-10-27 | Discharge: 2021-10-27 | Disposition: A | Discharge status: Home / Self Care | Payer: Medicare Other | Attending: Emergency Medicine | Admitting: Emergency Medicine

## 2021-10-27 DIAGNOSIS — J069 Acute upper respiratory infection, unspecified: Secondary | ICD-10-CM

## 2021-10-27 MED ORDER — IPRATROPIUM BROMIDE 0.06 % NA SOLN: 2.0000 | Freq: Four times a day (QID) | NASAL | 12 refills | Status: DC

## 2021-10-27 MED ORDER — MOLNUPIRAVIR EUA 200MG CAPSULE: 4.0000 | ORAL_CAPSULE | Freq: Two times a day (BID) | ORAL | 0 refills | Status: AC

## 2021-10-27 NOTE — ED Triage Notes (Signed)
Pt c/o sore throat, congestion, body aches x1day  Pt was here with his husband on Saturday who tested positive for covid.

## 2021-10-27 NOTE — Discharge Instructions (Signed)
You will have to quarantine for 5 days from the start of your symptoms.  After 5 days you can break quarantine if your symptoms have improved and you have not had a fever for 24 hours without taking Tylenol or ibuprofen.  Use over-the-counter Tylenol and ibuprofen as needed for body aches and fever.  Take molnupiravir twice daily for 5 days for treatment of COVID-19.  Use the Atrovent nasal spray as needed for congestion, 2 squirts in each nostril every 6 hours..  If you develop any increased shortness of breath-especially at rest, you are unable to speak in full sentences, or is a late sign your lips are turning blue you need to go the ER for evaluation.

## 2021-10-27 NOTE — ED Provider Notes (Signed)
[facility]    CSN: [id number] Arrival date & time: 10/27/21  0850      History   Chief Complaint Chief Complaint  Patient presents with   Sore Throat   Generalized Body Aches   Nasal Congestion    HPI Anthony Cohen is a 61 y.o. male.   HPI  61 year old male here for evaluation of respiratory complaints.  Patient's husband tested positive for COVID over the weekend and patient reports that this morning he woke up with bodyaches, nasal congestion, sore throat, and yellow nasal discharge.  He denies any fever, ear pain, cough, shortness breath or wheezing, or GI complaints.  History reviewed. No pertinent past medical history.  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   Erectile dysfunction 06/09/2020   Pinched nerve in shoulder, left 09/11/2019   Pes planus, congenital 05/26/2018   Restless leg syndrome 05/26/2018   Arthritis of knee 10/25/2016   GERD without esophagitis 04/20/2016   Type II diabetes mellitus with complication ([place]) 30/16/0109   Hyperlipidemia associated with type 2 diabetes mellitus ([place]) 06/16/2015   OSA (obstructive sleep apnea) 03/10/2015   Recurrent major depressive disorder, in partial remission ([place]) 02/19/2015   Family history of digestive disorder 02/19/2015   Tobacco use disorder, mild, in sustained remission 02/19/2015    Past Surgical History:  Procedure Laterality Date   NO PAST SURGERIES         Home Medications    Prior to Admission medications   Medication Sig Start Date End Date Taking? Authorizing Provider  buPROPion (WELLBUTRIN XL) 150 MG 24 hr tablet TAKE THREE TABLETS BY MOUTH DAILY 10/13/21  Yes Glean Hess, MD  ezetimibe (ZETIA) 10 MG tablet Take 1 tablet (10 mg total) by mouth daily. 12/31/20  Yes Glean Hess, MD  gabapentin (NEURONTIN) 400 MG capsule Take 1 capsule (400 mg total) by mouth 3 (three) times  daily. Patient taking differently: Take 400 mg by mouth 2 (two) times daily. 01/21/21  Yes Pabon, Diego F, MD  ipratropium (ATROVENT) 0.06 % nasal spray Place 2 sprays into both nostrils 4 (four) times daily. 10/27/21  Yes Margarette Canada, NP  molnupiravir EUA (LAGEVRIO) 200 mg CAPS capsule Take 4 capsules (800 mg total) by mouth 2 (two) times daily for 5 days. 10/27/21 11/01/21 Yes Margarette Canada, NP  sildenafil (REVATIO) 20 MG tablet TAKE 1-5 TABLETS BY MOUTH DAILY 12/31/20  Yes Glean Hess, MD  venlafaxine XR (EFFEXOR-XR) 75 MG 24 hr capsule TAKE THREE CAPSULES BY MOUTH EVERY MORNING WITH BREAKFAST 08/25/21  Yes Glean Hess, MD    Family History Family History  Problem Relation Age of Onset   Hypertension Mother    Breast cancer Mother     Social History Social History   Tobacco Use   Smoking status: Former    Packs/day: 1.00    Years: 3.00    Total pack years: 3.00    Types: Cigarettes    Start date: 03/28/2015    Quit date: 03/29/2015    Years since quitting: 6.5   Smokeless tobacco: Never  Vaping Use   Vaping Use: Never used  Substance Use Topics   Alcohol use: Yes    Alcohol/week: 0.0 standard drinks of alcohol   Drug use: No     Allergies   Patient has no known allergies.   Review of Systems Review of Systems  Constitutional:  Negative for fever.  HENT:  Positive for congestion, rhinorrhea and sore throat. Negative for ear pain.   Respiratory:  Negative for cough, shortness of breath and wheezing.   Gastrointestinal:  Negative for diarrhea, nausea and vomiting.  Musculoskeletal:  Positive for arthralgias and myalgias.  Skin:  Negative for rash.  Hematological: Negative.   Psychiatric/Behavioral: Negative.       Physical Exam Triage Vital Signs ED Triage Vitals [10/27/21 0948]  Enc Vitals Group     BP      Pulse      Resp      Temp      Temp src      SpO2      Weight 235 lb (106.6 kg)     Height 6' (1.829 m)     Head Circumference      Peak Flow       Pain Score 8     Pain Loc      Pain Edu?      Excl. in [place]?    No data found.  Updated Vital Signs BP (!) 136/106 (BP Location: Left Arm)   Pulse 99   Temp 99 F (37.2 C) (Oral)   Resp 18   Ht 6' (1.829 m)   Wt 235 lb (106.6 kg)   SpO2 98%   BMI 31.87 kg/m   Visual Acuity Right Eye Distance:   Left Eye Distance:   Bilateral Distance:    Right Eye Near:   Left Eye Near:    Bilateral Near:     Physical Exam Vitals and nursing note reviewed.  Constitutional:      Appearance: Normal appearance. He is not ill-appearing.  HENT:     Head: Normocephalic and atraumatic.     Right Ear: Tympanic membrane, ear canal and external ear normal. There is no impacted cerumen.     Left Ear: Tympanic membrane, ear canal and external ear normal. There is no impacted cerumen.     Nose: Congestion and rhinorrhea present.     Mouth/Throat:     Mouth: Mucous membranes are moist.     Pharynx: Oropharynx is clear. Posterior oropharyngeal erythema present. No oropharyngeal exudate.  Cardiovascular:     Rate and Rhythm: Normal rate and regular rhythm.     Pulses: Normal pulses.     Heart sounds: Normal heart sounds. No murmur heard.    No friction rub. No gallop.  Pulmonary:     Effort: Pulmonary effort is normal.     Breath sounds: Normal breath sounds. No wheezing, rhonchi or rales.  Musculoskeletal:     Cervical back: Normal range of motion and neck supple.  Lymphadenopathy:     Cervical: No cervical adenopathy.  Skin:    General: Skin is warm and dry.     Capillary Refill: Capillary refill takes less than 2 seconds.     Findings: No erythema or rash.  Neurological:     General: No focal deficit present.     Mental Status: He is alert and oriented to person, place, and time.  Psychiatric:        Mood and Affect: Mood normal.        Behavior: Behavior normal.        Thought Content: Thought content normal.        Judgment: Judgment normal.      UC Treatments / Results   Labs (all labs ordered are listed, but only abnormal results are displayed) Labs Reviewed - No data  to display  EKG   Radiology No results found.  Procedures Procedures (including critical care time)  Medications Ordered in UC Medications - No data to display  Initial Impression / Assessment and Plan / UC Course  I have reviewed the triage vital signs and the nursing notes.  Pertinent labs & imaging results that were available during my care of the patient were reviewed by me and considered in my medical decision making (see chart for details).  Patient is a pleasant, nontoxic-appearing 61 year old male here for evaluation of respiratory symptoms as outlined in HPI above.  His physical exam reveals pearly gray tympanic membranes bilaterally with normal light reflex and clear external auditory canals.  Nasal mucosa is erythematous and edematous with clear discharge in both nares.  Oropharyngeal exam reveals posterior oropharyngeal erythema and injection with clear postnasal drip.  No cervical adenopathy appreciable exam.  Cardiopulmonary exam reveals S1-S2 heart sounds with regular rate and rhythm and lung sounds that are clear to auscultation all fields.  Patient's exam is consistent with a viral upper respiratory infection.  Given his recent exposure to [place] I will treat him for COVID with molnupiravir twice daily for 5 days.  I have also prescribed Atrovent nasal spray to help with nasal congestion.  Tylenol and ibuprofen as needed for fever and body aches.  I discussed ER precautions with the patient and he verbalized an understanding of same.  Work note provided for the full quarantine duration.   Final Clinical Impressions(s) / UC Diagnoses   Final diagnoses:  Viral upper respiratory tract infection     Discharge Instructions      You will have to quarantine for 5 days from the start of your symptoms.  After 5 days you can break quarantine if your symptoms have improved and  you have not had a fever for 24 hours without taking Tylenol or ibuprofen.  Use over-the-counter Tylenol and ibuprofen as needed for body aches and fever.  Take molnupiravir twice daily for 5 days for treatment of COVID-19.  Use the Atrovent nasal spray as needed for congestion, 2 squirts in each nostril every 6 hours..  If you develop any increased shortness of breath-especially at rest, you are unable to speak in full sentences, or is a late sign your lips are turning blue you need to go the ER for evaluation.      ED Prescriptions     Medication Sig Dispense Auth. Provider   ipratropium (ATROVENT) 0.06 % nasal spray Place 2 sprays into both nostrils 4 (four) times daily. 15 mL Margarette Canada, NP   molnupiravir EUA (LAGEVRIO) 200 mg CAPS capsule Take 4 capsules (800 mg total) by mouth 2 (two) times daily for 5 days. 40 capsule Margarette Canada, NP      PDMP not reviewed this encounter.   Margarette Canada, NP 10/27/21 1003

## 2021-11-05 ENCOUNTER — Encounter: Payer: Self-pay | Admitting: Internal Medicine

## 2021-11-05 ENCOUNTER — Ambulatory Visit (INDEPENDENT_AMBULATORY_CARE_PROVIDER_SITE_OTHER): Payer: Medicare Other | Admitting: Internal Medicine

## 2021-11-05 VITALS — BP 136/86 | HR 52 | Ht 72.0 in | Wt 238.0 lb

## 2021-11-05 DIAGNOSIS — F3341 Major depressive disorder, recurrent, in partial remission: Secondary | ICD-10-CM | POA: Diagnosis not present

## 2021-11-05 DIAGNOSIS — Z01818 Encounter for other preprocedural examination: Secondary | ICD-10-CM | POA: Diagnosis not present

## 2021-11-05 DIAGNOSIS — R351 Nocturia: Secondary | ICD-10-CM

## 2021-11-05 DIAGNOSIS — M1611 Unilateral primary osteoarthritis, right hip: Secondary | ICD-10-CM

## 2021-11-05 DIAGNOSIS — E1169 Type 2 diabetes mellitus with other specified complication: Secondary | ICD-10-CM | POA: Diagnosis not present

## 2021-11-05 DIAGNOSIS — E118 Type 2 diabetes mellitus with unspecified complications: Secondary | ICD-10-CM | POA: Diagnosis not present

## 2021-11-05 DIAGNOSIS — M5412 Radiculopathy, cervical region: Secondary | ICD-10-CM

## 2021-11-05 DIAGNOSIS — E785 Hyperlipidemia, unspecified: Secondary | ICD-10-CM

## 2021-11-05 DIAGNOSIS — N401 Enlarged prostate with lower urinary tract symptoms: Secondary | ICD-10-CM

## 2021-11-05 MED ORDER — GABAPENTIN 400 MG PO CAPS: 400.0000 mg | ORAL_CAPSULE | Freq: Three times a day (TID) | ORAL | 1 refills | Status: DC

## 2021-11-05 NOTE — Progress Notes (Signed)
Date:  11/05/2021   Name:  Anthony Cohen   DOB:  04-Sep-1960   MRN:  [id number]   Chief Complaint: Annual Exam and Pre-op Exam Anthony Cohen is a 61 y.o. male who presents today for his Complete Annual Exam. He feels well. He reports exercising- none. He reports he is sleeping well.   Colonoscopy: 09/2013 repeat 10 yrs  Immunization History  Administered Date(s) Administered   PFIZER Comirnaty(Gray Top)Covid-19 Tri-Sucrose Vaccine 05/21/2019, 06/10/2019, 02/20/2020   Health Maintenance Due  Topic Date Due   OPHTHALMOLOGY EXAM  Never done   TETANUS/TDAP  Never done   Zoster Vaccines- Shingrix (1 of 2) Never done   COVID-19 Vaccine (4 - Pfizer series) 04/16/2020   INFLUENZA VACCINE  10/20/2021   HEMOGLOBIN A1C  11/01/2021    Lab Results  Component Value Date   PSA1 0.2 04/20/2016   PSA 0.9 07/19/2013    Hyperlipidemia This is a chronic problem. The problem is uncontrolled. Recent lipid tests were reviewed and are high. Pertinent negatives include no chest pain, myalgias or shortness of breath. Current antihyperlipidemic treatment includes ezetimibe.  Depression        This is a chronic problem.  The problem has been gradually worsening since onset.  Associated symptoms include decreased interest.  Associated symptoms include no fatigue, no appetite change, no myalgias and no headaches.     Exacerbated by: possibly worsened by chronic hip pain and anxiety over upcoming surgery.  Past treatments include other medications and SNRIs - Serotonin and norepinephrine reuptake inhibitors.  Compliance with treatment is good (followed by Psych previously). Hip Pain  The pain is present in the right hip. The pain is severe. The pain has been Constant (surgical planned) since onset.  Diabetes He presents for his follow-up diabetic visit. He has type 2 diabetes mellitus. His disease course has been stable. Pertinent negatives for hypoglycemia include no dizziness, headaches or  nervousness/anxiousness. Pertinent negatives for diabetes include no chest pain and no fatigue. Current diabetic treatment includes diet.    Lab Results  Component Value Date   NA 141 05/04/2021   K 4.5 05/04/2021   CO2 23 05/04/2021   GLUCOSE 117 (H) 05/04/2021   BUN 20 05/04/2021   CREATININE 0.69 (L) 05/04/2021   CALCIUM 9.0 05/04/2021   EGFR 106 05/04/2021   GFRNONAA >60 09/21/2019   Lab Results  Component Value Date   CHOL 226 (H) 05/04/2021   HDL 55 05/04/2021   LDLCALC 115 (H) 05/04/2021   TRIG 324 (H) 05/04/2021   CHOLHDL 4.1 05/04/2021   Lab Results  Component Value Date   TSH 2.110 05/04/2021   Lab Results  Component Value Date   HGBA1C 5.7 (H) 05/04/2021   Lab Results  Component Value Date   WBC 4.8 09/15/2020   HGB 14.4 09/15/2020   HCT 43.5 09/15/2020   MCV 95 09/15/2020   PLT 187 09/15/2020   Lab Results  Component Value Date   ALT 31 05/04/2021   AST 30 05/04/2021   ALKPHOS 71 05/04/2021   BILITOT 0.3 05/04/2021   No results found for: "25OHVITD2", "25OHVITD3", "VD25OH"   Review of Systems  Constitutional:  Negative for appetite change, chills, diaphoresis, fatigue and unexpected weight change.  HENT:  Negative for hearing loss, tinnitus, trouble swallowing and voice change.   Eyes:  Negative for visual disturbance.  Respiratory:  Negative for choking, shortness of breath and wheezing.   Cardiovascular:  Negative for chest pain, palpitations and leg  swelling.  Gastrointestinal:  Negative for abdominal pain, blood in stool, constipation and diarrhea.  Genitourinary:  Positive for frequency (nocturia). Negative for difficulty urinating and dysuria.  Musculoskeletal:  Positive for arthralgias and gait problem. Negative for back pain and myalgias.  Skin:  Negative for color change and rash.  Neurological:  Negative for dizziness, syncope and headaches.  Hematological:  Negative for adenopathy.  Psychiatric/Behavioral:  Positive for depression.  Negative for dysphoric mood and sleep disturbance. The patient is not nervous/anxious.     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   Erectile dysfunction 06/09/2020   Pinched nerve in shoulder, left 09/11/2019   Pes planus, congenital 05/26/2018   Restless leg syndrome 05/26/2018   Arthritis of knee 10/25/2016   GERD without esophagitis 04/20/2016   Type II diabetes mellitus with complication ([place]) 88/50/2774   Hyperlipidemia associated with type 2 diabetes mellitus ([place]) 06/16/2015   OSA (obstructive sleep apnea) 03/10/2015   Recurrent major depressive disorder, in partial remission ([place]) 02/19/2015   Family history of digestive disorder 02/19/2015   Tobacco use disorder, mild, in sustained remission 02/19/2015    No Known Allergies  Past Surgical History:  Procedure Laterality Date   NO PAST SURGERIES      Social History   Tobacco Use   Smoking status: Former    Packs/day: 1.00    Years: 3.00    Total pack years: 3.00    Types: Cigarettes    Start date: 03/28/2015    Quit date: 03/29/2015    Years since quitting: 6.6   Smokeless tobacco: Never  Vaping Use   Vaping Use: Never used  Substance Use Topics   Alcohol use: Yes    Alcohol/week: 0.0 standard drinks of alcohol   Drug use: No     Medication list has been reviewed and updated.  Current Meds  Medication Sig   buPROPion (WELLBUTRIN XL) 150 MG 24 hr tablet TAKE THREE TABLETS BY MOUTH DAILY   ezetimibe (ZETIA) 10 MG tablet Take 1 tablet (10 mg total) by mouth daily.   ipratropium (ATROVENT) 0.06 % nasal spray Place 2 sprays into both nostrils 4 (four) times daily.   sildenafil (REVATIO) 20 MG tablet TAKE 1-5 TABLETS BY MOUTH DAILY   venlafaxine XR (EFFEXOR-XR) 75 MG 24 hr capsule TAKE THREE CAPSULES BY MOUTH EVERY MORNING WITH BREAKFAST   [DISCONTINUED] gabapentin  (NEURONTIN) 400 MG capsule Take 1 capsule (400 mg total) by mouth 3 (three) times daily. (Patient taking differently: Take 400 mg by mouth 2 (two) times daily.)       11/05/2021   10:30 AM 05/04/2021    9:00 AM 12/31/2020    8:53 AM 09/15/2020   10:26 AM  GAD 7 : Generalized Anxiety Score  Nervous, Anxious, on Edge 1 0 1 0  Control/stop worrying 1 0 0 0  Worry too much - different things 1 0 0 0  Trouble relaxing 1 0 0 0  Restless 0 0 0 0  Easily annoyed or irritable 2 0 1 0  Afraid - awful might happen 0 0 0 0  Total GAD 7 Score 6 0 2 0  Anxiety Difficulty Somewhat difficult Not difficult at all Not difficult at all Not difficult at all       11/05/2021   10:30 AM 05/04/2021    9:00 AM 12/31/2020  8:53 AM  Depression screen PHQ 2/9  Decreased Interest 0 0 0  Down, Depressed, Hopeless 0 0 0  PHQ - 2 Score 0 0 0  Altered sleeping 0 0 0  Tired, decreased energy 0 0 0  Change in appetite 0 0 0  Feeling bad or failure about yourself  0 0 0  Trouble concentrating 0 0 1  Moving slowly or fidgety/restless 0 0 0  Suicidal thoughts 0 0 0  PHQ-9 Score 0 0 1  Difficult doing work/chores Not difficult at all Not difficult at all Not difficult at all    BP Readings from Last 3 Encounters:  11/05/21 136/86  10/27/21 (!) 136/106  05/11/21 (!) 142/97    Physical Exam Vitals and nursing note reviewed.  Constitutional:      Appearance: Normal appearance. He is well-developed.  HENT:     Head: Normocephalic.     Right Ear: Tympanic membrane, ear canal and external ear normal.     Left Ear: Tympanic membrane, ear canal and external ear normal.     Nose: Nose normal.  Eyes:     Conjunctiva/sclera: Conjunctivae normal.     Pupils: Pupils are equal, round, and reactive to light.  Neck:     Thyroid: No thyromegaly.     Vascular: No carotid bruit.  Cardiovascular:     Rate and Rhythm: Normal rate and regular rhythm.     Heart sounds: Normal heart sounds.  Pulmonary:     Effort:  Pulmonary effort is normal.     Breath sounds: Normal breath sounds. No wheezing.  Chest:  Breasts:    Right: No mass.     Left: No mass.  Abdominal:     General: Bowel sounds are normal.     Palpations: Abdomen is soft.     Tenderness: There is no abdominal tenderness.  Musculoskeletal:     Cervical back: Normal range of motion and neck supple.     Right hip: Decreased range of motion.     Left hip: Normal range of motion.     Right foot: Deformity present.     Left foot: Deformity present.  Lymphadenopathy:     Cervical: No cervical adenopathy.  Skin:    General: Skin is warm and dry.  Neurological:     General: No focal deficit present.     Mental Status: He is alert and oriented to person, place, and time.     Deep Tendon Reflexes: Reflexes are normal and symmetric.  Psychiatric:        Attention and Perception: Attention normal.        Mood and Affect: Mood normal.        Thought Content: Thought content normal.     Wt Readings from Last 3 Encounters:  11/05/21 238 lb (108 kg)  10/27/21 235 lb (106.6 kg)  05/11/21 231 lb (104.8 kg)    BP 136/86 (BP Location: Left Arm, Cuff Size: Normal)   Pulse (!) 52   Ht 6' (1.829 m)   Wt 238 lb (108 kg)   SpO2 96%   BMI 32.28 kg/m   Assessment and Plan: 1. Type II diabetes mellitus with complication (HCC) BS controlled with diet alone - Hemoglobin A1c - Comprehensive metabolic panel  2. Hyperlipidemia associated with type 2 diabetes mellitus (HCC) Intolerant of statins; on Zetia - Lipid panel - TSH  3. Primary osteoarthritis of right hip Surgery planned Pre-op clearance signed  4. Recurrent major depressive disorder, in partial remission (  [place]) Some recent worsening of motivation likely related to pain Recommend continuing current regimen and reassess in several months Sooner if markedly worse  5. Preop examination Normal exam other than weight. Will forward EKG and labs to Orthopedics - EKG 12-Lead;  SR with  borderline tachycardia, occ PAC, no acute abnormality - CBC with Differential/Platelet  6. Cervical radiculopathy Take gabapentin for hand cramping with improvement - gabapentin (NEURONTIN) 400 MG capsule; Take 1 capsule (400 mg total) by mouth 3 (three) times daily.  Dispense: 270 capsule; Refill: 1  7. Benign prostatic hyperplasia with nocturia Not currently requiring treatment - will consider trial of Flomax if sx worsen - PSA   Partially dictated using Dragon software. Any errors are unintentional.  Halina Maidens, MD [facility]  11/05/2021

## 2021-11-06 LAB — COMPREHENSIVE METABOLIC PANEL
ALT: 46 IU/L — ABNORMAL HIGH (ref 0–44)
AST: 35 IU/L (ref 0–40)
Albumin/Globulin Ratio: 1.8 (ref 1.2–2.2)
Albumin: 4.6 g/dL (ref 3.9–4.9)
Alkaline Phosphatase: 76 IU/L (ref 44–121)
BUN/Creatinine Ratio: 16 (ref 10–24)
BUN: 15 mg/dL (ref 8–27)
Bilirubin Total: 0.4 mg/dL (ref 0.0–1.2)
CO2: 26 mmol/L (ref 20–29)
Calcium: 9.6 mg/dL (ref 8.6–10.2)
Chloride: 102 mmol/L (ref 96–106)
Creatinine, Ser: 0.92 mg/dL (ref 0.76–1.27)
Globulin, Total: 2.6 g/dL (ref 1.5–4.5)
Glucose: 131 mg/dL — ABNORMAL HIGH (ref 70–99)
Potassium: 4.5 mmol/L (ref 3.5–5.2)
Sodium: 142 mmol/L (ref 134–144)
Total Protein: 7.2 g/dL (ref 6.0–8.5)
eGFR: 95 mL/min/{1.73_m2} (ref >59)

## 2021-11-06 LAB — CBC WITH DIFFERENTIAL/PLATELET
Basophils Absolute: 0.1 10*3/uL (ref 0.0–0.2)
Basos: 1 %
EOS (ABSOLUTE): 0.2 10*3/uL (ref 0.0–0.4)
Eos: 3 %
Hematocrit: 42.9 % (ref 37.5–51.0)
Hemoglobin: 14.2 g/dL (ref 13.0–17.7)
Immature Grans (Abs): 0 10*3/uL (ref 0.0–0.1)
Immature Granulocytes: 0 %
Lymphocytes Absolute: 1.8 10*3/uL (ref 0.7–3.1)
Lymphs: 34 %
MCH: 30.7 pg (ref 26.6–33.0)
MCHC: 33.1 g/dL (ref 31.5–35.7)
MCV: 93 fL (ref 79–97)
Monocytes Absolute: 0.5 10*3/uL (ref 0.1–0.9)
Monocytes: 10 %
Neutrophils Absolute: 2.7 10*3/uL (ref 1.4–7.0)
Neutrophils: 52 %
Platelets: 242 10*3/uL (ref 150–450)
RBC: 4.63 x10E6/uL (ref 4.14–5.80)
RDW: 12.3 % (ref 11.6–15.4)
WBC: 5.2 10*3/uL (ref 3.4–10.8)

## 2021-11-06 LAB — HEMOGLOBIN A1C
Est. average glucose Bld gHb Est-mCnc: 120 mg/dL
Hgb A1c MFr Bld: 5.8 % — ABNORMAL HIGH (ref 4.8–5.6)

## 2021-11-06 LAB — LIPID PANEL
Chol/HDL Ratio: 3.3 ratio (ref 0.0–5.0)
Cholesterol, Total: 221 mg/dL — ABNORMAL HIGH (ref 100–199)
HDL: 68 mg/dL (ref >39)
LDL Chol Calc (NIH): 110 mg/dL — ABNORMAL HIGH (ref 0–99)
Triglycerides: 254 mg/dL — ABNORMAL HIGH (ref 0–149)
VLDL Cholesterol Cal: 43 mg/dL — ABNORMAL HIGH (ref 5–40)

## 2021-11-06 LAB — TSH: TSH: 1.81 u[IU]/mL (ref 0.450–4.500)

## 2021-11-06 LAB — PSA: Prostate Specific Ag, Serum: 0.5 ng/mL (ref 0.0–4.0)

## 2021-11-19 ENCOUNTER — Other Ambulatory Visit: Payer: Self-pay | Admitting: Internal Medicine

## 2021-11-19 DIAGNOSIS — F3341 Major depressive disorder, recurrent, in partial remission: Secondary | ICD-10-CM

## 2021-11-20 HISTORY — PX: HIP ARTHROPLASTY: SHX981

## 2021-11-20 NOTE — Telephone Encounter (Signed)
Requested Prescriptions  Pending Prescriptions Disp Refills  . venlafaxine XR (EFFEXOR-XR) 75 MG 24 hr capsule [Pharmacy Med Name: VENLAFAXINE HCL ER 75 MG CAP] 270 capsule 0    Sig: TAKE THREE CAPSULES BY MOUTH EVERY MORNING WITH BREAKFAST     Psychiatry: Antidepressants - SNRI - desvenlafaxine & venlafaxine Failed - 11/19/2021  6:22 AM      Failed - Lipid Panel in normal range within the last 12 months    Cholesterol, Total  Date Value Ref Range Status  11/05/2021 221 (H) 100 - 199 mg/dL Final   LDL Chol Calc (NIH)  Date Value Ref Range Status  11/05/2021 110 (H) 0 - 99 mg/dL Final   HDL  Date Value Ref Range Status  11/05/2021 68 >39 mg/dL Final   Triglycerides  Date Value Ref Range Status  11/05/2021 254 (H) 0 - 149 mg/dL Final         Passed - Cr in normal range and within 360 days    Creatinine, Ser  Date Value Ref Range Status  11/05/2021 0.92 0.76 - 1.27 mg/dL Final         Passed - Completed PHQ-2 or PHQ-9 in the last 360 days      Passed - Last BP in normal range    BP Readings from Last 1 Encounters:  11/05/21 136/86         Passed - Valid encounter within last 6 months    Recent Outpatient Visits          2 weeks ago Type II diabetes mellitus with complication ([place])   [place] [facility] at [facility], Jesse Sans, MD   6 months ago Type II diabetes mellitus with complication [facility])   [place] [facility] at [facility], Jesse Sans, MD   10 months ago Type II diabetes mellitus with complication [facility])   [place] [facility] at [facility], Jesse Sans, MD   1 year ago Type II diabetes mellitus with complication [facility])   [place] [facility] at [facility], Jesse Sans, MD   1 year ago Restless leg syndrome   [place] [facility] at [facility], Jesse Sans, MD      Future Appointments             In 3 months Army Melia, Jesse Sans, MD [facility] at [facility], [facility]

## 2021-11-22 ENCOUNTER — Encounter: Payer: Self-pay | Admitting: Emergency Medicine

## 2021-11-22 ENCOUNTER — Other Ambulatory Visit: Payer: Self-pay

## 2021-11-22 ENCOUNTER — Ambulatory Visit
Admission: EM | Admit: 2021-11-22 | Discharge: 2021-11-22 | Disposition: A | Discharge status: Home / Self Care | Payer: Medicare Other | Attending: Emergency Medicine | Admitting: Emergency Medicine

## 2021-11-22 DIAGNOSIS — J0100 Acute maxillary sinusitis, unspecified: Secondary | ICD-10-CM | POA: Diagnosis not present

## 2021-11-22 MED ORDER — AMOXICILLIN-POT CLAVULANATE 875-125 MG PO TABS: 1.0000 | ORAL_TABLET | Freq: Two times a day (BID) | ORAL | 0 refills | Status: AC

## 2021-11-22 NOTE — ED Provider Notes (Signed)
[facility]    CSN: [id number] Arrival date & time: 11/22/21  1202      History   Chief Complaint Chief Complaint  Patient presents with   Nasal Congestion    HPI Anthony Cohen is a 61 y.o. male.   HPI  61 year old male here for evaluation of upper respiratory complaints.  Patient reports that he has been experiencing nasal congestion with sinus pain and pressure and postnasal drip since he was diagnosed with COVID back on 10/27/2021.  He states that his mucus has turned from what clear to a deep yellow today.  He denies any fever, ear pain, or cough.  He is just concerned because he is due to have a hip replacement next week.  History reviewed. No pertinent past medical history.  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   Erectile dysfunction 06/09/2020   Pinched nerve in shoulder, left 09/11/2019   Pes planus, congenital 05/26/2018   Restless leg syndrome 05/26/2018   Arthritis of knee 10/25/2016   GERD without esophagitis 04/20/2016   Type II diabetes mellitus with complication ([place]) 51/70/0174   Hyperlipidemia associated with type 2 diabetes mellitus ([place]) 06/16/2015   OSA (obstructive sleep apnea) 03/10/2015   Recurrent major depressive disorder, in partial remission ([place]) 02/19/2015   Family history of digestive disorder 02/19/2015   Tobacco use disorder, mild, in sustained remission 02/19/2015    Past Surgical History:  Procedure Laterality Date   NO PAST SURGERIES         Home Medications    Prior to Admission medications   Medication Sig Start Date End Date Taking? Authorizing Provider  amoxicillin-clavulanate (AUGMENTIN) 875-125 MG tablet Take 1 tablet by mouth every 12 (twelve) hours for 10 days. 11/22/21 12/02/21 Yes Margarette Canada, NP  buPROPion (WELLBUTRIN XL) 150 MG 24 hr tablet TAKE THREE TABLETS BY  MOUTH DAILY 10/13/21  Yes Glean Hess, MD  ezetimibe (ZETIA) 10 MG tablet Take 1 tablet (10 mg total) by mouth daily. 12/31/20  Yes Glean Hess, MD  gabapentin (NEURONTIN) 400 MG capsule Take 1 capsule (400 mg total) by mouth 3 (three) times daily. 11/05/21  Yes Glean Hess, MD  sildenafil (REVATIO) 20 MG tablet TAKE 1-5 TABLETS BY MOUTH DAILY 12/31/20  Yes Glean Hess, MD  venlafaxine XR (EFFEXOR-XR) 75 MG 24 hr capsule TAKE THREE CAPSULES BY MOUTH EVERY MORNING WITH BREAKFAST 11/20/21  Yes Glean Hess, MD  ipratropium (ATROVENT) 0.06 % nasal spray Place 2 sprays into both nostrils 4 (four) times daily. 10/27/21   Margarette Canada, NP    Family History Family History  Problem Relation Age of Onset   Hypertension Mother    Breast cancer Mother     Social History Social History   Tobacco Use   Smoking status: Former    Packs/day: 1.00    Years: 3.00    Total pack years: 3.00    Types: Cigarettes    Start date: 03/28/2015    Quit date: 03/29/2015    Years since quitting: 6.6   Smokeless tobacco: Never  Vaping Use   Vaping Use: Never used  Substance Use Topics   Alcohol use: Yes    Alcohol/week: 0.0 standard drinks of alcohol   Drug use: No     Allergies   Patient has no known allergies.   Review of  Systems Review of Systems  Constitutional:  Negative for fever.  HENT:  Positive for congestion, postnasal drip, rhinorrhea, sinus pressure and sinus pain. Negative for ear pain and sore throat.   Respiratory:  Negative for cough.      Physical Exam Triage Vital Signs ED Triage Vitals  Enc Vitals Group     BP 11/22/21 1228 (!) 147/110     Pulse Rate 11/22/21 1228 98     Resp 11/22/21 1228 16     Temp 11/22/21 1228 98.4 F (36.9 C)     Temp Source 11/22/21 1228 Oral     SpO2 11/22/21 1228 98 %     Weight 11/22/21 1226 238 lb 1.6 oz (108 kg)     Height 11/22/21 1226 6' (1.829 m)     Head Circumference --      Peak Flow --      Pain Score 11/22/21  1226 0     Pain Loc --      Pain Edu? --      Excl. in [place]? --    No data found.  Updated Vital Signs BP (!) 147/110 (BP Location: Left Arm)   Pulse 98   Temp 98.4 F (36.9 C) (Oral)   Resp 16   Ht 6' (1.829 m)   Wt 238 lb 1.6 oz (108 kg)   SpO2 98%   BMI 32.29 kg/m   Visual Acuity Right Eye Distance:   Left Eye Distance:   Bilateral Distance:    Right Eye Near:   Left Eye Near:    Bilateral Near:     Physical Exam Vitals and nursing note reviewed.  Constitutional:      Appearance: Normal appearance. He is not ill-appearing.  HENT:     Head: Normocephalic and atraumatic.     Right Ear: Tympanic membrane, ear canal and external ear normal. There is no impacted cerumen.     Left Ear: Tympanic membrane, ear canal and external ear normal. There is no impacted cerumen.     Nose: Congestion and rhinorrhea present.     Mouth/Throat:     Mouth: Mucous membranes are moist.     Pharynx: Oropharynx is clear. No oropharyngeal exudate or posterior oropharyngeal erythema.  Cardiovascular:     Rate and Rhythm: Normal rate and regular rhythm.     Pulses: Normal pulses.     Heart sounds: Normal heart sounds. No murmur heard.    No friction rub. No gallop.  Pulmonary:     Effort: Pulmonary effort is normal.     Breath sounds: Normal breath sounds. No wheezing, rhonchi or rales.  Musculoskeletal:     Cervical back: Normal range of motion and neck supple.  Lymphadenopathy:     Cervical: No cervical adenopathy.  Skin:    General: Skin is warm and dry.     Capillary Refill: Capillary refill takes less than 2 seconds.     Findings: No erythema or rash.  Neurological:     General: No focal deficit present.     Mental Status: He is alert and oriented to person, place, and time.  Psychiatric:        Mood and Affect: Mood normal.        Behavior: Behavior normal.        Thought Content: Thought content normal.        Judgment: Judgment normal.      UC Treatments / Results   Labs (all labs ordered are listed, but only abnormal  results are displayed) Labs Reviewed - No data to display  EKG   Radiology No results found.  Procedures Procedures (including critical care time)  Medications Ordered in UC Medications - No data to display  Initial Impression / Assessment and Plan / UC Course  I have reviewed the triage vital signs and the nursing notes.  Pertinent labs & imaging results that were available during my care of the patient were reviewed by me and considered in my medical decision making (see chart for details).   Patient is a pleasant, nontoxic-appearing 61 year old male here for evaluation of 4 weeks worth of nasal congestion, postnasal drip, and sinus pain and pressure that developed when he had COVID.  He denies any fever but he states that he is mucus have started to change color to dark yellow beginning yesterday.  He is due to have a total hip replacement in 4 days and he came in for evaluation because he is concerned.  His physical exam reveals pearly-gray tympanic membranes bilaterally with normal light reflex and clear external auditory canals.  Nasal mucosa is erythematous and edematous with clear discharge in both nares.  Patient does have tenderness to percussion of bilateral maxillary sinuses.  Oropharyngeal exam is benign.  No cervical lymphadenopathy present on exam.  Cardiopulmonary exam reveals S1-S2 heart sounds with regular rate and rhythm lung sounds are clear to auscultation all fields.  Patient Damas consistent with acute maxillary sinusitis.  Given the duration of symptoms I will start him on Augmentin twice daily for 10 days.  I have also advised him to touch base with his orthopedist to see if he needs to delay his joint replacement or not.  I also encouraged him to continue performing sinus irrigation.   Final Clinical Impressions(s) / UC Diagnoses   Final diagnoses:  Acute non-recurrent maxillary sinusitis     Discharge  Instructions      The Augmentin twice daily with food for 10 days for treatment of your sinusitis.  Perform sinus irrigation 2-3 times a day with a NeilMed sinus rinse kit and distilled water.  Do not use tap water.  You can use plain over-the-counter Mucinex every 6 hours to break up the stickiness of the mucus so your body can clear it.  Increase your oral fluid intake to thin out your mucus so that is also able for your body to clear more easily.  Take an over-the-counter probiotic, such as Culturelle-align-activia, 1 hour after each dose of antibiotic to prevent diarrhea.  If you develop any new or worsening symptoms return for reevaluation or see your primary care provider.   Touch base with your orthopedist about your sinus infection and being on antibiotics.      ED Prescriptions     Medication Sig Dispense Auth. Provider   amoxicillin-clavulanate (AUGMENTIN) 875-125 MG tablet Take 1 tablet by mouth every 12 (twelve) hours for 10 days. 20 tablet Margarette Canada, NP      PDMP not reviewed this encounter.   Margarette Canada, NP 11/22/21 1313

## 2021-11-22 NOTE — Discharge Instructions (Signed)
The Augmentin twice daily with food for 10 days for treatment of your sinusitis.  Perform sinus irrigation 2-3 times a day with a NeilMed sinus rinse kit and distilled water.  Do not use tap water.  You can use plain over-the-counter Mucinex every 6 hours to break up the stickiness of the mucus so your body can clear it.  Increase your oral fluid intake to thin out your mucus so that is also able for your body to clear more easily.  Take an over-the-counter probiotic, such as Culturelle-align-activia, 1 hour after each dose of antibiotic to prevent diarrhea.  If you develop any new or worsening symptoms return for reevaluation or see your primary care provider.   Touch base with your orthopedist about your sinus infection and being on antibiotics.

## 2021-11-22 NOTE — ED Triage Notes (Addendum)
Pt c/o nasal congestion, post nasal drip, sinus pain/pressure. He states this has been going on since he had covid back in august. He states he is having a hip replacement next week. He states his mucus started turning colors today.

## 2021-12-07 ENCOUNTER — Telehealth: Payer: Self-pay | Admitting: Internal Medicine

## 2021-12-07 NOTE — Telephone Encounter (Signed)
Copied from [place] [id number]. Topic: Medicare AWV >> Dec 07, 2021 10:45 AM Jae Dire wrote: Reason for CRM:  Phone picks up and hangs up  unable to leave a message for patient to call back and schedule Medicare Annual Wellness Visit (AWV) in office.   If unable to come into the office for AWV,  please offer to do virtually or by telephone.  No hx of AWV eligible for AWVI per [facility] as of 05/20/2021  Please schedule at anytime with [facility] Health Advisor.      45 minute appointment   Any questions, please call me at [phone]

## 2022-01-08 ENCOUNTER — Other Ambulatory Visit: Payer: Self-pay | Admitting: Internal Medicine

## 2022-01-08 DIAGNOSIS — F3341 Major depressive disorder, recurrent, in partial remission: Secondary | ICD-10-CM

## 2022-01-08 NOTE — Telephone Encounter (Signed)
Requested Prescriptions  Pending Prescriptions Disp Refills  . buPROPion (WELLBUTRIN XL) 150 MG 24 hr tablet [Pharmacy Med Name: buPROPion HCL XL 150 MG TABLET] 270 tablet 0    Sig: TAKE THREE TABLETS BY MOUTH DAILY     Psychiatry: Antidepressants - bupropion Failed - 01/08/2022  6:22 AM      Failed - ALT in normal range and within 360 days    ALT  Date Value Ref Range Status  11/05/2021 46 (H) 0 - 44 IU/L Final         Failed - Last BP in normal range    BP Readings from Last 1 Encounters:  11/22/21 (!) 147/110         Passed - Cr in normal range and within 360 days    Creatinine, Ser  Date Value Ref Range Status  11/05/2021 0.92 0.76 - 1.27 mg/dL Final         Passed - AST in normal range and within 360 days    AST  Date Value Ref Range Status  11/05/2021 35 0 - 40 IU/L Final         Passed - Completed PHQ-2 or PHQ-9 in the last 360 days      Passed - Valid encounter within last 6 months    Recent Outpatient Visits          2 months ago Type II diabetes mellitus with complication ([place])   [place] [facility], Jesse Sans, MD   8 months ago Type II diabetes mellitus with complication [facility])   [place] [facility] at [facility], Jesse Sans, MD   1 year ago Type II diabetes mellitus with complication [facility])   [place] [facility] at [facility], Jesse Sans, MD   1 year ago Type II diabetes mellitus with complication [facility])   [place] [facility] at [facility], Jesse Sans, MD   1 year ago Restless leg syndrome   [place] [facility] at [facility], Jesse Sans, MD      Future Appointments            In 1 month Army Melia, Jesse Sans, MD [place] [facility] at [facility], [facility]

## 2022-01-29 ENCOUNTER — Ambulatory Visit: Payer: Medicare Other

## 2022-02-05 ENCOUNTER — Telehealth: Payer: Self-pay | Admitting: Internal Medicine

## 2022-02-05 NOTE — Telephone Encounter (Signed)
Spoke with patient he stated his father passed and req a CB end of December 2023

## 2022-02-18 ENCOUNTER — Other Ambulatory Visit: Payer: Self-pay | Admitting: Internal Medicine

## 2022-02-18 DIAGNOSIS — F3341 Major depressive disorder, recurrent, in partial remission: Secondary | ICD-10-CM

## 2022-02-18 NOTE — Telephone Encounter (Signed)
Requested Prescriptions  Pending Prescriptions Disp Refills   venlafaxine XR (EFFEXOR-XR) 75 MG 24 hr capsule [Pharmacy Med Name: VENLAFAXINE HCL ER 75 MG CAP] 270 capsule 0    Sig: TAKE THREE CAPSULES BY MOUTH EVERY MORNING WITH BREAKFAST     Psychiatry: Antidepressants - SNRI - desvenlafaxine & venlafaxine Failed - 02/18/2022  6:22 AM      Failed - Last BP in normal range    BP Readings from Last 1 Encounters:  11/22/21 (!) 147/110         Failed - Lipid Panel in normal range within the last 12 months    Cholesterol, Total  Date Value Ref Range Status  11/05/2021 221 (H) 100 - 199 mg/dL Final   LDL Chol Calc (NIH)  Date Value Ref Range Status  11/05/2021 110 (H) 0 - 99 mg/dL Final   HDL  Date Value Ref Range Status  11/05/2021 68 >39 mg/dL Final   Triglycerides  Date Value Ref Range Status  11/05/2021 254 (H) 0 - 149 mg/dL Final         Passed - Cr in normal range and within 360 days    Creatinine, Ser  Date Value Ref Range Status  11/05/2021 0.92 0.76 - 1.27 mg/dL Final         Passed - Completed PHQ-2 or PHQ-9 in the last 360 days      Passed - Valid encounter within last 6 months    Recent Outpatient Visits           3 months ago Type II diabetes mellitus with complication ([place])   [place] [facility] at [facility], Jesse Sans, MD   9 months ago Type II diabetes mellitus with complication [facility])   [place] [facility] at [facility], Jesse Sans, MD   1 year ago Type II diabetes mellitus with complication [facility])   [place] [facility] at [facility], Jesse Sans, MD   1 year ago Type II diabetes mellitus with complication [facility])   [place] [facility] at [facility], Jesse Sans, MD   1 year ago Restless leg syndrome   [place] [facility] at [facility], Jesse Sans, MD       Future  Appointments             In 2 weeks Army Melia, Jesse Sans, MD [facility] at [facility], [facility]

## 2022-02-24 ENCOUNTER — Telehealth: Payer: Self-pay | Admitting: Internal Medicine

## 2022-02-24 NOTE — Telephone Encounter (Signed)
Copied from [place] [phone]. Topic: Medicare AWV >> Feb 24, 2022 11:24 AM Devoria Glassing wrote: Reason for CRM: Left message tor patient to schedule Medicare Annual Wellness Visit (AWV) with [facility] Health Advisor.  Appointment can be an offiice/telephone or virtual visit;  Please call [phone] ask for [facility].

## 2022-02-25 ENCOUNTER — Telehealth: Payer: Self-pay | Admitting: Internal Medicine

## 2022-02-25 NOTE — Telephone Encounter (Signed)
Copied from [place] [phone]. Topic: Medicare AWV >> Feb 25, 2022  9:23 AM Devoria Glassing wrote: Reason for CRM: Left message tor patient to schedule Medicare Annual Wellness Visit (AWV) with [facility] Health Advisor.  Appointment can be an offiice/telephone or virtual visit;  Please call [phone] ask for [facility].

## 2022-03-08 ENCOUNTER — Ambulatory Visit (INDEPENDENT_AMBULATORY_CARE_PROVIDER_SITE_OTHER): Payer: Medicare Other | Admitting: Internal Medicine

## 2022-03-08 ENCOUNTER — Encounter: Payer: Self-pay | Admitting: Internal Medicine

## 2022-03-08 VITALS — BP 120/76 | HR 109 | Ht 72.0 in | Wt 264.0 lb

## 2022-03-08 DIAGNOSIS — E118 Type 2 diabetes mellitus with unspecified complications: Secondary | ICD-10-CM | POA: Diagnosis not present

## 2022-03-08 DIAGNOSIS — F3341 Major depressive disorder, recurrent, in partial remission: Secondary | ICD-10-CM

## 2022-03-08 DIAGNOSIS — M5412 Radiculopathy, cervical region: Secondary | ICD-10-CM | POA: Diagnosis not present

## 2022-03-08 DIAGNOSIS — E1169 Type 2 diabetes mellitus with other specified complication: Secondary | ICD-10-CM | POA: Diagnosis not present

## 2022-03-08 DIAGNOSIS — E785 Hyperlipidemia, unspecified: Secondary | ICD-10-CM

## 2022-03-08 DIAGNOSIS — Q6650 Congenital pes planus, unspecified foot: Secondary | ICD-10-CM

## 2022-03-08 MED ORDER — VENLAFAXINE HCL ER 75 MG PO CP24: 225.0000 mg | ORAL_CAPSULE | Freq: Every day | ORAL | 1 refills | Status: DC

## 2022-03-08 MED ORDER — BUPROPION HCL ER (XL) 150 MG PO TB24: 450.0000 mg | ORAL_TABLET | Freq: Every day | ORAL | 1 refills | Status: DC

## 2022-03-08 MED ORDER — GABAPENTIN 400 MG PO CAPS: 400.0000 mg | ORAL_CAPSULE | Freq: Two times a day (BID) | ORAL | 1 refills | Status: DC

## 2022-03-08 NOTE — Progress Notes (Signed)
Date:  03/08/2022   Name:  Anthony Cohen   DOB:  11-29-1960   MRN:  [id number]   Chief Complaint: Hypertension  Hyperlipidemia This is a chronic problem. The problem is controlled. Pertinent negatives include no chest pain or shortness of breath. Current antihyperlipidemic treatment includes ezetimibe. The current treatment provides moderate improvement of lipids.  Diabetes He presents for his follow-up diabetic visit. He has type 2 diabetes mellitus. His disease course has been stable. Pertinent negatives for hypoglycemia include no dizziness, headaches or nervousness/anxiousness. Pertinent negatives for diabetes include no chest pain and no fatigue. Current diabetic treatment includes diet. He is compliant with treatment most of the time (although he has gained back 25 lbs). His weight is increasing steadily. He is following a generally healthy diet. There is no compliance (has not been checking BS) with monitoring of blood glucose.  Depression        This is a chronic problem.  The problem has been resolved since onset.  Associated symptoms include no fatigue and no headaches.  Past treatments include SNRIs - Serotonin and norepinephrine reuptake inhibitors and other medications.  Compliance with treatment is good.  Previous treatment provided significant relief. Knee and ankle pain - he has received steroid injections in his knees with benefit.  However, his left ankle and heel are more painful.  The ortho has evaluated his ankle and ordered an MRI.  He has tried to wear a boot but it is too uncomfortable.  Lab Results  Component Value Date   NA 142 11/05/2021   K 4.5 11/05/2021   CO2 26 11/05/2021   GLUCOSE 131 (H) 11/05/2021   BUN 15 11/05/2021   CREATININE 0.92 11/05/2021   CALCIUM 9.6 11/05/2021   EGFR 95 11/05/2021   GFRNONAA >60 09/21/2019   Lab Results  Component Value Date   CHOL 221 (H) 11/05/2021   HDL 68 11/05/2021   LDLCALC 110 (H) 11/05/2021   TRIG 254 (H)  11/05/2021   CHOLHDL 3.3 11/05/2021   Lab Results  Component Value Date   TSH 1.810 11/05/2021   Lab Results  Component Value Date   HGBA1C 5.8 (H) 11/05/2021   Lab Results  Component Value Date   WBC 5.2 11/05/2021   HGB 14.2 11/05/2021   HCT 42.9 11/05/2021   MCV 93 11/05/2021   PLT 242 11/05/2021   Lab Results  Component Value Date   ALT 46 (H) 11/05/2021   AST 35 11/05/2021   ALKPHOS 76 11/05/2021   BILITOT 0.4 11/05/2021   No results found for: "25OHVITD2", "25OHVITD3", "VD25OH"   Review of Systems  Constitutional:  Positive for unexpected weight change. Negative for chills, fatigue and fever.  Respiratory:  Negative for chest tightness and shortness of breath.   Cardiovascular:  Negative for chest pain and leg swelling.  Musculoskeletal:  Positive for arthralgias, gait problem and joint swelling.  Skin:  Negative for wound.  Neurological:  Negative for dizziness and headaches.  Psychiatric/Behavioral:  Positive for depression. Negative for dysphoric mood and sleep disturbance. The patient is not nervous/anxious.     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   Erectile dysfunction 06/09/2020   Pinched nerve in shoulder, left 09/11/2019   Pes planus, congenital 05/26/2018   Restless leg syndrome 05/26/2018   Arthritis of knee 10/25/2016   GERD without esophagitis 04/20/2016  Type II diabetes mellitus with complication ([place]) 41/93/7902   Hyperlipidemia associated with type 2 diabetes mellitus ([place]) 06/16/2015   OSA (obstructive sleep apnea) 03/10/2015   Recurrent major depressive disorder, in partial remission ([place]) 02/19/2015   Family history of digestive disorder 02/19/2015   Tobacco use disorder, mild, in sustained remission 02/19/2015    No Known Allergies  Past Surgical History:  Procedure Laterality Date   NO  PAST SURGERIES      Social History   Tobacco Use   Smoking status: Former    Packs/day: 1.00    Years: 3.00    Total pack years: 3.00    Types: Cigarettes    Start date: 03/28/2015    Quit date: 03/29/2015    Years since quitting: 6.9   Smokeless tobacco: Never  Vaping Use   Vaping Use: Never used  Substance Use Topics   Alcohol use: Yes    Alcohol/week: 0.0 standard drinks of alcohol   Drug use: No     Medication list has been reviewed and updated.  Current Meds  Medication Sig   aspirin EC 81 MG tablet Take by mouth.   buPROPion (WELLBUTRIN XL) 150 MG 24 hr tablet TAKE THREE TABLETS BY MOUTH DAILY   ezetimibe (ZETIA) 10 MG tablet Take 1 tablet (10 mg total) by mouth daily.   famotidine (PEPCID) 20 MG tablet Take by mouth.   gabapentin (NEURONTIN) 400 MG capsule Take 1 capsule (400 mg total) by mouth 3 (three) times daily.   ipratropium (ATROVENT) 0.06 % nasal spray Place 2 sprays into both nostrils 4 (four) times daily.   meloxicam (MOBIC) 15 MG tablet Take 15 mg by mouth daily as needed.   Misc Natural Products (PROSTATE) CAPS Take by mouth.   sildenafil (REVATIO) 20 MG tablet TAKE 1-5 TABLETS BY MOUTH DAILY   venlafaxine XR (EFFEXOR-XR) 75 MG 24 hr capsule TAKE THREE CAPSULES BY MOUTH EVERY MORNING WITH BREAKFAST       03/08/2022   11:02 AM 11/05/2021   10:30 AM 05/04/2021    9:00 AM 12/31/2020    8:53 AM  GAD 7 : Generalized Anxiety Score  Nervous, Anxious, on Edge 0 1 0 1  Control/stop worrying 0 1 0 0  Worry too much - different things 0 1 0 0  Trouble relaxing 0 1 0 0  Restless 0 0 0 0  Easily annoyed or irritable 0 2 0 1  Afraid - awful might happen 0 0 0 0  Total GAD 7 Score 0 6 0 2  Anxiety Difficulty Not difficult at all Somewhat difficult Not difficult at all Not difficult at all       03/08/2022   11:02 AM 11/05/2021   10:30 AM 05/04/2021    9:00 AM  Depression screen PHQ 2/9  Decreased Interest 0 0 0  Down, Depressed, Hopeless 0 0 0  PHQ - 2  Score 0 0 0  Altered sleeping 0 0 0  Tired, decreased energy 0 0 0  Change in appetite 0 0 0  Feeling bad or failure about yourself  0 0 0  Trouble concentrating 0 0 0  Moving slowly or fidgety/restless 0 0 0  Suicidal thoughts 0 0 0  PHQ-9 Score 0 0 0  Difficult doing work/chores Not difficult at all Not difficult at all Not difficult at all    BP Readings from Last 3 Encounters:  03/08/22 120/76  11/22/21 (!) 147/110  11/05/21 136/86    Physical Exam Vitals and  nursing note reviewed.  Constitutional:      General: He is not in acute distress.    Appearance: Normal appearance. He is well-developed.  HENT:     Head: Normocephalic and atraumatic.  Cardiovascular:     Rate and Rhythm: Normal rate and regular rhythm.  Pulmonary:     Effort: Pulmonary effort is normal. No respiratory distress.     Breath sounds: No wheezing or rhonchi.  Musculoskeletal:        General: Deformity present.     Cervical back: Normal range of motion.     Right lower leg: No edema.     Left lower leg: No edema.  Skin:    General: Skin is warm and dry.     Findings: No rash.  Neurological:     General: No focal deficit present.     Mental Status: He is alert and oriented to person, place, and time.  Psychiatric:        Mood and Affect: Mood normal.        Behavior: Behavior normal.     Wt Readings from Last 3 Encounters:  03/08/22 264 lb (119.7 kg)  11/22/21 238 lb 1.6 oz (108 kg)  11/05/21 238 lb (108 kg)    BP 120/76   Pulse (!) 109   Ht 6' (1.829 m)   Wt 264 lb (119.7 kg)   SpO2 94%   BMI 35.80 kg/m   Assessment and Plan: 1. Type II diabetes mellitus with complication (HCC) BP have been controlled at home. BS are not being checked - concerned about recent weight gain and steroid injections making A1C higher. Will check labs and advise. - Comprehensive metabolic panel - Hemoglobin A1c  2. Hyperlipidemia associated with type 2 diabetes mellitus ([place]) Continue Zetia  therapy.  3. Recurrent major depressive disorder, in partial remission (HCC) Clinically stable on current regimen with good control of symptoms, No SI or HI. Will continue current therapy. - buPROPion (WELLBUTRIN XL) 150 MG 24 hr tablet; Take 3 tablets (450 mg total) by mouth daily.  Dispense: 270 tablet; Refill: 1 - venlafaxine XR (EFFEXOR-XR) 75 MG 24 hr capsule; Take 3 capsules (225 mg total) by mouth daily with breakfast.  Dispense: 270 capsule; Refill: 1  4. Cervical radiculopathy And foot pain - continue gabapentin bid. - gabapentin (NEURONTIN) 400 MG capsule; Take 1 capsule (400 mg total) by mouth 2 (two) times daily.  Dispense: 180 capsule; Refill: 1  5. Congenital pes planus, unspecified laterality Now seeing an ankle specialist at [place].   Partially dictated using Editor, commissioning. Any errors are unintentional.  Halina Maidens, MD [facility]  03/08/2022

## 2022-03-09 LAB — COMPREHENSIVE METABOLIC PANEL
ALT: 47 IU/L — ABNORMAL HIGH (ref 0–44)
AST: 34 IU/L (ref 0–40)
Albumin/Globulin Ratio: 1.8 (ref 1.2–2.2)
Albumin: 4.4 g/dL (ref 3.9–4.9)
Alkaline Phosphatase: 64 IU/L (ref 44–121)
BUN/Creatinine Ratio: 30 — ABNORMAL HIGH (ref 10–24)
BUN: 25 mg/dL (ref 8–27)
Bilirubin Total: 0.2 mg/dL (ref 0.0–1.2)
CO2: 23 mmol/L (ref 20–29)
Calcium: 9.2 mg/dL (ref 8.6–10.2)
Chloride: 102 mmol/L (ref 96–106)
Creatinine, Ser: 0.82 mg/dL (ref 0.76–1.27)
Globulin, Total: 2.4 g/dL (ref 1.5–4.5)
Glucose: 108 mg/dL — ABNORMAL HIGH (ref 70–99)
Potassium: 4.4 mmol/L (ref 3.5–5.2)
Sodium: 139 mmol/L (ref 134–144)
Total Protein: 6.8 g/dL (ref 6.0–8.5)
eGFR: 100 mL/min/{1.73_m2} (ref >59)

## 2022-03-09 LAB — HEMOGLOBIN A1C
Est. average glucose Bld gHb Est-mCnc: 131 mg/dL
Hgb A1c MFr Bld: 6.2 % — ABNORMAL HIGH (ref 4.8–5.6)

## 2022-03-24 ENCOUNTER — Ambulatory Visit: Payer: Medicare Other

## 2022-03-26 ENCOUNTER — Ambulatory Visit (INDEPENDENT_AMBULATORY_CARE_PROVIDER_SITE_OTHER): Payer: Medicare Other

## 2022-03-26 NOTE — Patient Instructions (Signed)
Health Maintenance, Male Adopting a healthy lifestyle and getting preventive care are important in promoting health and wellness. Ask your health care provider about: The right schedule for you to have regular tests and exams. Things you can do on your own to prevent diseases and keep yourself healthy. What should I know about diet, weight, and exercise? Eat a healthy diet  Eat a diet that includes plenty of vegetables, fruits, low-fat dairy products, and lean protein. Do not eat a lot of foods that are high in solid fats, added sugars, or sodium. Maintain a healthy weight Body mass index (BMI) is a measurement that can be used to identify possible weight problems. It estimates body fat based on height and weight. Your health care provider can help determine your BMI and help you achieve or maintain a healthy weight. Get regular exercise Get regular exercise. This is one of the most important things you can do for your health. Most adults should: Exercise for at least 150 minutes each week. The exercise should increase your heart rate and make you sweat (moderate-intensity exercise). Do strengthening exercises at least twice a week. This is in addition to the moderate-intensity exercise. Spend less time sitting. Even light physical activity can be beneficial. Watch cholesterol and blood lipids Have your blood tested for lipids and cholesterol at 62 years of age, then have this test every 5 years. You may need to have your cholesterol levels checked more often if: Your lipid or cholesterol levels are high. You are older than 62 years of age. You are at high risk for heart disease. What should I know about cancer screening? Many types of cancers can be detected early and may often be prevented. Depending on your health history and family history, you may need to have cancer screening at various ages. This may include screening for: Colorectal cancer. Prostate cancer. Skin cancer. Lung  cancer. What should I know about heart disease, diabetes, and high blood pressure? Blood pressure and heart disease High blood pressure causes heart disease and increases the risk of stroke. This is more likely to develop in people who have high blood pressure readings or are overweight. Talk with your health care provider about your target blood pressure readings. Have your blood pressure checked: Every 3-5 years if you are 18-39 years of age. Every year if you are 40 years old or older. If you are between the ages of 65 and 75 and are a current or former smoker, ask your health care provider if you should have a one-time screening for abdominal aortic aneurysm (AAA). Diabetes Have regular diabetes screenings. This checks your fasting blood sugar level. Have the screening done: Once every three years after age 45 if you are at a normal weight and have a low risk for diabetes. More often and at a younger age if you are overweight or have a high risk for diabetes. What should I know about preventing infection? Hepatitis B If you have a higher risk for hepatitis B, you should be screened for this virus. Talk with your health care provider to find out if you are at risk for hepatitis B infection. Hepatitis C Blood testing is recommended for: Everyone born from 1945 through 1965. Anyone with known risk factors for hepatitis C. Sexually transmitted infections (STIs) You should be screened each year for STIs, including gonorrhea and chlamydia, if: You are sexually active and are younger than 62 years of age. You are older than 62 years of age and your   health care provider tells you that you are at risk for this type of infection. Your sexual activity has changed since you were last screened, and you are at increased risk for chlamydia or gonorrhea. Ask your health care provider if you are at risk. Ask your health care provider about whether you are at high risk for HIV. Your health care provider  may recommend a prescription medicine to help prevent HIV infection. If you choose to take medicine to prevent HIV, you should first get tested for HIV. You should then be tested every 3 months for as long as you are taking the medicine. Follow these instructions at home: Alcohol use Do not drink alcohol if your health care provider tells you not to drink. If you drink alcohol: Limit how much you have to 0-2 drinks a day. Know how much alcohol is in your drink. In the [place] one drink equals one 12 oz bottle of beer (355 mL), one 5 oz glass of wine (148 mL), or one 1 oz glass of hard liquor (44 mL). Lifestyle Do not use any products that contain nicotine or tobacco. These products include cigarettes, chewing tobacco, and vaping devices, such as e-cigarettes. If you need help quitting, ask your health care provider. Do not use street drugs. Do not share needles. Ask your health care provider for help if you need support or information about quitting drugs. General instructions Schedule regular health, dental, and eye exams. Stay current with your vaccines. Tell your health care provider if: You often feel depressed. You have ever been abused or do not feel safe at home. Summary Adopting a healthy lifestyle and getting preventive care are important in promoting health and wellness. Follow your health care provider's instructions about healthy diet, exercising, and getting tested or screened for diseases. Follow your health care provider's instructions on monitoring your cholesterol and blood pressure. This information is not intended to replace advice given to you by your health care provider. Make sure you discuss any questions you have with your health care provider. Document Revised: 07/28/2020 Document Reviewed: 07/28/2020 Elsevier Patient Education  2023 Elsevier Inc.  

## 2022-03-26 NOTE — Progress Notes (Signed)
Attempted to contact the patient for his Annual [employer], no answer. LMOM

## 2022-04-09 IMAGING — CR DG CERVICAL SPINE COMPLETE 4+V
7 series · 7 of 7 positions shown · non-contrast
Comparison: None.

CLINICAL DATA: Cervicalgia with left upper extremity radicular type
symptoms

EXAM:
CERVICAL SPINE - COMPLETE 4+ VIEW

[c-spine lat]
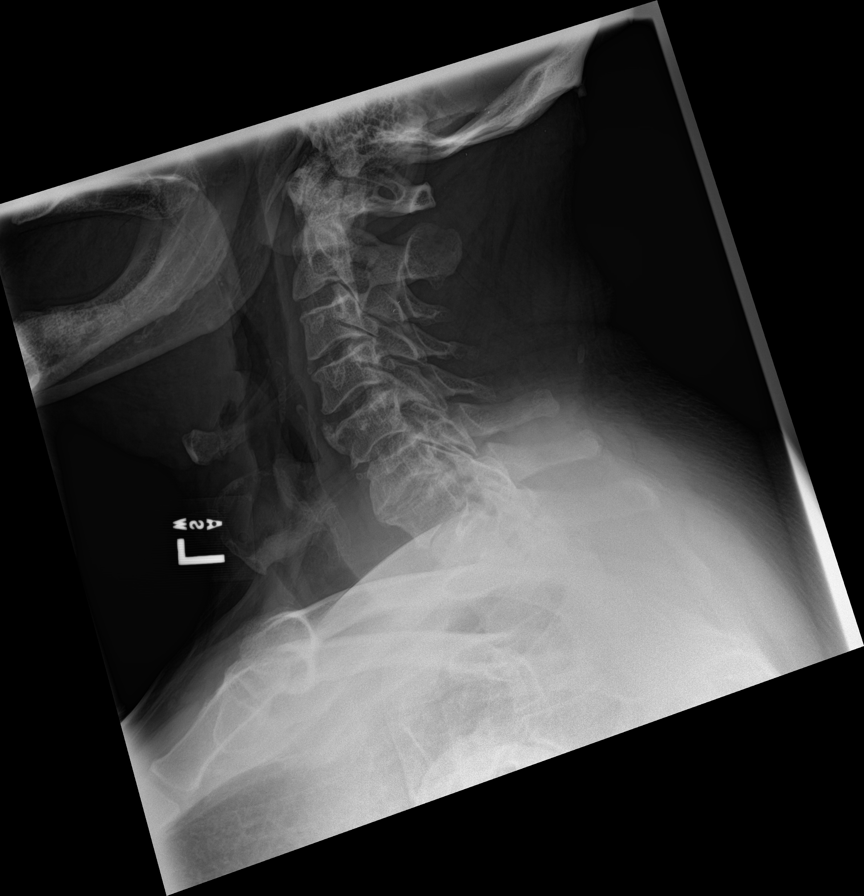

[c-spine obl (1 of 3)]
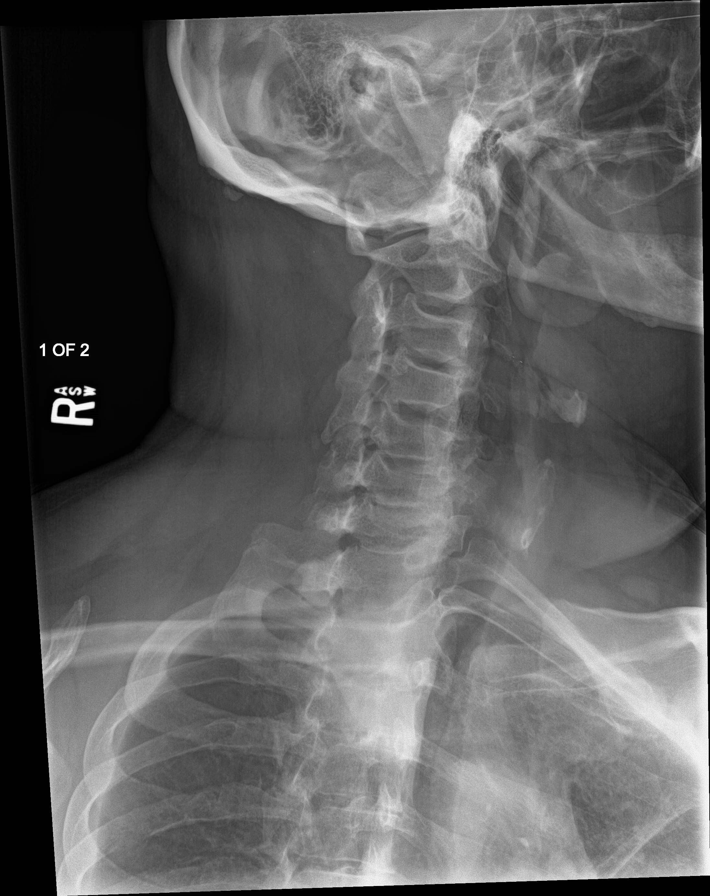

[c-spine obl (2 of 3)]
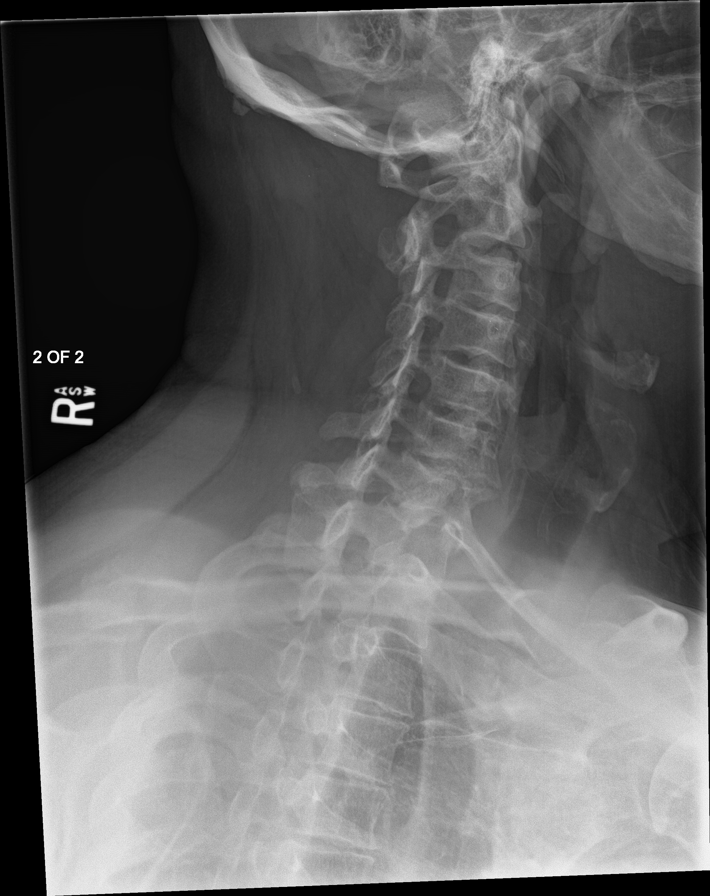

[c-spine ap]
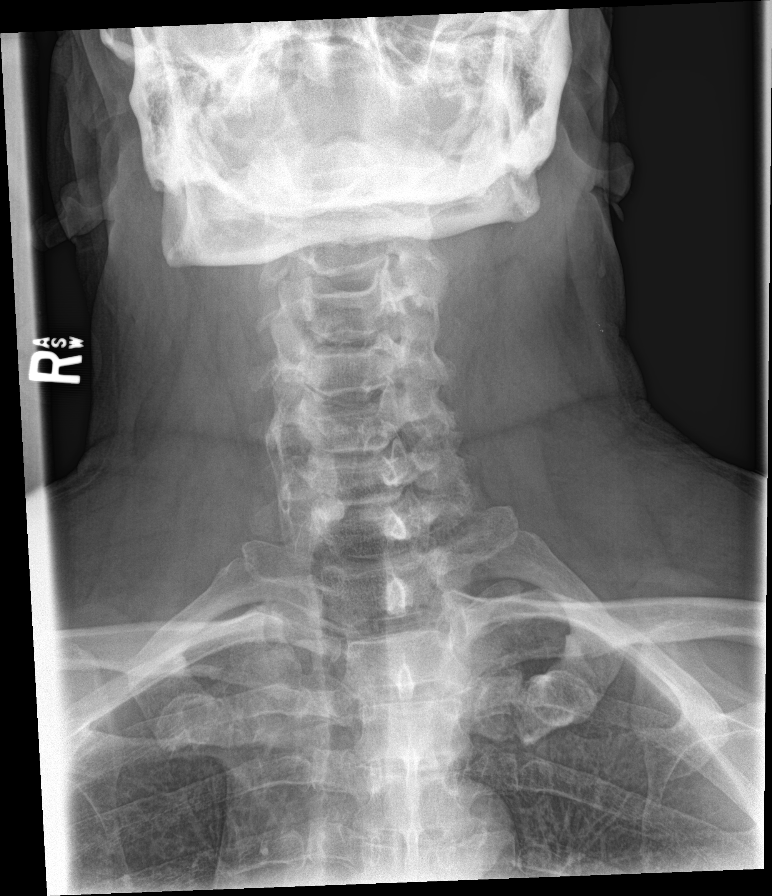

[c-spine open mouth]
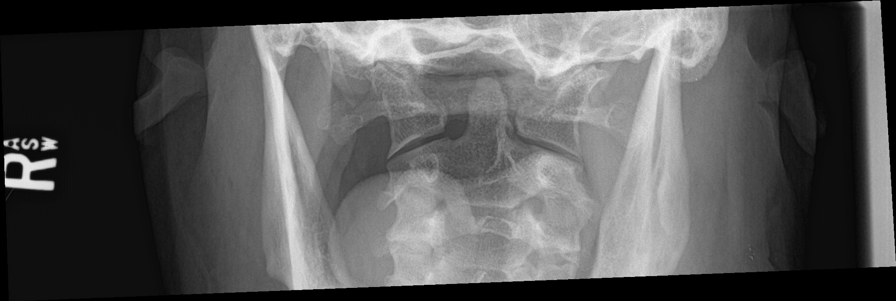

[ct-spine swimmers]
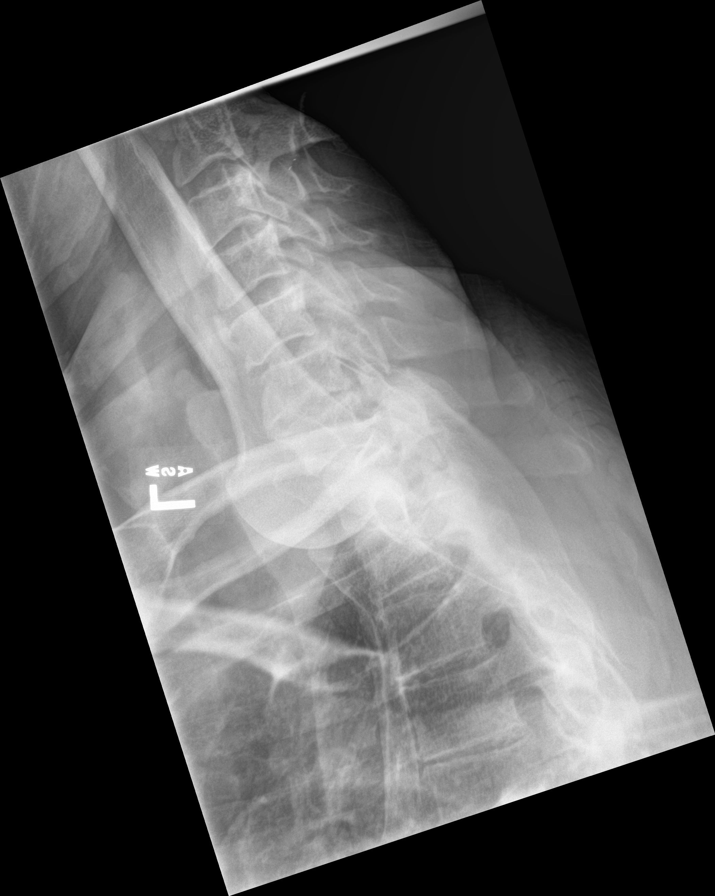

[c-spine obl (3 of 3)]
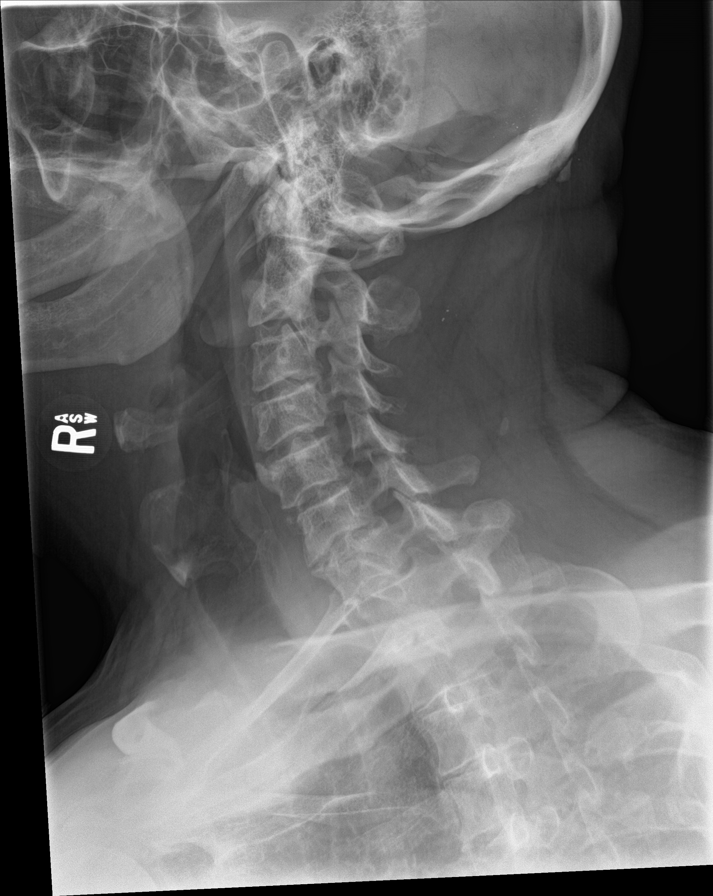

[7 of 7 positions shown; findings below may reference images not displayed]

FINDINGS: Frontal, lateral, open-mouth odontoid, and bilateral oblique views
were obtained. There is no fracture or spondylolisthesis.
Prevertebral soft tissues and predental space regions are normal.
There is severe disc space narrowing at C6-7 and to a slightly
lesser extent at C7-T1. There is slight disc space narrowing at C3-4
and C5-6. There are anterior osteophytes at C5, C6, and C7.
Calcification is noted in the anterior ligament at C3-4, C4-5, and
C5-6. There is facet hypertrophy with exit foraminal narrowing at
C4-5, C5-6, and C6-7 bilaterally. Lung apices are clear.
IMPRESSION: Osteoarthritic change at several levels. No fracture or
spondylolisthesis.

## 2022-04-21 ENCOUNTER — Other Ambulatory Visit: Payer: Self-pay | Admitting: Internal Medicine

## 2022-04-21 DIAGNOSIS — E1169 Type 2 diabetes mellitus with other specified complication: Secondary | ICD-10-CM

## 2022-04-21 NOTE — Telephone Encounter (Signed)
Requested Prescriptions  Pending Prescriptions Disp Refills   ezetimibe (ZETIA) 10 MG tablet [Pharmacy Med Name: EZETIMIBE 10 MG TABLET] 90 tablet 1    Sig: TAKE ONE TABLET BY MOUTH DAILY     Cardiovascular:  Antilipid - Sterol Transport Inhibitors Failed - 04/21/2022  3:21 PM      Failed - ALT in normal range and within 360 days    ALT  Date Value Ref Range Status  03/08/2022 47 (H) 0 - 44 IU/L Final         Failed - Lipid Panel in normal range within the last 12 months    Cholesterol, Total  Date Value Ref Range Status  11/05/2021 221 (H) 100 - 199 mg/dL Final   LDL Chol Calc (NIH)  Date Value Ref Range Status  11/05/2021 110 (H) 0 - 99 mg/dL Final   HDL  Date Value Ref Range Status  11/05/2021 68 >39 mg/dL Final   Triglycerides  Date Value Ref Range Status  11/05/2021 254 (H) 0 - 149 mg/dL Final         Passed - AST in normal range and within 360 days    AST  Date Value Ref Range Status  03/08/2022 34 0 - 40 IU/L Final         Passed - Patient is not pregnant      Passed - Valid encounter within last 12 months    Recent Outpatient Visits           1 month ago Type II diabetes mellitus with complication ([place])   [place] [facility] at [facility], Jesse Sans, MD   5 months ago Type II diabetes mellitus with complication [facility])   [place] [facility] at [facility], Jesse Sans, MD   11 months ago Type II diabetes mellitus with complication [facility])   [place] [facility] at [facility], Jesse Sans, MD   1 year ago Type II diabetes mellitus with complication [facility])   [place] [facility] at [facility], Jesse Sans, MD   1 year ago Type II diabetes mellitus with complication [facility])   [place] [facility], Jesse Sans, MD       Future Appointments             In 3 weeks Army Melia Jesse Sans,  MD [place] at [facility], [facility]   In 4 months Army Melia, Jesse Sans, MD [place] at [facility], [facility]

## 2022-05-05 ENCOUNTER — Ambulatory Visit (INDEPENDENT_AMBULATORY_CARE_PROVIDER_SITE_OTHER): Payer: Medicare Other | Admitting: Internal Medicine

## 2022-05-05 ENCOUNTER — Encounter: Payer: Self-pay | Admitting: Internal Medicine

## 2022-05-05 VITALS — BP 146/80 | HR 94 | Ht 72.0 in | Wt 256.6 lb

## 2022-05-05 DIAGNOSIS — R030 Elevated blood-pressure reading, without diagnosis of hypertension: Secondary | ICD-10-CM

## 2022-05-05 DIAGNOSIS — Z01818 Encounter for other preprocedural examination: Secondary | ICD-10-CM

## 2022-05-05 DIAGNOSIS — E118 Type 2 diabetes mellitus with unspecified complications: Secondary | ICD-10-CM

## 2022-05-05 NOTE — Progress Notes (Signed)
Date:  05/05/2022   Name:  Anthony Cohen   DOB:  05-30-1960   MRN:  [id number]   Chief Complaint: No chief complaint on file. Foot surgery scheduled with Dr. Jacobo Forest.  Flatfoot correction arthrodesis and possible hallux MTP fusion. Requesting EKG, CMP, CBC and A1C.  These have all been done within the timeframe indicated except for CBC.  HPI  Lab Results  Component Value Date   NA 139 03/08/2022   K 4.4 03/08/2022   CO2 23 03/08/2022   GLUCOSE 108 (H) 03/08/2022   BUN 25 03/08/2022   CREATININE 0.82 03/08/2022   CALCIUM 9.2 03/08/2022   EGFR 100 03/08/2022   GFRNONAA >60 09/21/2019   Lab Results  Component Value Date   CHOL 221 (H) 11/05/2021   HDL 68 11/05/2021   LDLCALC 110 (H) 11/05/2021   TRIG 254 (H) 11/05/2021   CHOLHDL 3.3 11/05/2021   Lab Results  Component Value Date   TSH 1.810 11/05/2021   Lab Results  Component Value Date   HGBA1C 6.2 (H) 03/08/2022   Lab Results  Component Value Date   WBC 5.2 11/05/2021   HGB 14.2 11/05/2021   HCT 42.9 11/05/2021   MCV 93 11/05/2021   PLT 242 11/05/2021   Lab Results  Component Value Date   ALT 47 (H) 03/08/2022   AST 34 03/08/2022   ALKPHOS 64 03/08/2022   BILITOT 0.2 03/08/2022   No results found for: "25OHVITD2", "25OHVITD3", "VD25OH"   Review of Systems  Constitutional:  Negative for appetite change, fatigue and unexpected weight change.  Eyes:  Negative for visual disturbance.  Respiratory:  Negative for cough, shortness of breath and wheezing.   Cardiovascular:  Negative for chest pain, palpitations and leg swelling.  Gastrointestinal:  Negative for abdominal pain and blood in stool.  Endocrine: Negative for polydipsia and polyuria.  Genitourinary:  Negative for dysuria and hematuria.  Musculoskeletal:  Positive for arthralgias and gait problem.  Skin:  Negative for color change and rash.  Neurological:  Negative for tremors, numbness and headaches.  Psychiatric/Behavioral:  Negative for  dysphoric mood.     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   Erectile dysfunction 06/09/2020   Pinched nerve in shoulder, left 09/11/2019   Pes planus, congenital 05/26/2018   Restless leg syndrome 05/26/2018   Arthritis of knee 10/25/2016   GERD without esophagitis 04/20/2016   Type II diabetes mellitus with complication ([place]) [id number]   Hyperlipidemia associated with type 2 diabetes mellitus ([place]) 06/16/2015   OSA (obstructive sleep apnea) 03/10/2015   Recurrent major depressive disorder, in partial remission ([place]) 02/19/2015   Family history of digestive disorder 02/19/2015   Tobacco use disorder, mild, in sustained remission 02/19/2015    No Known Allergies  Past Surgical History:  Procedure Laterality Date   HIP ARTHROPLASTY Right 11/2021    Social History   Tobacco Use   Smoking status: Former    Packs/day: 1.00    Years: 3.00    Total pack years: 3.00    Types: Cigarettes    Start date: 03/28/2015    Quit date: 03/29/2015    Years since quitting: 7.1   Smokeless tobacco: Never  Vaping Use   Vaping Use: Never used  Substance Use Topics   Alcohol use: Yes    Alcohol/week: 0.0 standard drinks of alcohol  Drug use: No     Medication list has been reviewed and updated.  Current Meds  Medication Sig   buPROPion (WELLBUTRIN XL) 150 MG 24 hr tablet Take 3 tablets (450 mg total) by mouth daily.   ezetimibe (ZETIA) 10 MG tablet TAKE ONE TABLET BY MOUTH DAILY   famotidine (PEPCID) 20 MG tablet Take by mouth.   gabapentin (NEURONTIN) 400 MG capsule Take 1 capsule (400 mg total) by mouth 2 (two) times daily.   Misc Natural Products (PROSTATE) CAPS Take by mouth.   Multiple Vitamin (MULTI-VITAMIN) tablet Take 1 tablet by mouth daily.   venlafaxine XR (EFFEXOR-XR) 75 MG 24 hr capsule Take 3 capsules (225 mg total) by mouth  daily with breakfast.       05/05/2022    9:49 AM 03/08/2022   11:02 AM 11/05/2021   10:30 AM 05/04/2021    9:00 AM  GAD 7 : Generalized Anxiety Score  Nervous, Anxious, on Edge 0 0 1 0  Control/stop worrying 0 0 1 0  Worry too much - different things 0 0 1 0  Trouble relaxing 0 0 1 0  Restless 0 0 0 0  Easily annoyed or irritable 0 0 2 0  Afraid - awful might happen 0 0 0 0  Total GAD 7 Score 0 0 6 0  Anxiety Difficulty Not difficult at all Not difficult at all Somewhat difficult Not difficult at all       05/05/2022    9:49 AM 03/08/2022   11:02 AM 11/05/2021   10:30 AM  Depression screen PHQ 2/9  Decreased Interest 0 0 0  Down, Depressed, Hopeless 0 0 0  PHQ - 2 Score 0 0 0  Altered sleeping 0 0 0  Tired, decreased energy 1 0 0  Change in appetite 0 0 0  Feeling bad or failure about yourself  0 0 0  Trouble concentrating 0 0 0  Moving slowly or fidgety/restless 0 0 0  Suicidal thoughts 0 0 0  PHQ-9 Score 1 0 0  Difficult doing work/chores Not difficult at all Not difficult at all Not difficult at all    BP Readings from Last 3 Encounters:  05/05/22 (!) 146/80  03/08/22 120/76  11/22/21 (!) 147/110    Physical Exam Vitals and nursing note reviewed.  Constitutional:      General: He is not in acute distress.    Appearance: He is well-developed.  HENT:     Head: Normocephalic and atraumatic.  Cardiovascular:     Rate and Rhythm: Normal rate and regular rhythm.     Pulses: Normal pulses.     Heart sounds: No murmur heard. Pulmonary:     Effort: Pulmonary effort is normal. No respiratory distress.  Musculoskeletal:        General: Tenderness and deformity present.     Cervical back: Normal range of motion.     Right lower leg: No edema.     Left lower leg: No edema.  Lymphadenopathy:     Cervical: No cervical adenopathy.  Skin:    General: Skin is warm and dry.     Findings: No rash.  Neurological:     Mental Status: He is alert and oriented to person,  place, and time.  Psychiatric:        Mood and Affect: Mood normal.        Behavior: Behavior normal.     Wt Readings from Last 3 Encounters:  05/05/22 256 lb 9.6 oz (116.4  kg)  03/08/22 264 lb (119.7 kg)  11/22/21 238 lb 1.6 oz (108 kg)    BP (!) 146/80   Pulse 94   Ht 6' (1.829 m)   Wt 256 lb 9.6 oz (116.4 kg)   SpO2 93%   BMI 34.80 kg/m   Assessment and Plan: Problem List Items Addressed This Visit       Endocrine   Type II diabetes mellitus with complication (HCC) (Chronic)    Clinically stable with diet control only Last A1C 6.2      Other Visit Diagnoses     Preop examination    -  Primary   scheduled form left foot flatfoot correction with arthrodesis/possible hallux MTP fusion will forward labs and EKG with form No contraindication to surgery   Relevant Orders   CBC with Differential/Platelet   Elevated blood pressure reading       likely due to large amount of caffeine consumed this AM recommend monitoring at home follow up if needed        Partially dictated using Dragon software. Any errors are unintentional.  Halina Maidens, MD [facility]  05/05/2022

## 2022-05-05 NOTE — Assessment & Plan Note (Signed)
Clinically stable with diet control only Last A1C 6.2

## 2022-05-06 ENCOUNTER — Ambulatory Visit (INDEPENDENT_AMBULATORY_CARE_PROVIDER_SITE_OTHER): Payer: Medicare Other

## 2022-05-06 VITALS — Ht 72.0 in | Wt 256.0 lb

## 2022-05-06 DIAGNOSIS — Z0000 Encounter for general adult medical examination without abnormal findings: Secondary | ICD-10-CM | POA: Diagnosis not present

## 2022-05-06 LAB — CBC WITH DIFFERENTIAL/PLATELET
Basophils Absolute: 0 10*3/uL (ref 0.0–0.2)
Basos: 1 %
EOS (ABSOLUTE): 0.2 10*3/uL (ref 0.0–0.4)
Eos: 5 %
Hematocrit: 42.7 % (ref 37.5–51.0)
Hemoglobin: 14.3 g/dL (ref 13.0–17.7)
Immature Grans (Abs): 0 10*3/uL (ref 0.0–0.1)
Immature Granulocytes: 0 %
Lymphocytes Absolute: 1.4 10*3/uL (ref 0.7–3.1)
Lymphs: 35 %
MCH: 32.1 pg (ref 26.6–33.0)
MCHC: 33.5 g/dL (ref 31.5–35.7)
MCV: 96 fL (ref 79–97)
Monocytes Absolute: 0.4 10*3/uL (ref 0.1–0.9)
Monocytes: 11 %
Neutrophils Absolute: 1.9 10*3/uL (ref 1.4–7.0)
Neutrophils: 48 %
Platelets: 204 10*3/uL (ref 150–450)
RBC: 4.45 x10E6/uL (ref 4.14–5.80)
RDW: 13.6 % (ref 11.6–15.4)
WBC: 3.9 10*3/uL (ref 3.4–10.8)

## 2022-05-06 NOTE — Patient Instructions (Signed)
Anthony Cohen , Thank you for taking time to come for your Medicare Wellness Visit. I appreciate your ongoing commitment to your health goals. Please review the following plan we discussed and let me know if I can assist you in the future.   These are the goals we discussed:  Goals      DIET - EAT MORE FRUITS AND VEGETABLES     Weight (lb) < 200 lb (90.7 kg)     Lose weight        This is a list of the screening recommended for you and due dates:  Health Maintenance  Topic Date Due   Eye exam for diabetics  Never done   HIV Screening  Never done   DTaP/Tdap/Td vaccine (1 - Tdap) Never done   Zoster (Shingles) Vaccine (1 of 2) Never done   COVID-19 Vaccine (4 - 2023-24 season) 11/20/2021   Yearly kidney health urinalysis for diabetes  05/04/2022   Flu Shot  06/20/2022*   Hemoglobin A1C  09/07/2022   Yearly kidney function blood test for diabetes  03/09/2023   Complete foot exam   05/06/2023   Medicare Annual Wellness Visit  05/07/2023   Colon Cancer Screening  10/03/2023   Hepatitis C Screening: USPSTF Recommendation to screen - Ages 18-79 yo.  Completed   Pneumococcal Vaccination  Aged Out   HPV Vaccine  Aged Out  *Topic was postponed. The date shown is not the original due date.    Advanced directives: no  Conditions/risks identified: none  Next appointment: Follow up in one year for your annual wellness visit 05/11/23 @ 2:30 pm by phone  Preventive Care 40-64 Years, Male Preventive care refers to lifestyle choices and visits with your health care provider that can promote health and wellness. What does preventive care include? A yearly physical exam. This is also called an annual well check. Dental exams once or twice a year. Routine eye exams. Ask your health care provider how often you should have your eyes checked. Personal lifestyle choices, including: Daily care of your teeth and gums. Regular physical activity. Eating a healthy diet. Avoiding tobacco and drug  use. Limiting alcohol use. Practicing safe sex. Taking low-dose aspirin every day starting at age 64. What happens during an annual well check? The services and screenings done by your health care provider during your annual well check will depend on your age, overall health, lifestyle risk factors, and family history of disease. Counseling  Your health care provider may ask you questions about your: Alcohol use. Tobacco use. Drug use. Emotional well-being. Home and relationship well-being. Sexual activity. Eating habits. Work and work Statistician. Screening  You may have the following tests or measurements: Height, weight, and BMI. Blood pressure. Lipid and cholesterol levels. These may be checked every 5 years, or more frequently if you are over 102 years old. Skin check. Lung cancer screening. You may have this screening every year starting at age 50 if you have a 30-pack-year history of smoking and currently smoke or have quit within the past 15 years. Fecal occult blood test (FOBT) of the stool. You may have this test every year starting at age 11. Flexible sigmoidoscopy or colonoscopy. You may have a sigmoidoscopy every 5 years or a colonoscopy every 10 years starting at age 69. Prostate cancer screening. Recommendations will vary depending on your family history and other risks. Hepatitis C blood test. Hepatitis B blood test. Sexually transmitted disease (STD) testing. Diabetes screening. This is done by checking  your blood sugar (glucose) after you have not eaten for a while (fasting). You may have this done every 1-3 years. Discuss your test results, treatment options, and if necessary, the need for more tests with your health care provider. Vaccines  Your health care provider may recommend certain vaccines, such as: Influenza vaccine. This is recommended every year. Tetanus, diphtheria, and acellular pertussis (Tdap, Td) vaccine. You may need a Td booster every 10  years. Zoster vaccine. You may need this after age 90. Pneumococcal 13-valent conjugate (PCV13) vaccine. You may need this if you have certain conditions and have not been vaccinated. Pneumococcal polysaccharide (PPSV23) vaccine. You may need one or two doses if you smoke cigarettes or if you have certain conditions. Talk to your health care provider about which screenings and vaccines you need and how often you need them. This information is not intended to replace advice given to you by your health care provider. Make sure you discuss any questions you have with your health care provider. Document Released: 04/04/2015 Document Revised: 11/26/2015 Document Reviewed: 01/07/2015 Elsevier Interactive Patient Education  2017 [place] Prevention in the Home Falls can cause injuries. They can happen to people of all ages. There are many things you can do to make your home safe and to help prevent falls. What can I do on the outside of my home? Regularly fix the edges of walkways and driveways and fix any cracks. Remove anything that might make you trip as you walk through a door, such as a raised step or threshold. Trim any bushes or trees on the path to your home. Use bright outdoor lighting. Clear any walking paths of anything that might make someone trip, such as rocks or tools. Regularly check to see if handrails are loose or broken. Make sure that both sides of any steps have handrails. Any raised decks and porches should have guardrails on the edges. Have any leaves, snow, or ice cleared regularly. Use sand or salt on walking paths during winter. Clean up any spills in your garage right away. This includes oil or grease spills. What can I do in the bathroom? Use night lights. Install grab bars by the toilet and in the tub and shower. Do not use towel bars as grab bars. Use non-skid mats or decals in the tub or shower. If you need to sit down in the shower, use a plastic,  non-slip stool. Keep the floor dry. Clean up any water that spills on the floor as soon as it happens. Remove soap buildup in the tub or shower regularly. Attach bath mats securely with double-sided non-slip rug tape. Do not have throw rugs and other things on the floor that can make you trip. What can I do in the bedroom? Use night lights. Make sure that you have a light by your bed that is easy to reach. Do not use any sheets or blankets that are too big for your bed. They should not hang down onto the floor. Have a firm chair that has side arms. You can use this for support while you get dressed. Do not have throw rugs and other things on the floor that can make you trip. What can I do in the kitchen? Clean up any spills right away. Avoid walking on wet floors. Keep items that you use a lot in easy-to-reach places. If you need to reach something above you, use a strong step stool that has a grab bar. Keep electrical cords out  of the way. Do not use floor polish or wax that makes floors slippery. If you must use wax, use non-skid floor wax. Do not have throw rugs and other things on the floor that can make you trip. What can I do with my stairs? Do not leave any items on the stairs. Make sure that there are handrails on both sides of the stairs and use them. Fix handrails that are broken or loose. Make sure that handrails are as long as the stairways. Check any carpeting to make sure that it is firmly attached to the stairs. Fix any carpet that is loose or worn. Avoid having throw rugs at the top or bottom of the stairs. If you do have throw rugs, attach them to the floor with carpet tape. Make sure that you have a light switch at the top of the stairs and the bottom of the stairs. If you do not have them, ask someone to add them for you. What else can I do to help prevent falls? Wear shoes that: Do not have high heels. Have rubber bottoms. Are comfortable and fit you well. Are closed  at the toe. Do not wear sandals. If you use a stepladder: Make sure that it is fully opened. Do not climb a closed stepladder. Make sure that both sides of the stepladder are locked into place. Ask someone to hold it for you, if possible. Clearly mark and make sure that you can see: Any grab bars or handrails. First and last steps. Where the edge of each step is. Use tools that help you move around (mobility aids) if they are needed. These include: Canes. Walkers. Scooters. Crutches. Turn on the lights when you go into a dark area. Replace any light bulbs as soon as they burn out. Set up your furniture so you have a clear path. Avoid moving your furniture around. If any of your floors are uneven, fix them. If there are any pets around you, be aware of where they are. Review your medicines with your doctor. Some medicines can make you feel dizzy. This can increase your chance of falling. Ask your doctor what other things that you can do to help prevent falls. This information is not intended to replace advice given to you by your health care provider. Make sure you discuss any questions you have with your health care provider. Document Released: 01/02/2009 Document Revised: 08/14/2015 Document Reviewed: 04/12/2014 Elsevier Interactive Patient Education  2017 [employer].

## 2022-05-06 NOTE — Progress Notes (Signed)
I connected with  Anthony Cohen on 05/06/22 by a audio enabled telemedicine application and verified that I am speaking with the correct person using two identifiers.  Patient Location: Home  Provider Location: Office/Clinic  I discussed the limitations of evaluation and management by telemedicine. The patient expressed understanding and agreed to proceed.  Subjective:   Anthony Cohen is a 62 y.o. male who presents for Medicare Annual/Subsequent preventive examination.  Review of Systems     Cardiac Risk Factors include: advanced age (>65mn, >>82women);male gender     Objective:    Today's Vitals   05/06/22 1006  PainSc: 6    There is no height or weight on file to calculate BMI.     05/06/2022   10:10 AM 11/22/2021   12:28 PM 10/27/2021    9:50 AM 09/21/2019    9:55 AM 04/20/2016   10:47 AM 10/17/2015    3:40 PM 06/16/2015    3:36 PM  Advanced Directives  Does Patient Have a Medical Advance Directive? No Yes No Yes No;Yes Yes Yes  Type of Advance Directive  Living will   Living will Living will   Would patient like information on creating a medical advance directive? No - Patient declined          Current Medications (verified) Outpatient Encounter Medications as of 05/06/2022  Medication Sig   buPROPion (WELLBUTRIN XL) 150 MG 24 hr tablet Take 3 tablets (450 mg total) by mouth daily.   ezetimibe (ZETIA) 10 MG tablet TAKE ONE TABLET BY MOUTH DAILY   famotidine (PEPCID) 20 MG tablet Take by mouth.   gabapentin (NEURONTIN) 400 MG capsule Take 1 capsule (400 mg total) by mouth 2 (two) times daily.   Misc Natural Products (PROSTATE) CAPS Take by mouth.   Multiple Vitamin (MULTI-VITAMIN) tablet Take 1 tablet by mouth daily.   venlafaxine XR (EFFEXOR-XR) 75 MG 24 hr capsule Take 3 capsules (225 mg total) by mouth daily with breakfast.   No facility-administered encounter medications on file as of 05/06/2022.    Allergies (verified) Patient has no known allergies.    History: History reviewed. No pertinent past medical history. Past Surgical History:  Procedure Laterality Date   HIP ARTHROPLASTY Right 11/2021   Family History  Problem Relation Age of Onset   Hypertension Mother    Breast cancer Mother    Social History   Socioeconomic History   Marital status: Married    Spouse name: Not on file   Number of children: Not on file   Years of education: Not on file   Highest education level: Not on file  Occupational History   Occupation: disabled    Comment: started 04/2018  Tobacco Use   Smoking status: Former    Packs/day: 1.00    Years: 3.00    Total pack years: 3.00    Types: Cigarettes    Start date: 03/28/2015    Quit date: 03/29/2015    Years since quitting: 7.1   Smokeless tobacco: Never  Vaping Use   Vaping Use: Never used  Substance and Sexual Activity   Alcohol use: Yes    Alcohol/week: 0.0 standard drinks of alcohol   Drug use: No   Sexual activity: Not on file  Other Topics Concern   Not on file  Social History Narrative   Not on file   Social Determinants of Health   Financial Resource Strain: Low Risk  (05/06/2022)   Overall Financial Resource Strain (CARDIA)  Difficulty of Paying Living Expenses: Not hard at all  Food Insecurity: No Food Insecurity (05/06/2022)   Hunger Vital Sign    Worried About Running Out of Food in the Last Year: Never true    Ran Out of Food in the Last Year: Never true  Transportation Needs: No Transportation Needs (05/06/2022)   PRAPARE - Hydrologist (Medical): No    Lack of Transportation (Non-Medical): No  Physical Activity: Inactive (05/06/2022)   Exercise Vital Sign    Days of Exercise per Week: 0 days    Minutes of Exercise per Session: 0 min  Stress: No Stress Concern Present (05/06/2022)   [place]    Feeling of Stress : Not at all  Social Connections: Moderately Integrated  (05/06/2022)   Social Connection and Isolation Panel [NHANES]    Frequency of Communication with Friends and Family: Three times a week    Frequency of Social Gatherings with Friends and Family: Once a week    Attends Religious Services: Never    Marine scientist or Organizations: Yes    Attends Music therapist: More than 4 times per year    Marital Status: Married    Tobacco Counseling Counseling given: Not Answered   Clinical Intake:  Pre-visit preparation completed: Yes  Pain : 0-10 Pain Score: 6  Pain Type: Chronic pain Pain Location: Ankle Pain Orientation: Left     Diabetes: No  How often do you need to have someone help you when you read instructions, pamphlets, or other written materials from your doctor or pharmacy?: 1 - Never  Diabetic?no  Interpreter Needed?: No  Information entered by :: Kirke Shaggy, LPN   Activities of Daily Living    05/06/2022   10:11 AM 05/02/2022   11:48 AM  In your present state of health, do you have any difficulty performing the following activities:  Hearing? 0 1  Vision? 0 0  Difficulty concentrating or making decisions? 0 0  Walking or climbing stairs? 1 1  Dressing or bathing? 0 0  Doing errands, shopping? 0 0  Preparing Food and eating ? N N  Using the Toilet? N N  In the past six months, have you accidently leaked urine? N N  Do you have problems with loss of bowel control? N N  Managing your Medications? N N  Managing your Finances? N N  Housekeeping or managing your Housekeeping? N N    Patient Care Team: Glean Hess, MD as PCP - General (Internal Medicine) Rolan Lipa, RN as Registered Nurse (Psychiatry) Chauncy Passy Bufford Lope, MD as Referring Physician (Pain Medicine)  Indicate any recent Medical Services you may have received from other than [facility] providers in the past year (date may be approximate).     Assessment:   This is a routine wellness examination for  Anthony Cohen.  Hearing/Vision screen Hearing Screening - Comments:: No aids Vision Screening - Comments:: Readers-   Dietary issues and exercise activities discussed: Current Exercise Habits: The patient does not participate in regular exercise at present, Exercise limited by: orthopedic condition(s);Other - see comments (foot and ankle pain)   Goals Addressed             This Visit's Progress    DIET - EAT MORE FRUITS AND VEGETABLES         Depression Screen    05/06/2022   10:09 AM 05/05/2022    9:49 AM  03/08/2022   11:02 AM 11/05/2021   10:30 AM 05/04/2021    9:00 AM 12/31/2020    8:53 AM 09/15/2020   10:25 AM  PHQ 2/9 Scores  PHQ - 2 Score 0 0 0 0 0 0 0  PHQ- 9 Score 0 1 0 0 0 1 1    Fall Risk    05/06/2022   10:11 AM 05/05/2022    9:49 AM 05/02/2022   11:48 AM 03/26/2022   11:38 AM 03/22/2022   11:35 AM  Fall Risk   Falls in the past year? 0 0 0 0 0  Number falls in past yr: 0 0 0 0 0  Injury with Fall? 0 0 0 0 0  Risk for fall due to : No Fall Risks No Fall Risks  No Fall Risks No Fall Risks  Follow up Falls prevention discussed;Falls evaluation completed Falls evaluation completed  Falls evaluation completed Falls evaluation completed    FALL RISK PREVENTION PERTAINING TO THE HOME:  Any stairs in or around the home? Yes  If so, are there any without handrails? No  Home free of loose throw rugs in walkways, pet beds, electrical cords, etc? Yes  Adequate lighting in your home to reduce risk of falls? Yes   ASSISTIVE DEVICES UTILIZED TO PREVENT FALLS:  Life alert? No  Use of a cane, walker or w/c? No  Grab bars in the bathroom? No  Shower chair or bench in shower? Yes  Elevated toilet seat or a handicapped toilet? No    Cognitive Function:        05/06/2022   10:12 AM  6CIT Screen  What Year? 0 points  What month? 0 points  What time? 0 points  Count back from 20 0 points  Months in reverse 0 points  Repeat phrase 0 points  Total Score 0 points     Immunizations Immunization History  Administered Date(s) Administered   PFIZER Comirnaty(Gray Top)Covid-19 Tri-Sucrose Vaccine 05/21/2019, 06/10/2019, 02/20/2020    TDAP status: Due, Education has been provided regarding the importance of this vaccine. Advised may receive this vaccine at local pharmacy or Health Dept. Aware to provide a copy of the vaccination record if obtained from local pharmacy or Health Dept. Verbalized acceptance and understanding.  Flu Vaccine status: Declined, Education has been provided regarding the importance of this vaccine but patient still declined. Advised may receive this vaccine at local pharmacy or Health Dept. Aware to provide a copy of the vaccination record if obtained from local pharmacy or Health Dept. Verbalized acceptance and understanding.  Pneumococcal vaccine status: Declined,  Education has been provided regarding the importance of this vaccine but patient still declined. Advised may receive this vaccine at local pharmacy or Health Dept. Aware to provide a copy of the vaccination record if obtained from local pharmacy or Health Dept. Verbalized acceptance and understanding.   Covid-19 vaccine status: Completed vaccines  Qualifies for Shingles Vaccine? Yes   Zostavax completed No   Shingrix Completed?: No.    Education has been provided regarding the importance of this vaccine. Patient has been advised to call insurance company to determine out of pocket expense if they have not yet received this vaccine. Advised may also receive vaccine at local pharmacy or Health Dept. Verbalized acceptance and understanding.  Screening Tests Health Maintenance  Topic Date Due   OPHTHALMOLOGY EXAM  Never done   HIV Screening  Never done   DTaP/Tdap/Td (1 - Tdap) Never done   Zoster Vaccines- Shingrix (  1 of 2) Never done   COVID-19 Vaccine (4 - 2023-24 season) 11/20/2021   Diabetic kidney evaluation - Urine ACR  05/04/2022   INFLUENZA VACCINE  06/20/2022  (Originally 10/20/2021)   HEMOGLOBIN A1C  09/07/2022   Diabetic kidney evaluation - eGFR measurement  03/09/2023   FOOT EXAM  05/06/2023   Medicare Annual Wellness (AWV)  05/07/2023   COLONOSCOPY (Pts 45-42yr Insurance coverage will need to be confirmed)  10/03/2023   Hepatitis C Screening  Completed   Pneumococcal Vaccine [id number] old  Aged Out   HPV VMasonMaintenance Due  Topic Date Due   OPHTHALMOLOGY EXAM  Never done   HIV Screening  Never done   DTaP/Tdap/Td (1 - Tdap) Never done   Zoster Vaccines- Shingrix (1 of 2) Never done   COVID-19 Vaccine (4 - 2023-24 season) 11/20/2021   Diabetic kidney evaluation - Urine ACR  05/04/2022    Colorectal cancer screening: Type of screening: Colonoscopy. Completed 10/02/13. Repeat every 10 years  Lung Cancer Screening: (Low Dose CT Chest recommended if Age 62-80years, 30 pack-year currently smoking OR have quit w/in 15years.) does qualify.   Lung Cancer Screening Referral: had one done, doesn't want another  Additional Screening:  Hepatitis C Screening: does qualify; Completed 10/25/16  Vision Screening: Recommended annual ophthalmology exams for early detection of glaucoma and other disorders of the eye. Is the patient up to date with their annual eye exam?  No  Who is the provider or what is the name of the office in which the patient attends annual eye exams? No one If pt is not established with a provider, would they like to be referred to a provider to establish care? No .   Dental Screening: Recommended annual dental exams for proper oral hygiene  Community Resource Referral / Chronic Care Management: CRR required this visit?  No   CCM required this visit?  No      Plan:     I have personally reviewed and noted the following in the patient's chart:   Medical and social history Use of alcohol, tobacco or illicit drugs  Current medications and supplements including opioid  prescriptions. Patient is not currently taking opioid prescriptions. Functional ability and status Nutritional status Physical activity Advanced directives List of other physicians Hospitalizations, surgeries, and ER visits in previous 12 months Vitals Screenings to include cognitive, depression, and falls Referrals and appointments  In addition, I have reviewed and discussed with patient certain preventive protocols, quality metrics, and best practice recommendations. A written personalized care plan for preventive services as well as general preventive health recommendations were provided to patient.     LDionisio David LPN   [id number]  Nurse Notes: none

## 2022-05-14 ENCOUNTER — Ambulatory Visit: Payer: Medicare Other | Admitting: Internal Medicine

## 2022-05-27 ENCOUNTER — Encounter: Payer: Self-pay | Admitting: Emergency Medicine

## 2022-05-27 ENCOUNTER — Ambulatory Visit
Admission: EM | Admit: 2022-05-27 | Discharge: 2022-05-27 | Disposition: A | Discharge status: Home / Self Care | Payer: Medicare Other | Attending: Physician Assistant | Admitting: Physician Assistant

## 2022-05-27 DIAGNOSIS — R0981 Nasal congestion: Secondary | ICD-10-CM | POA: Diagnosis present

## 2022-05-27 DIAGNOSIS — J069 Acute upper respiratory infection, unspecified: Secondary | ICD-10-CM | POA: Diagnosis present

## 2022-05-27 DIAGNOSIS — R051 Acute cough: Secondary | ICD-10-CM | POA: Diagnosis present

## 2022-05-27 DIAGNOSIS — Z1152 Encounter for screening for COVID-19: Secondary | ICD-10-CM | POA: Insufficient documentation

## 2022-05-27 DIAGNOSIS — J029 Acute pharyngitis, unspecified: Secondary | ICD-10-CM | POA: Diagnosis not present

## 2022-05-27 LAB — GROUP A STREP BY PCR: Group A Strep by PCR: NOT DETECTED

## 2022-05-27 LAB — SARS CORONAVIRUS 2 BY RT PCR: SARS Coronavirus 2 by RT PCR: NEGATIVE

## 2022-05-27 MED ORDER — PROMETHAZINE-DM 6.25-15 MG/5ML PO SYRP: 5.0000 mL | ORAL_SOLUTION | Freq: Four times a day (QID) | ORAL | 0 refills | Status: DC | PRN

## 2022-05-27 NOTE — Discharge Instructions (Addendum)
URI/COLD SYMPTOMS: Your exam today is consistent with a viral illness. Antibiotics are not indicated at this time. Use medications as directed, including cough syrup, nasal saline, and decongestants. Your symptoms should improve over the next few days and resolve within 7-10 days. Increase rest and fluids. F/u if symptoms worsen or predominate such as sore throat, ear pain, productive cough, shortness of breath, or if you develop high fevers or worsening fatigue over the next several days.    -Negative COVID. If strep +, I will call and send antibiotics. If you don't hear from me, it is negative and you have a cold.

## 2022-05-27 NOTE — ED Provider Notes (Signed)
[facility]    CSN: [id number] Arrival date & time: 05/27/22  1259      History   Chief Complaint Chief Complaint  Patient presents with   Cough   Nasal Congestion    HPI Anthony Cohen is a 62 y.o. male presenting for approximately 3-day history of nasal congestion, cough, sore throat, body aches, fatigue.  Also reports reduced sense of taste.  He denies any high-grade fever.  States it was low-grade yesterday.  Temp currently 98.9 degrees.  He denies breathing difficulty wheezing, vomiting or diarrhea.  He is concerned about possible COVID.  He has not taken any medicine over-the-counter.  No other complaints.  HPI  History reviewed. No pertinent past medical history.  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   Erectile dysfunction 06/09/2020   Pinched nerve in shoulder, left 09/11/2019   Pes planus, congenital 05/26/2018   Restless leg syndrome 05/26/2018   Arthritis of knee 10/25/2016   GERD without esophagitis 04/20/2016   Type II diabetes mellitus with complication ([place]) [id number]   Hyperlipidemia associated with type 2 diabetes mellitus ([place]) 06/16/2015   OSA (obstructive sleep apnea) 03/10/2015   Recurrent major depressive disorder, in partial remission ([place]) 02/19/2015   Family history of digestive disorder 02/19/2015   Tobacco use disorder, mild, in sustained remission 02/19/2015    Past Surgical History:  Procedure Laterality Date   HIP ARTHROPLASTY Right 11/2021       Home Medications    Prior to Admission medications   Medication Sig Start Date End Date Taking? Authorizing Provider  buPROPion (WELLBUTRIN XL) 150 MG 24 hr tablet Take 3 tablets (450 mg total) by mouth daily. 03/08/22  Yes Glean Hess, MD  ezetimibe (ZETIA) 10 MG tablet TAKE ONE TABLET BY MOUTH DAILY 04/21/22  Yes Glean Hess, MD  famotidine (PEPCID) 20 MG tablet Take by mouth. 11/26/21  Yes [provider]  gabapentin (NEURONTIN) 400 MG capsule Take 1 capsule (400 mg total) by mouth 2 (two) times daily. 03/08/22  Yes Glean Hess, MD  Multiple Vitamin (MULTI-VITAMIN) tablet Take 1 tablet by mouth daily.   Yes [provider]  promethazine-dextromethorphan (PROMETHAZINE-DM) 6.25-15 MG/5ML syrup Take 5 mLs by mouth 4 (four) times daily as needed. 05/27/22  Yes Danton Clap, PA-C  venlafaxine XR (EFFEXOR-XR) 75 MG 24 hr capsule Take 3 capsules (225 mg total) by mouth daily with breakfast. 03/08/22  Yes Glean Hess, MD  Misc Natural Products (PROSTATE) CAPS Take by mouth.    [provider]    Family History Family History  Problem Relation Age of Onset   Hypertension Mother    Breast cancer Mother     Social History Social History   Tobacco Use   Smoking status: Former    Packs/day: 1.00    Years: 3.00    Total pack years: 3.00    Types: Cigarettes    Start date: 03/28/2015    Quit date: 03/29/2015    Years since quitting: 7.1   Smokeless tobacco: Never  Vaping Use   Vaping Use: Never used  Substance Use Topics   Alcohol use: Yes    Alcohol/week: 0.0 standard drinks of alcohol   Drug use: No     Allergies   Patient has no known allergies.   Review of Systems Review of Systems  Constitutional:  Positive for fatigue. Negative for fever.  HENT:  Positive for congestion, rhinorrhea and sore throat. Negative for sinus pressure and sinus pain.   Respiratory:  Positive for cough. Negative for shortness of breath.   Cardiovascular:  Negative for chest pain.  Gastrointestinal:  Negative for abdominal pain, diarrhea, nausea and vomiting.  Musculoskeletal:  Positive for myalgias.  Neurological:  Negative for weakness, light-headedness and headaches.  Hematological:  Negative for adenopathy.     Physical Exam Triage Vital Signs ED Triage Vitals  Enc Vitals  Group     BP      Pulse      Resp      Temp      Temp src      SpO2      Weight      Height      Head Circumference      Peak Flow      Pain Score      Pain Loc      Pain Edu?      Excl. in [place]?    No data found.  Updated Vital Signs BP (!) 154/94 (BP Location: Left Arm)   Pulse 87   Temp 98.9 F (37.2 C) (Oral)   Resp 18   Ht 6' (1.829 m)   Wt 255 lb 15.3 oz (116.1 kg)   SpO2 96%   BMI 34.71 kg/m      Physical Exam Vitals and nursing note reviewed.  Constitutional:      General: He is not in acute distress.    Appearance: Normal appearance. He is well-developed. He is not ill-appearing.  HENT:     Head: Normocephalic and atraumatic.     Nose: Congestion present.     Mouth/Throat:     Mouth: Mucous membranes are moist.     Pharynx: Oropharynx is clear. Posterior oropharyngeal erythema present.  Eyes:     General: No scleral icterus.    Conjunctiva/sclera: Conjunctivae normal.  Cardiovascular:     Rate and Rhythm: Normal rate and regular rhythm.     Heart sounds: Normal heart sounds.  Pulmonary:     Effort: Pulmonary effort is normal. No respiratory distress.     Breath sounds: Normal breath sounds. No wheezing, rhonchi or rales.  Musculoskeletal:     Cervical back: Neck supple.  Skin:    General: Skin is warm and dry.     Capillary Refill: Capillary refill takes less than 2 seconds.  Neurological:     General: No focal deficit present.     Mental Status: He is alert. Mental status is at baseline.     Motor: No weakness.     Gait: Gait normal.  Psychiatric:        Mood and Affect: Mood normal.        Behavior: Behavior normal.      UC Treatments / Results  Labs (all labs ordered are listed, but only abnormal results are displayed) Labs Reviewed  SARS CORONAVIRUS 2 BY RT PCR  GROUP A STREP BY PCR    EKG   Radiology No results found.  Procedures Procedures (including critical care time)  Medications Ordered in UC Medications - No data  to display  Initial Impression / Assessment and Plan / UC Course  I have reviewed the triage vital signs and the nursing notes.  Pertinent labs & imaging results that were available during my care of the patient were reviewed by me and considered in my medical  decision making (see chart for details).   62 year old male presents for approximately 3-day history of cough, congestion, sore throat, body aches and fatigue.  He is afebrile and overall well-appearing.  On exam he has nasal congestion and erythema posterior pharynx with mild swelling of the posterior pharynx.  Chest clear to auscultation.  COVID testing obtained and negative.  Strep testing obtained.  Negative.  Viral URI.  Supportive care encouraged with increasing rest and fluids.  Sent Promethazine DM to pharmacy.  Reviewed return and ER precaution.   Final Clinical Impressions(s) / UC Diagnoses   Final diagnoses:  Upper respiratory tract infection, unspecified type  Nasal congestion  Acute cough  Sore throat     Discharge Instructions      URI/COLD SYMPTOMS: Your exam today is consistent with a viral illness. Antibiotics are not indicated at this time. Use medications as directed, including cough syrup, nasal saline, and decongestants. Your symptoms should improve over the next few days and resolve within 7-10 days. Increase rest and fluids. F/u if symptoms worsen or predominate such as sore throat, ear pain, productive cough, shortness of breath, or if you develop high fevers or worsening fatigue over the next several days.    -Negative COVID. If strep +, I will call and send antibiotics. If you don't hear from me, it is negative and you have a cold.     ED Prescriptions     Medication Sig Dispense Auth. Provider   promethazine-dextromethorphan (PROMETHAZINE-DM) 6.25-15 MG/5ML syrup Take 5 mLs by mouth 4 (four) times daily as needed. 118 mL Danton Clap, PA-C      PDMP not reviewed this encounter.   Danton Clap, PA-C 05/27/22 1624

## 2022-05-27 NOTE — ED Triage Notes (Signed)
Pt c/o nasal congestion, cough, body aches. Started about 3 days ago. He states he had a low grade fever last night.

## 2022-06-18 HISTORY — PX: FOOT ARTHRODESIS, TRIPLE: SUR54

## 2022-08-24 ENCOUNTER — Encounter: Payer: Self-pay | Admitting: *Deleted

## 2022-08-24 ENCOUNTER — Ambulatory Visit
Admission: EM | Admit: 2022-08-24 | Discharge: 2022-08-24 | Disposition: A | Discharge status: Home / Self Care | Payer: Medicare Other | Attending: Emergency Medicine | Admitting: Emergency Medicine

## 2022-08-24 DIAGNOSIS — S0502XA Injury of conjunctiva and corneal abrasion without foreign body, left eye, initial encounter: Secondary | ICD-10-CM

## 2022-08-24 HISTORY — DX: Pure hypercholesterolemia, unspecified: E78.00

## 2022-08-24 MED ORDER — ERYTHROMYCIN 5 MG/GM OP OINT: TOPICAL_OINTMENT | OPHTHALMIC | 0 refills | Status: DC

## 2022-08-24 MED ORDER — FLUORESCEIN SODIUM 1 MG OP STRP
1.0000 | ORAL_STRIP | Freq: Once | OPHTHALMIC | Status: AC
  Administered 2022-08-24: 1 via OPHTHALMIC

## 2022-08-24 MED ORDER — TRAMADOL HCL 50 MG PO TABS: 50.0000 mg | ORAL_TABLET | Freq: Four times a day (QID) | ORAL | 0 refills | Status: AC | PRN

## 2022-08-24 MED ORDER — TETRACAINE HCL 0.5 % OP SOLN
2.0000 [drp] | Freq: Once | OPHTHALMIC | Status: AC
  Administered 2022-08-24: 2 [drp] via OPHTHALMIC

## 2022-08-24 NOTE — ED Triage Notes (Signed)
Pt states he woke up this morning with left eye pain.

## 2022-08-24 NOTE — ED Provider Notes (Signed)
[facility]    CSN: [id number] Arrival date & time: 08/24/22  1701      History   Chief Complaint Chief Complaint  Patient presents with   Eye Pain    HPI Anthony Cohen is a 62 y.o. male.   62 year old male, Anthony Cohen, presents to urgent care chief complaint of left eye pain.  Patient states he waking this morning left eye pain unknown cause of injury.  Per husband patient has scratched his eye before, tetanus up-to-date.  The history is provided by the patient. No language interpreter was used.    Past Medical History:  Diagnosis Date   Hypercholesteremia     Patient Active Problem List   Diagnosis Date Noted   Left corneal abrasion 08/24/2022   Primary osteoarthritis of right hip 05/20/2021   Right inguinal pain 05/04/2021   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   Erectile dysfunction 06/09/2020   Pinched nerve in shoulder, left 09/11/2019   Pes planus, congenital 05/26/2018   Restless leg syndrome 05/26/2018   Arthritis of knee 10/25/2016   GERD without esophagitis 04/20/2016   Type II diabetes mellitus with complication (HCC) 06/16/2015   Hyperlipidemia associated with type 2 diabetes mellitus (HCC) 06/16/2015   OSA (obstructive sleep apnea) 03/10/2015   Recurrent major depressive disorder, in partial remission (HCC) 02/19/2015   Family history of digestive disorder 02/19/2015   Tobacco use disorder, mild, in sustained remission 02/19/2015    Past Surgical History:  Procedure Laterality Date   HIP ARTHROPLASTY Right 11/2021       Home Medications    Prior to Admission medications   Medication Sig Start Date End Date Taking? Authorizing Provider  buPROPion (WELLBUTRIN XL) 150 MG 24 hr tablet Take 3 tablets (450 mg total) by mouth daily. 03/08/22  Yes Reubin Milan, MD  erythromycin ophthalmic ointment Place a 1/2 inch ribbon of ointment into the left lower eyelid every 6 hours x 5 days 08/24/22  Yes Jaedin Regina, Para March, NP   ezetimibe (ZETIA) 10 MG tablet TAKE ONE TABLET BY MOUTH DAILY 04/21/22  Yes Reubin Milan, MD  famotidine (PEPCID) 20 MG tablet Take by mouth. 11/26/21  Yes [provider]  gabapentin (NEURONTIN) 400 MG capsule Take 1 capsule (400 mg total) by mouth 2 (two) times daily. 03/08/22  Yes Reubin Milan, MD  Misc Natural Products (PROSTATE) CAPS Take by mouth.   Yes [provider]  Multiple Vitamin (MULTI-VITAMIN) tablet Take 1 tablet by mouth daily.   Yes [provider]  promethazine-dextromethorphan (PROMETHAZINE-DM) 6.25-15 MG/5ML syrup Take 5 mLs by mouth 4 (four) times daily as needed. 05/27/22  Yes Shirlee Latch, PA-C  traMADol (ULTRAM) 50 MG tablet Take 1 tablet (50 mg total) by mouth every 6 (six) hours as needed for up to 3 days. 08/24/22 08/27/22 Yes Khristine Verno, Para March, NP  venlafaxine XR (EFFEXOR-XR) 75 MG 24 hr capsule Take 3 capsules (225 mg total) by mouth daily with breakfast. 03/08/22  Yes Reubin Milan, MD    Family History Family History  Problem Relation Age of Onset   Hypertension Mother    Breast cancer Mother     Social History Social History   Tobacco Use   Smoking status: Former    Packs/day: 1.00    Years: 3.00    Additional pack years: 0.00    Total pack years: 3.00    Types: Cigarettes    Start date: 03/28/2015    Quit date: 03/29/2015  Years since quitting: 7.4   Smokeless tobacco: Never  Vaping Use   Vaping Use: Never used  Substance Use Topics   Alcohol use: Yes    Alcohol/week: 0.0 standard drinks of alcohol   Drug use: No     Allergies   Patient has no known allergies.   Review of Systems Review of Systems  Constitutional:  Negative for fever.  Eyes:  Positive for pain.       Eye watering  All other systems reviewed and are negative.    Physical Exam Triage Vital Signs ED Triage Vitals  Enc Vitals Group     BP 08/24/22 1745 (!) 163/108     Pulse Rate 08/24/22 1745 81     Resp 08/24/22 1745 18      Temp 08/24/22 1745 98.5 F (36.9 C)     Temp Source 08/24/22 1745 Oral     SpO2 08/24/22 1745 95 %     Weight --      Height --      Head Circumference --      Peak Flow --      Pain Score 08/24/22 1743 10     Pain Loc --      Pain Edu? --      Excl. in GC? --    No data found.  Updated Vital Signs BP (!) 163/108 (BP Location: Left Arm)   Pulse 81   Temp 98.5 F (36.9 C) (Oral)   Resp 18   SpO2 95%   Visual Acuity Right Eye Distance: 20/40 Left Eye Distance: 20/70 Bilateral Distance: 20/30  Right Eye Near:   Left Eye Near:    Bilateral Near:     Physical Exam Vitals and nursing note reviewed.  Constitutional:      General: He is not in acute distress.    Appearance: He is well-developed.  HENT:     Head: Normocephalic and atraumatic.  Eyes:     General: Lids are normal. Lids are everted, no foreign bodies appreciated. Vision grossly intact.     Conjunctiva/sclera:     Left eye: Left conjunctiva is injected.     Pupils: Pupils are equal, round, and reactive to light.     Left eye: Corneal abrasion and fluorescein uptake present.      Comments: Large corneal abrasion noted, positive fluorescein uptake as charted  Cardiovascular:     Rate and Rhythm: Normal rate and regular rhythm.     Heart sounds: No murmur heard. Pulmonary:     Effort: Pulmonary effort is normal. No respiratory distress.     Breath sounds: Normal breath sounds.  Abdominal:     Palpations: Abdomen is soft.     Tenderness: There is no abdominal tenderness.  Musculoskeletal:        General: No swelling.     Cervical back: Neck supple.  Skin:    General: Skin is warm and dry.     Capillary Refill: Capillary refill takes less than 2 seconds.  Neurological:     General: No focal deficit present.     Mental Status: He is alert and oriented to person, place, and time.     GCS: GCS eye subscore is 4. GCS verbal subscore is 5. GCS motor subscore is 6.  Psychiatric:        Attention and  Perception: Attention normal.        Mood and Affect: Mood normal.        Speech: Speech normal.  Behavior: Behavior normal.      UC Treatments / Results  Labs (all labs ordered are listed, but only abnormal results are displayed) Labs Reviewed - No data to display  EKG   Radiology No results found.  Procedures Procedures (including critical care time)  Medications Ordered in UC Medications  tetracaine (PONTOCAINE) 0.5 % ophthalmic solution 2 drop (2 drops Left Eye Given 08/24/22 1747)  fluorescein ophthalmic strip 1 strip (1 strip Left Eye Given 08/24/22 1747)    Initial Impression / Assessment and Plan / UC Course  I have reviewed the triage vital signs and the nursing notes.  Pertinent labs & imaging results that were available during my care of the patient were reviewed by me and considered in my medical decision making (see chart for details).    Patient has been both verbalized understanding to this provider, improved after tetracaine drops applied by this provider(bottle removed by this provider).   Ddx: Corneal abrasion(left) Final Clinical Impressions(s) / UC Diagnoses   Final diagnoses:  Abrasion of left cornea, initial encounter     Discharge Instructions      Use ointment as prescribed, pain med for pain. Follow up with PCP. Return to Urgent Care as needed for new or worsening issues.     ED Prescriptions     Medication Sig Dispense Auth. Provider   erythromycin ophthalmic ointment Place a 1/2 inch ribbon of ointment into the left lower eyelid every 6 hours x 5 days 3.5 g Tekisha Darcey, NP   traMADol (ULTRAM) 50 MG tablet Take 1 tablet (50 mg total) by mouth every 6 (six) hours as needed for up to 3 days. 10 tablet Ruthe Roemer, Para March, NP      I have reviewed the PDMP during this encounter.   Clancy Gourd, NP 08/24/22 2141

## 2022-08-24 NOTE — Discharge Instructions (Addendum)
Use ointment as prescribed, pain med for pain. Follow up with PCP. Return to Urgent Care as needed for new or worsening issues.

## 2022-09-07 ENCOUNTER — Encounter: Payer: Self-pay | Admitting: Internal Medicine

## 2022-09-07 ENCOUNTER — Ambulatory Visit (INDEPENDENT_AMBULATORY_CARE_PROVIDER_SITE_OTHER): Payer: Medicare Other | Admitting: Internal Medicine

## 2022-09-07 VITALS — BP 120/80 | HR 110 | Ht 72.0 in | Wt 246.0 lb

## 2022-09-07 DIAGNOSIS — E1169 Type 2 diabetes mellitus with other specified complication: Secondary | ICD-10-CM

## 2022-09-07 DIAGNOSIS — E785 Hyperlipidemia, unspecified: Secondary | ICD-10-CM | POA: Diagnosis not present

## 2022-09-07 DIAGNOSIS — F3341 Major depressive disorder, recurrent, in partial remission: Secondary | ICD-10-CM

## 2022-09-07 DIAGNOSIS — E118 Type 2 diabetes mellitus with unspecified complications: Secondary | ICD-10-CM | POA: Diagnosis not present

## 2022-09-07 MED ORDER — EZETIMIBE 10 MG PO TABS: 10.0000 mg | ORAL_TABLET | Freq: Every day | ORAL | 1 refills | Status: DC

## 2022-09-07 MED ORDER — BUPROPION HCL ER (XL) 150 MG PO TB24: 450.0000 mg | ORAL_TABLET | Freq: Every day | ORAL | 1 refills | Status: DC

## 2022-09-07 NOTE — Assessment & Plan Note (Signed)
Continue diet and weight control

## 2022-09-07 NOTE — Assessment & Plan Note (Signed)
Tolerating non-statin medications - Zetia.  No side effects noted. LDL is  Lab Results  Component Value Date   LDLCALC 110 (H) 11/05/2021

## 2022-09-07 NOTE — Patient Instructions (Signed)
[facility] - for routine and DM eye check

## 2022-09-07 NOTE — Progress Notes (Signed)
Date:  09/07/2022   Name:  Anthony Cohen   DOB:  [birth year]   MRN:  [id number]   Chief Complaint: Hypertension  Diabetes He presents for his follow-up diabetic visit. He has type 2 diabetes mellitus. His disease course has been stable. Pertinent negatives for hypoglycemia include no dizziness, headaches or nervousness/anxiousness. Pertinent negatives for diabetes include no chest pain and no fatigue. Current diabetic treatment includes diet. He is compliant with treatment most of the time. His weight is stable.  Hyperlipidemia This is a chronic problem. Recent lipid tests were reviewed and are variable. Associated symptoms include shortness of breath (with exertion since ankle surgery). Pertinent negatives include no chest pain. Current antihyperlipidemic treatment includes ezetimibe. The current treatment provides moderate improvement of lipids.  Depression        This is a chronic problem.The problem is unchanged.  Associated symptoms include no fatigue and no headaches.  Past treatments include SNRIs - Serotonin and norepinephrine reuptake inhibitors and other medications.  Compliance with treatment is good.  Previous treatment provided significant relief.   Lab Results  Component Value Date   NA 139 03/08/2022   K 4.4 03/08/2022   CO2 23 03/08/2022   GLUCOSE 108 (H) 03/08/2022   BUN 25 03/08/2022   CREATININE 0.82 03/08/2022   CALCIUM 9.2 03/08/2022   EGFR 100 03/08/2022   GFRNONAA >60 09/21/2019   Lab Results  Component Value Date   CHOL 221 (H) 11/05/2021   HDL 68 11/05/2021   LDLCALC 110 (H) 11/05/2021   TRIG 254 (H) 11/05/2021   CHOLHDL 3.3 11/05/2021   Lab Results  Component Value Date   TSH 1.810 11/05/2021   Lab Results  Component Value Date   HGBA1C 6.2 (H) 03/08/2022   Lab Results  Component Value Date   WBC 3.9 05/05/2022   HGB 14.3 05/05/2022   HCT 42.7 05/05/2022   MCV 96 05/05/2022   PLT 204 05/05/2022   Lab Results  Component Value Date    ALT 47 (H) 03/08/2022   AST 34 03/08/2022   ALKPHOS 64 03/08/2022   BILITOT 0.2 03/08/2022   No results found for: "25OHVITD2", "25OHVITD3", "VD25OH"   Review of Systems  Constitutional:  Negative for chills, fatigue and fever.  HENT:  Negative for trouble swallowing.   Respiratory:  Positive for shortness of breath (with exertion since ankle surgery). Negative for chest tightness and wheezing.   Cardiovascular:  Negative for chest pain.  Gastrointestinal:  Negative for abdominal pain.  Genitourinary:  Positive for frequency.  Musculoskeletal:  Positive for arthralgias and gait problem (left ankle fusion).  Neurological:  Negative for dizziness, light-headedness and headaches.  Psychiatric/Behavioral:  Positive for depression. Negative for dysphoric mood and sleep disturbance. The patient is not nervous/anxious.     Patient Active Problem List   Diagnosis Date Noted   Left corneal abrasion 08/24/2022   Primary osteoarthritis of right hip 05/20/2021   Right inguinal pain 05/04/2021   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   Erectile dysfunction 06/09/2020   Pinched nerve in shoulder, left 09/11/2019   Pes planus, congenital 05/26/2018   Restless leg syndrome 05/26/2018   Arthritis of knee 10/25/2016   GERD without esophagitis 04/20/2016   Type II diabetes mellitus with complication (HCC) 06/16/2015   Hyperlipidemia associated with type 2 diabetes mellitus (HCC) 06/16/2015   OSA (obstructive sleep apnea) 03/10/2015   Recurrent major depressive disorder, in partial remission (HCC) 02/19/2015   Family history of digestive disorder 02/19/2015  Tobacco use disorder, mild, in sustained remission 02/19/2015    No Known Allergies  Past Surgical History:  Procedure Laterality Date   FOOT ARTHRODESIS, TRIPLE Left 06/18/2022   HIP ARTHROPLASTY Right 11/2021    Social History   Tobacco Use   Smoking status: Former    Packs/day: 1.00    Years: 3.00    Additional pack  years: 0.00    Total pack years: 3.00    Types: Cigarettes    Start date: 03/28/2015    Quit date: 03/29/2015    Years since quitting: 7.4   Smokeless tobacco: Never  Vaping Use   Vaping Use: Never used  Substance Use Topics   Alcohol use: Yes    Alcohol/week: 0.0 standard drinks of alcohol   Drug use: No     Medication list has been reviewed and updated.  Current Meds  Medication Sig   famotidine (PEPCID) 20 MG tablet Take by mouth.   gabapentin (NEURONTIN) 400 MG capsule Take 1 capsule (400 mg total) by mouth 2 (two) times daily.   Misc Natural Products (PROSTATE) CAPS Take by mouth.   Multiple Vitamin (MULTI-VITAMIN) tablet Take 1 tablet by mouth daily.   venlafaxine XR (EFFEXOR-XR) 75 MG 24 hr capsule Take 3 capsules (225 mg total) by mouth daily with breakfast.   [DISCONTINUED] buPROPion (WELLBUTRIN XL) 150 MG 24 hr tablet Take 3 tablets (450 mg total) by mouth daily.   [DISCONTINUED] erythromycin ophthalmic ointment Place a 1/2 inch ribbon of ointment into the left lower eyelid every 6 hours x 5 days   [DISCONTINUED] ezetimibe (ZETIA) 10 MG tablet TAKE ONE TABLET BY MOUTH DAILY       09/07/2022   10:53 AM 05/05/2022    9:49 AM 03/08/2022   11:02 AM 11/05/2021   10:30 AM  GAD 7 : Generalized Anxiety Score  Nervous, Anxious, on Edge 0 0 0 1  Control/stop worrying 0 0 0 1  Worry too much - different things 0 0 0 1  Trouble relaxing 0 0 0 1  Restless 0 0 0 0  Easily annoyed or irritable 0 0 0 2  Afraid - awful might happen 0 0 0 0  Total GAD 7 Score 0 0 0 6  Anxiety Difficulty Not difficult at all Not difficult at all Not difficult at all Somewhat difficult       09/07/2022   10:53 AM 05/06/2022   10:09 AM 05/05/2022    9:49 AM  Depression screen PHQ 2/9  Decreased Interest 0 0 0  Down, Depressed, Hopeless 0 0 0  PHQ - 2 Score 0 0 0  Altered sleeping 0 0 0  Tired, decreased energy 2 0 1  Change in appetite 0 0 0  Feeling bad or failure about yourself  0 0 0   Trouble concentrating 0 0 0  Moving slowly or fidgety/restless 0 0 0  Suicidal thoughts 0 0 0  PHQ-9 Score 2 0 1  Difficult doing work/chores Not difficult at all Not difficult at all Not difficult at all    BP Readings from Last 3 Encounters:  09/07/22 120/80  08/24/22 (!) 163/108  05/27/22 (!) 154/94    Physical Exam Vitals and nursing note reviewed.  Constitutional:      General: He is not in acute distress.    Appearance: Normal appearance. He is well-developed.  HENT:     Head: Normocephalic and atraumatic.  Cardiovascular:     Rate and Rhythm: Normal rate and regular rhythm.  Pulmonary:     Effort: Pulmonary effort is normal. No respiratory distress.     Breath sounds: No wheezing or rhonchi.  Musculoskeletal:     Cervical back: Normal range of motion.     Right lower leg: No edema.     Comments: Left foot/ankle in surgical boot  Lymphadenopathy:     Cervical: No cervical adenopathy.  Skin:    General: Skin is warm and dry.     Findings: No rash.  Neurological:     General: No focal deficit present.     Mental Status: He is alert and oriented to person, place, and time.  Psychiatric:        Mood and Affect: Mood normal.        Behavior: Behavior normal.     Wt Readings from Last 3 Encounters:  09/07/22 246 lb (111.6 kg)  05/27/22 255 lb 15.3 oz (116.1 kg)  05/06/22 256 lb (116.1 kg)    BP 120/80   Pulse (!) 110   Ht 6' (1.829 m)   Wt 246 lb (111.6 kg)   SpO2 98%   BMI 33.36 kg/m   Assessment and Plan:  Problem List Items Addressed This Visit     Type II diabetes mellitus with complication (HCC) - Primary (Chronic)    Continue diet and weight control      Relevant Orders   Basic metabolic panel   Hemoglobin A1c   Microalbumin / creatinine urine ratio   Recurrent major depressive disorder, in partial remission (HCC) (Chronic)    Clinically stable on current regimen with good control of symptoms, No SI or HI. No change in management at this  time. Continue Effexor and bupropion.      Relevant Medications   buPROPion (WELLBUTRIN XL) 150 MG 24 hr tablet   Hyperlipidemia associated with type 2 diabetes mellitus (HCC) (Chronic)    Tolerating non-statin medications - Zetia.  No side effects noted. LDL is  Lab Results  Component Value Date   LDLCALC 110 (H) 11/05/2021        Relevant Medications   ezetimibe (ZETIA) 10 MG tablet   Other Relevant Orders   Lipid panel    Return in about 6 months (around 03/09/2023) for DM, Depression.   Partially dictated using Dragon software, any errors are not intentional.  Reubin Milan, MD [facility] [place], [place]

## 2022-09-07 NOTE — Assessment & Plan Note (Signed)
Clinically stable on current regimen with good control of symptoms, No SI or HI. No change in management at this time. Continue Effexor and bupropion.

## 2022-09-08 LAB — BASIC METABOLIC PANEL
BUN/Creatinine Ratio: 20 (ref 10–24)
BUN: 19 mg/dL (ref 8–27)
CO2: 26 mmol/L (ref 20–29)
Calcium: 9.7 mg/dL (ref 8.6–10.2)
Chloride: 100 mmol/L (ref 96–106)
Creatinine, Ser: 0.93 mg/dL (ref 0.76–1.27)
Glucose: 107 mg/dL — ABNORMAL HIGH (ref 70–99)
Potassium: 4.6 mmol/L (ref 3.5–5.2)
Sodium: 140 mmol/L (ref 134–144)
eGFR: 93 mL/min/{1.73_m2} (ref >59)

## 2022-09-08 LAB — LIPID PANEL
Chol/HDL Ratio: 3.3 ratio (ref 0.0–5.0)
Cholesterol, Total: 225 mg/dL — ABNORMAL HIGH (ref 100–199)
HDL: 68 mg/dL (ref >39)
LDL Chol Calc (NIH): 118 mg/dL — ABNORMAL HIGH (ref 0–99)
Triglycerides: 227 mg/dL — ABNORMAL HIGH (ref 0–149)
VLDL Cholesterol Cal: 39 mg/dL (ref 5–40)

## 2022-09-08 LAB — MICROALBUMIN / CREATININE URINE RATIO
Creatinine, Urine: 398.3 mg/dL
Microalb/Creat Ratio: 30 mg/g creat — ABNORMAL HIGH (ref 0–29)
Microalbumin, Urine: 121.2 ug/mL

## 2022-09-08 LAB — HEMOGLOBIN A1C
Est. average glucose Bld gHb Est-mCnc: 126 mg/dL
Hgb A1c MFr Bld: 6 % — ABNORMAL HIGH (ref 4.8–5.6)

## 2022-10-12 ENCOUNTER — Ambulatory Visit: Payer: Medicare Other

## 2022-10-12 DIAGNOSIS — M6281 Muscle weakness (generalized): Secondary | ICD-10-CM | POA: Insufficient documentation

## 2022-10-12 NOTE — Therapy (Deleted)
OUTPATIENT PHYSICAL THERAPY ANKLE EVALUATION   Patient Name: Anthony Cohen MRN: [id number] DOB:Apr 19, 1960, 62 y.o., male Today's Date: 10/12/2022  END OF SESSION:   Past Medical History:  Diagnosis Date   Hypercholesteremia    Past Surgical History:  Procedure Laterality Date   FOOT ARTHRODESIS, TRIPLE Left 06/18/2022   HIP ARTHROPLASTY Right 11/2021   Patient Active Problem List   Diagnosis Date Noted   Left corneal abrasion 08/24/2022   Primary osteoarthritis of right hip 05/20/2021   Right inguinal pain 05/04/2021   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   Erectile dysfunction 06/09/2020   Pinched nerve in shoulder, left 09/11/2019   Pes planus, congenital 05/26/2018   Restless leg syndrome 05/26/2018   Arthritis of knee 10/25/2016   GERD without esophagitis 04/20/2016   Type II diabetes mellitus with complication (HCC) 06/16/2015   Hyperlipidemia associated with type 2 diabetes mellitus (HCC) 06/16/2015   OSA (obstructive sleep apnea) 03/10/2015   Recurrent major depressive disorder, in partial remission (HCC) 02/19/2015   Family history of digestive disorder 02/19/2015   Tobacco use disorder, mild, in sustained remission 02/19/2015    PCP: ***  REFERRING PROVIDER: ***  REFERRING DIAG: Z98.890 (ICD-10-CM) - Hx of foot surgery   Rationale for Evaluation and Treatment: {HABREHAB:27488}  THERAPY DIAG: No diagnosis found.  ONSET DATE: ***  FOLLOW-UP APPT SCHEDULED WITH REFERRING PROVIDER: {yes/no:20286}    SUBJECTIVE:                                                                                                                                                                                         SUBJECTIVE STATEMENT:  ***  PERTINENT HISTORY:  Anthony Cohen is a pleasant 62 year old male who underwent left foot triple realignment arthrodesis, TAL, proximal tibia autograft for progressive collapsing flatfoot deformity on 06/18/2022. He required take back  to the OR same day for hardware exchange of the inferior left subtalar screw due to malposition. Admitted overnight for observation. At time of discharge pain well controlled, tolerating diet, antibiotics completed. Denies any fever/chills, chest pain, SOB, N/V/D. Does note some mild burning when urinating, no difficulty urinating, likely iatrogenic in nature due to in and out cath intra-op.   Anthony Cohen is a pleasant 62 year old male here today for worsening bilateral flatfeet, worse on the left. Notes history of flatfeet most of his life. Almost underwent correction when he was younger. Given bilateral subfibular injections on 01/27/2022 and noted approximately 2 weeks of relief with these. He was wearing the supinator ASO which helped some with his symptoms. Unable to tolerate the boot due to irritation along his foot/ankle medially. Currently complains  of left medial ankle and hindfoot pain that radiates around the back of his ankle and heel region. Denies any history of diabetes. Former smoker but quit 8 year ago. He is on disability due to his feet. Unable to be active due to his flatfeet. Underwent R THA by Dr. Wayne Sever on 11/26/21, doing well from that. Had MRI left ankle/hindfoot recently. Recent subtalar injection helped significantly for approximately 2 weeks. He did not move forward with the protect 3D brace due to cost and being unsure that this would help alleviate his symptoms. Still with pain and difficulty walking, has exhausted conservative measures. Feels ready to commit to surgery and the prolonged recovery after discussing things with his family.    PAIN:    Pain Intensity: Present: /10, Best: /10, Worst: /10 Pain location: *** Pain Quality: {PAIN DESCRIPTION:21022940}  Radiating: {yes/no:20286}  Swelling: {yes/no:20286}  Popping, catching, locking: {yes/no:20286}  Numbness/Tingling: {yes/no:20286} Focal Weakness: {yes/no:20286} Aggravating factors: *** Relieving factors: *** 24-hour  pain behavior: *** History of prior back, hip, knee, or ankle injury, pain, surgery, or therapy: {yes/no:20286} Dominant hand: {RIGHT/LEFT:20294} Imaging: {yes/no:20286}  Typical footwear:  Red flags: Negative for personal history of cancer, chills/fever, night sweats, nausea, vomiting, unrelenting pain): Negative  PRECAUTIONS: {Therapy precautions:24002}  WEIGHT BEARING RESTRICTIONS: {Yes ***/No:24003}  FALLS: Has patient fallen in last 6 months? {fallsyesno:27318}  Living Environment Lives with: {[facility] lives with:25569::"lives with their family"} Lives in: {Lives in:25570} Stairs: {opstairs:27293} Has following equipment at home: {Assistive devices:23999}  Prior level of function: {PLOF:24004}  Occupational demands:   Hobbies:   Patient Goals: ***    OBJECTIVE:   Patient Surveys  {rehab surveys:24030}  Cognition Patient is oriented to person, place, and time.  Recent memory is intact.  Remote memory is intact.  Attention span and concentration are intact.  Expressive speech is intact.  Patient's fund of knowledge is within normal limits for educational level.    Gross Musculoskeletal Assessment Bulk: Normal Tone: Normal No trophic changes noted to foot/ankle. No ecchymosis, erythema, or edema noted. No gross ankle/foot deformity noted  GAIT: Distance walked: *** Assistive device utilized: {Assistive devices:23999} Level of assistance: {Levels of assistance:24026} Comments: No excessive pronation/supination noted during gait. No excessive shoe wear noted on medial or lateral surfaces  Posture:  AROM AROM (Normal range in degrees) AROM   Lumbar   Flexion (65)   Extension (30)   Right lateral flexion (25)   Left lateral flexion (25)   Right rotation (30)   Left rotation (30)       Hip Right Left  Flexion (125)    Extension (15)    Abduction (40)    Adduction     Internal Rotation (45)    External Rotation (45)        Knee    Flexion (135)     Extension (0)        Ankle    Dorsiflexion (20)    Plantarflexion (50)    Inversion (35)    Eversion (15)    (* = pain; Blank rows = not tested)   LE MMT: MMT (out of 5) Right  Left   Hip flexion    Hip extension    Hip abduction    Hip adduction    Hip internal rotation    Hip external rotation    Knee flexion    Knee extension    Ankle dorsiflexion    Ankle plantarflexion    Ankle inversion    Ankle eversion    (* =  pain; Blank rows = not tested)  Sensation Grossly intact to light touch throughout bilateral LEs as determined by testing dermatomes L2-S2. Proprioception, stereognosis, and hot/cold testing deferred on this date.  Reflexes R/L Knee Jerk (L3/4): 2+/2+  Ankle Jerk (S1/2): 2+/2+   Muscle Length Hamstrings: R: {NEGATIVE/POSITIVE ZOX:09604} L: {NEGATIVE/POSITIVE VWU:98119} Quadriceps Michela Pitcher): R: {NEGATIVE/POSITIVE JYN:82956} L: {NEGATIVE/POSITIVE OZH:08657} Hip flexors Maisie Fus): R: {NEGATIVE/POSITIVE QIO:96295} L: {NEGATIVE/POSITIVE MWU:13244} IT band Claiborne Rigg): R: {NEGATIVE/POSITIVE WNU:27253} L: {NEGATIVE/POSITIVE GUY:40347}  Palpation Location LEFT  RIGHT           Adductor Tubercle    Pes Anserine tendon    Infrapatellar fat pad    Fibular head    Popliteal fossa    (Blank rows = not tested) Graded on 0-4 scale (0 = no pain, 1 = pain, 2 = pain with wincing/grimacing/flinching, 3 = pain with withdrawal, 4 = unwilling to allow palpation), (Blank rows = not tested)  Passive Accessory Motion Superior Tibiofibular Joint: WNL Inferior Tibiofibular Joint: WNL Talocrural Joint Distraction: WNL Talocrural Joint AP: WNL Talocrural Joint PA: WNL  VASCULAR Dorsalis pedis and posterior tibial pulses are palpable  SPECIAL TESTS Ligamentous Integrity Anterior Drawer (ATF, 10-15 plantarflexion with anterior translation): {NEGATIVE/POSITIVE QQV:95638} Talar Tilt (CFL, inversion): {NEGATIVE/POSITIVE VFI:43329} Eversion Stress Test (Deltoid, eversion):  {NEGATIVE/POSITIVE JJO:84166} External Rotation Test (High ankle, dorsiflexion and external rotation): {NEGATIVE/POSITIVE AYT:01601} Squeeze Test (High ankle): {NEGATIVE/POSITIVE UXN:23557} Impingment Sign (Dorsiflexion and eversion): {NEGATIVE/POSITIVE DUK:02542}  Achilles Integrity Thompson Test: {NEGATIVE/POSITIVE HCW:23762}  Fracture Screening Metatarsal Axial Loading: {NEGATIVE/POSITIVE GBT:51761} Tap/Percussion Test: {NEGATIVE/POSITIVE YWV:37106} Vibration Test: {NEGATIVE/POSITIVE YIR:48546}  Pronation/Supination Navicular Drop: {NEGATIVE/POSITIVE EVO:35009}  Nerve Test Tarsal Tunnel Test (maximal DF, EV, toe ext with tapping over tarsal tunnel): {NEGATIVE/POSITIVE FGH:82993} Test for Morton's Neuroma (compress metatarsals and mobilize): {NEGATIVE/POSITIVE ZJI:96789}  Other Windlass Mechanism Test: {NEGATIVE/POSITIVE FYB:01751}   Beighton Scale  LEFT  RIGHT           1. Passive dorsiflexion and hyperextension of the fifth MCP joint beyond 90  0 0   2. Passive apposition of the thumb to the flexor aspect of the forearm  0  0   3. Passive hyperextension of the elbow beyond 10  0  0   4. Passive hyperextension of the knee beyond 10  0  0   5. Active forward flexion of the trunk with the knees fully extended so that the palms of the hands rest flat on the floor   0   TOTAL         0/ 9     TODAY'S TREATMENT: DATE: ***     PATIENT EDUCATION:  Education details: *** Person educated: {Person educated:25204} Education method: {Education Method:25205} Education comprehension: {Education Comprehension:25206}   HOME EXERCISE PROGRAM:    ASSESSMENT:  CLINICAL IMPRESSION: Patient is a *** y.o. *** who was seen today for physical therapy evaluation and treatment for ***.   OBJECTIVE IMPAIRMENTS: {opptimpairments:25111}.   ACTIVITY LIMITATIONS: {activitylimitations:27494}  PARTICIPATION LIMITATIONS: {participationrestrictions:25113}  PERSONAL FACTORS: {Personal  factors:25162} are also affecting patient's functional outcome.   REHAB POTENTIAL: {rehabpotential:25112}  CLINICAL DECISION MAKING: {clinical decision making:25114}  EVALUATION COMPLEXITY: {Evaluation complexity:25115}   GOALS: Goals reviewed with patient? {yes/no:20286}  SHORT TERM GOALS: Target date: {follow up:25551}  Pt will be independent with HEP to improve strength and decrease ankle pain to improve pain-free function at home and work. Baseline: *** Goal status: INITIAL   LONG TERM GOALS: Target date: {follow up:25551}  Pt will increase FOTO to at least *** to demonstrate significant  improvement in function at home and work related to ankle pain  Baseline:  Goal status: INITIAL  2.  Pt will decrease worst ankle pain by at least 3 points on the NPRS in order to demonstrate clinically significant reduction in ankle pain. Baseline: *** Goal status: INITIAL  3.  Pt will decrease LEFS score by at least 9 points in order demonstrate clinically significant reduction in ankle pain/disability.       Baseline: *** Goal status: INITIAL  4.  Pt will increase strength of *** by at least 1/2 MMT grade in order to demonstrate improvement in strength and function  Baseline: *** Goal status: INITIAL   PLAN: PT FREQUENCY: 1-2x/week  PT DURATION: {rehab duration:25117}  PLANNED INTERVENTIONS: Therapeutic exercises, Therapeutic activity, Neuromuscular re-education, Balance training, Gait training, Patient/Family education, Self Care, Joint mobilization, Joint manipulation, Vestibular training, Canalith repositioning, Orthotic/Fit training, DME instructions, Dry Needling, Electrical stimulation, Spinal manipulation, Spinal mobilization, Cryotherapy, Moist heat, Taping, Traction, Ultrasound, Ionotophoresis 4mg /ml Dexamethasone, Manual therapy, and Re-evaluation.  PLAN FOR NEXT SESSION: ***   Sharalyn Ink  PT, DPT, GCS  ,, PT 10/12/2022, 8:43 AM

## 2022-10-19 NOTE — Therapy (Unsigned)
OUTPATIENT PHYSICAL THERAPY ANKLE EVALUATION  THIS NOTE IS INCOMPLETE, PLEASE DO NOT REFERENCE FOR INFORMATION    Patient Name: ADGER OSLUND MRN: [id number] DOB:03-04-1961, 62 y.o., male Today's Date: 10/20/2022  END OF SESSION:  PT End of Session - 10/20/22 1357     Visit Number 1    Number of Visits 25    Date for PT Re-Evaluation 01/12/23    Authorization Type eval: 10/20/22    PT Start Time 1400    PT Stop Time 1445    PT Time Calculation (min) 45 min    Activity Tolerance Patient tolerated treatment well    Behavior During Therapy [facility] for tasks assessed/performed            Past Medical History:  Diagnosis Date   Hypercholesteremia    Past Surgical History:  Procedure Laterality Date   FOOT ARTHRODESIS, TRIPLE Left 06/18/2022   HIP ARTHROPLASTY Right 11/2021   Patient Active Problem List   Diagnosis Date Noted   Left corneal abrasion 08/24/2022   Primary osteoarthritis of right hip 05/20/2021   Right inguinal pain 05/04/2021   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   Erectile dysfunction 06/09/2020   Pinched nerve in shoulder, left 09/11/2019   Pes planus, congenital 05/26/2018   Restless leg syndrome 05/26/2018   Arthritis of knee 10/25/2016   GERD without esophagitis 04/20/2016   Type II diabetes mellitus with complication (HCC) 06/16/2015   Hyperlipidemia associated with type 2 diabetes mellitus (HCC) 06/16/2015   OSA (obstructive sleep apnea) 03/10/2015   Recurrent major depressive disorder, in partial remission (HCC) 02/19/2015   Family history of digestive disorder 02/19/2015   Tobacco use disorder, mild, in sustained remission 02/19/2015    PCP: Bari Edward MD  REFERRING PROVIDER: Romeo Rabon MD  REFERRING DIAG: [phone] (ICD-10-CM) - Hx of foot surgery   Rationale for Evaluation and Treatment: Rehabilitation  THERAPY DIAG: Muscle weakness (generalized)  ONSET DATE: 06/18/22  FOLLOW-UP APPT SCHEDULED WITH REFERRING PROVIDER: Yes     SUBJECTIVE:                                                                                                                                                                                         SUBJECTIVE STATEMENT:  s/p L foot triple realignment arthrodesis, TAL, proximal tibia autograft for progressive collapsing flatfoot deformity on 06/18/2022.  PERTINENT HISTORY:  Club foot since childhood. Larey Seat since the surgery and broke the end of his top screw. Fall middle May (approximately 6 weeks after surgery). Stopped wearing his boot approximately 5 weeks ago. As soon as he came out of the boot he had a lot of  pain. Shortened achilles. 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 Lives in [place] and takes care of 2 dogs. 1 flight up to stairs. No difficulty with steps. Swelling in L ankle with activity. 7w NWB, boot for 4-5w. R THR. Custom orthotic in LLE, heel cushion on the L side. Popping in the L hip. Bilateral knee injections;   Gowtham is a pleasant 62 year old male who underwent left foot triple realignment arthrodesis, TAL, proximal tibia autograft for progressive collapsing flatfoot deformity on 06/18/2022. He required take back to the OR same day for hardware exchange of the inferior left subtalar screw due to malposition. Admitted overnight for observation. At time of discharge pain well controlled, tolerating diet, antibiotics completed. Denies any fever/chills, chest pain, SOB, N/V/D. Does note some mild burning when urinating, no difficulty urinating, likely iatrogenic in nature due to in and out cath intra-op.   Myra is a pleasant 62 year old male here today for worsening bilateral flatfeet, worse on the left. Notes history of flatfeet most of his life. Almost underwent correction when he was younger. Given bilateral subfibular injections on 01/27/2022 and noted approximately 2 weeks of relief with these. He was wearing the supinator ASO which helped some with his symptoms. Unable to tolerate  the boot due to irritation along his foot/ankle medially. Currently complains of left medial ankle and hindfoot pain that radiates around the back of his ankle and heel region. Denies any history of diabetes. Former smoker but quit 8 year ago. He is on disability due to his feet. Unable to be active due to his flatfeet. Underwent R THA by Dr. Wayne Sever on 11/26/21, doing well from that. Had MRI left ankle/hindfoot recently. Recent subtalar injection helped significantly for approximately 2 weeks. He did not move forward with the protect 3D brace due to cost and being unsure that this would help alleviate his symptoms. Still with pain and difficulty walking, has exhausted conservative measures. Feels ready to commit to surgery and the prolonged recovery after discussing things with his family.   PAIN:    Pain Intensity: Present: 2/10, Best: 0/10, Worst: 9/10 Pain location: Medial and lateral foot pain Pain Quality: achy on medial side of foot and sharp on lateral side;  Radiating: Yes, radiates down the foot, never up the leg Swelling: Yes, with activity Popping, catching, locking: Yes, "it cracks every now and then which feels good."  Numbness/Tingling: Yes, numbness across the top and lateral aspect since the surgery; Focal Weakness: No Aggravating factors: working around the house, extended standing, extended walking; Relieving factors: laying down, ice,  24-hour pain behavior: Better in the AM, worsens with activity; History of prior back, hip, knee, or ankle injury, pain, surgery, or therapy: Yes, R THR, chronic L hip pain, chronic bilateral knee pain s/p injections Dominant hand: right Imaging: Yes, imaging since the recent fall  Typical footwear: Sneakers Red flags: Negative for personal history of cancer, chills/fever, night sweats, nausea, vomiting, unrelenting pain): Negative  PRECAUTIONS: None  WEIGHT BEARING RESTRICTIONS: No  FALLS: Has patient fallen in last 6 months? Yes. Number of  falls 1  Living Environment Lives with: lives with their spouse Lives in: House/apartment Stairs: 1 flight of stairs inside, bedroom on second level; Has following equipment at home: Wheelchair, knee scooter, crutches. Doesn't use currently.   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  Recent memory is intact.  Remote memory is intact.  Attention span and concentration are intact.  Expressive speech is intact.  Patient's fund of knowledge is within normal limits for educational level.    Gross Musculoskeletal Assessment Healed and closed surgical incisions. Bruising on the dorsal aspect of L foot. Otherwise no gross edema, erythema, or ecchymosis noted in foot.   GAIT: Distance walked: 100' Assistive device utilized: None Level of assistance: Complete Independence Comments: Bilateral external rotation, significant navicular drop and pronation noted. (R > L)  Posture: No gross postural issues related to patient's referral   AROM AROM (Normal range in degrees) AROM       Hip Right Left      Knee    Flexion (135) WNL WNL  Extension (0) WNL WNL      Ankle    Dorsiflexion (20) 32 2  Plantarflexion (50) 50 26  Inversion (35) 26 8  Eversion (15) 16 10  (* = pain; Blank rows = not tested)   LE MMT: MMT (out of 5) Right  Left   Hip flexion 5 5  Hip extension    Hip abduction    Hip adduction    Hip internal rotation 5 5  Hip external rotation 5 5  Knee flexion (seated) 5 5  Knee extension 5 5  Ankle dorsiflexion 5 5  Ankle plantarflexion strong strong  Ankle inversion 5 4  Ankle eversion 5 5  (* = pain; Blank rows = not tested)  Sensation Deferred  Palpation No gross pain noted throughout foot and  ankle. Minor tenderness noted across talocrural joint line.  Passive Accessory Motion Deferred  VASCULAR Unable to palpate dorsalis pedis and posterior tibial pulses are palpable    TODAY'S TREATMENT: Deferred   PATIENT EDUCATION:  Education details: plan of care and HEP Person educated: Patient Education method: Explanation, Demonstration, Verbal cues, and Handouts Education comprehension: verbalized understanding   HOME EXERCISE PROGRAM:  Access Code: [email or web address] URL: [email or web address] Date: 10/20/2022 Prepared by: Ria Comment  Exercises - Seated Ankle Alphabet (Mirrored)  - 2 x daily - 7 x weekly - 2 reps - Seated Ankle Circles  - 2 x daily - 7 x weekly - 2 sets - 10 reps - Seated Toe Towel Scrunches  - 2 x daily - 7 x weekly - 2 reps - 1-2 minutes hold  ASSESSMENT:  CLINICAL IMPRESSION: Patient is a 62 y.o. male who was seen today for physical therapy evaluation and treatment s/p L ankle surgery   OBJECTIVE IMPAIRMENTS: Abnormal gait, difficulty walking, decreased ROM, decreased strength, and pain.   ACTIVITY LIMITATIONS: standing  PARTICIPATION LIMITATIONS: cleaning, shopping, community activity, and occupation  PERSONAL FACTORS: Past/current experiences, Time since onset of injury/illness/exacerbation, and 3+ comorbidities: depression, knee OA, DM, and pes planus  are also affecting patient's functional outcome.   REHAB POTENTIAL: Good  CLINICAL DECISION MAKING: Unstable/unpredictable  EVALUATION COMPLEXITY: High   GOALS: Goals reviewed with patient? No  SHORT TERM GOALS: Target date: 12/01/2022  Pt will be independent with HEP to improve strength and decrease ankle pain to improve pain-free function at home and work. Baseline:  Goal status: INITIAL   LONG TERM GOALS: Target date: 01/12/2023  Pt will increase FOTO to at least  to demonstrate significant improvement in function at home and work related to ankle pain  Baseline:   Goal status: INITIAL  2.  Pt  will decrease worst ankle pain by at least 3 points on the NPRS in order to demonstrate clinically significant reduction in ankle pain. Baseline: 10/20/22: worst: 9/10; Goal status: INITIAL  4.  Pt will increase strength of L ankle inversion by at least 1/2 MMT grade in order to demonstrate improvement in strength and function  Baseline: 10/20/22: 4/5; Goal status: INITIAL   PLAN: PT FREQUENCY: 1-2x/week  PT DURATION: 12 weeks  PLANNED INTERVENTIONS: Therapeutic exercises, Therapeutic activity, Neuromuscular re-education, Balance training, Gait training, Patient/Family education, Self Care, Joint mobilization, Joint manipulation, Vestibular training, Canalith repositioning, Orthotic/Fit training, DME instructions, Dry Needling, Electrical stimulation, Spinal manipulation, Spinal mobilization, Cryotherapy, Moist heat, Taping, Traction, Ultrasound, Ionotophoresis 4mg /ml Dexamethasone, Manual therapy, and Re-evaluation.  PLAN FOR NEXT SESSION: sensation testing, passive accessory talocrural and fibula on tibia, progress strengthening, and review/modify HEP as needed;   Sharalyn Ink Deissy Guilbert PT, DPT, GCS  Jaylun Fleener, PT 10/20/2022, 5:53 PM

## 2022-10-20 ENCOUNTER — Ambulatory Visit: Payer: Medicare Other

## 2022-10-20 DIAGNOSIS — M6281 Muscle weakness (generalized): Secondary | ICD-10-CM

## 2022-10-27 ENCOUNTER — Ambulatory Visit: Payer: Medicare Other | Attending: Orthopedic Surgery

## 2022-10-27 VITALS — BP 140/84 | HR 96

## 2022-10-27 DIAGNOSIS — M6281 Muscle weakness (generalized): Secondary | ICD-10-CM | POA: Insufficient documentation

## 2022-10-27 NOTE — Therapy (Cosign Needed)
OUTPATIENT PHYSICAL THERAPY ANKLE TREATMENT  Patient Name: Anthony Cohen MRN: [id number] DOB:1960/06/21, 62 y.o., male Today's Date: 10/28/2022   PT End of Session - 10/28/22 1246     Visit Number 2    Number of Visits 25    Date for PT Re-Evaluation 01/12/23    Authorization Type eval: 10/20/22    PT Start Time 1405    PT Stop Time 1450    PT Time Calculation (min) 45 min    Activity Tolerance Treatment limited secondary to medical complications (Comment)   vomiting   Behavior During Therapy [facility] for tasks assessed/performed             Past Medical History:  Diagnosis Date   Hypercholesteremia    Past Surgical History:  Procedure Laterality Date   FOOT ARTHRODESIS, TRIPLE Left 06/18/2022   HIP ARTHROPLASTY Right 11/2021   Patient Active Problem List   Diagnosis Date Noted   Left corneal abrasion 08/24/2022   Primary osteoarthritis of right hip 05/20/2021   Right inguinal pain 05/04/2021   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   Erectile dysfunction 06/09/2020   Pinched nerve in shoulder, left 09/11/2019   Pes planus, congenital 05/26/2018   Restless leg syndrome 05/26/2018   Arthritis of knee 10/25/2016   GERD without esophagitis 04/20/2016   Type II diabetes mellitus with complication (HCC) 06/16/2015   Hyperlipidemia associated with type 2 diabetes mellitus (HCC) 06/16/2015   OSA (obstructive sleep apnea) 03/10/2015   Recurrent major depressive disorder, in partial remission (HCC) 02/19/2015   Family history of digestive disorder 02/19/2015   Tobacco use disorder, mild, in sustained remission 02/19/2015   PCP: Bari Edward MD  REFERRING PROVIDER: Romeo Rabon MD  REFERRING DIAG: [phone] (ICD-10-CM) - Hx of foot surgery   Rationale for Evaluation and Treatment: Rehabilitation  THERAPY DIAG: Muscle weakness (generalized)  ONSET DATE: 06/18/22  FOLLOW-UP APPT SCHEDULED WITH REFERRING PROVIDER: Yes   FROM INITIAL EVALUATION SUBJECTIVE:                                                                                                                                                                                          SUBJECTIVE STATEMENT:  s/p L foot triple realignment arthrodesis, TAL, proximal tibia autograft for progressive collapsing flatfoot deformity on 06/18/2022.  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 He underwent left foot triple realignment arthrodesis, TAL, proximal tibia autograft for progressive collapsing flatfoot deformity on 06/18/2022. He required take back to the OR same day for hardware exchange of the inferior left subtalar screw due to malposition and was admitted overnight for observation. Unfortunately 6 weeks  after the surgery he had a fall and broke the top of one of the screws. Pt reports that his surgeon was not concerned about the broken hardware. After the surgery pt was non-weightbearing for 7 weeks followed by 5 an additional weeks in a walking boot. He was taken out of the boot by his surgeon approximately 5 weeks ago. As soon as he came out of the walking boot he had a lot of pain in his foot with walking. 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 He lives in [place] with his husband and 2 dogs. He lives in a house that has one flight of stairs to the second level but he denies any difficulty with steps. He does note persistent swelling in his L ankle with activity. He has not had surgery in his RLE but states that it will happen at some point in the future. He wears a custom orthotic in his R shoe and is wearing a heel cushion in his L shoe.  He has a history of prior R THR (11/26/21) as well as chronic bilateral knee pain s/p injections.  PAIN:    Pain Intensity: Present: 2/10, Best: 0/10, Worst: 9/10 Pain location: Medial and lateral left foot pain Pain Quality: achy on medial side of left foot and sharp on lateral side;  Radiating: Yes,  radiates down the foot but never radiates proximally up the leg Swelling: Yes, with activity Popping, catching, locking: Yes, "it cracks every now and then which feels good."  Numbness/Tingling: Yes, numbness across the top and lateral aspect since the surgery; Focal Weakness: No Aggravating factors: working around the house, extended standing, extended walking; Relieving factors: laying down, ice,  24-hour pain behavior: Better in the AM, worsens with activity; History of prior back, hip, knee, or ankle injury, pain, surgery, or therapy: Yes, R THR, chronic L hip pain, chronic bilateral knee pain s/p injections Dominant hand: right Imaging: Yes, imaging since the recent fall  Typical footwear: Sneakers Red flags: Negative for personal history of cancer, chills/fever, night sweats, nausea, vomiting, unrelenting pain  PRECAUTIONS: None  WEIGHT BEARING RESTRICTIONS: No  FALLS: Has patient fallen in last 6 months? Yes. 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  Recent memory is intact.  Remote memory is intact.  Attention span and concentration are intact.  Expressive speech is intact.  Patient's fund of knowledge is within normal limits for educational level.    Gross Musculoskeletal Assessment Healed and closed surgical incisions. Bruising on the dorsal aspect of L foot. Otherwise no gross edema, erythema, or ecchymosis noted in foot.  GAIT: Distance walked:  100' Assistive device utilized: None Level of assistance: Complete Independence Comments: Bilateral external rotation, significant navicular drop and pronation noted. (R > L)  Posture: No gross postural issues related to patient's referral  AROM AROM (Normal range in degrees) AROM       Hip Right Left      Knee    Flexion (  135) WNL WNL  Extension (0) WNL WNL      Ankle    Dorsiflexion (20) 32 2  Plantarflexion (50) 50 26  Inversion (35) 26 8  Eversion (15) 16 10  (* = pain; Blank rows = not tested)   LE MMT: MMT (out of 5) Right  Left   Hip flexion 5 5  Hip extension    Hip abduction    Hip adduction    Hip internal rotation 5 5  Hip external rotation 5 5  Knee flexion (seated) 5 5  Knee extension 5 5  Ankle dorsiflexion 5 5  Ankle plantarflexion strong strong  Ankle inversion 5 4  Ankle eversion 5 5  (* = pain; Blank rows = not tested)  Sensation Sensation intact, sight pain on incisional sight LLE.  Palpation No gross pain noted throughout foot and ankle. Minor tenderness noted across anterior talocrural joint line.  Passive Accessory Motion Deferred  VASCULAR Unable to palpate dorsalis pedis and posterior tibial pulses    TODAY'S TREATMENT:  SUBJECTIVE: Pt reports feeling sore on right knee after rest two nights. Tender to palpation at patellar tendon today, "stabbing pain". Denies falls.  no further questions or concerns.   PAIN: 2/10, Stabbing Pain in Anterolateral knee   Ther-ex Ankle Dorsiflexion, Eversion, Inversion, Plantarflexion manual resistance 2 x 10 ea direction;   Vitals after emesis: BP: 140/84, HR: 96 bpm, SpO2: 96% HEP reviewed and updated.   Not Performed:  Standing Calf Raises 2 x 10;  Green Balance Pad 2 x 30 sec BLE; Airex pad with stair tap 2 x 10 BLE;   Manual Therapy Talocrural Distraction Grade III-IV 3 bouts of 30s; Talocrural AP Mobilization Grade III-IV 3 bouts of 30s; Distal Tib-Fib AP Mobilization Grade III  2 x 30s   PATIENT EDUCATION:  Education details: plan of care and HEP Person educated: Patient Education method: Explanation, Demonstration, Verbal cues, and Handouts Education comprehension: verbalized understanding   HOME EXERCISE PROGRAM:  Access Code: [email or web address] URL: [email or web address] Date: 10/20/2022 Prepared by: Ria Comment  Exercises - Seated Ankle Alphabet (Mirrored)  - 2 x daily - 7 x weekly - 2 reps - Seated Ankle Circles  - 2 x daily - 7 x weekly - 2 sets - 10 reps - Seated Toe Towel Scrunches  - 2 x aily - 7 x weekly - 2 reps - 1-2 minutes hold  ASSESSMENT:  CLINICAL IMPRESSION: Patient arrives highly motivated for physical therapy. Joint mobilizations at talocrural joint presented with normal motion in AP direction; pt reported improvement in pain with continued motion. Pt responded well with talocrual distraction and distal tib-fib AP mobilization. Audible cavitation with eccentric resistance to plantarflexion with pt reporting improvement to pain. Session limited due to symptoms of nausea with emesis. Remainder of session focused on lower leg strengthening in long sitting and discussion about HEP. BP monitored after emesis, BP: 140/84, HR: 96 bpm, SpO2: 96%. PT endorsed pt to notify PCP regarding new symptoms. No updates to HEP this session. Pt will continue to benefit from PT services to address deficits in strength, balance, and mobility in order to return to full function at home.   OBJECTIVE IMPAIRMENTS: Abnormal gait, difficulty walking, decreased ROM, decreased strength, and pain.   ACTIVITY LIMITATIONS: standing  PARTICIPATION LIMITATIONS: cleaning, shopping, community activity, and occupation  PERSONAL FACTORS: Past/current experiences, Time since onset of injury/illness/exacerbation, and 3+ comorbidities: depression, knee OA, DM, and pes planus  are also affecting patient's functional outcome.   REHAB  POTENTIAL: Good  CLINICAL DECISION  MAKING: Unstable/unpredictable  EVALUATION COMPLEXITY: High   GOALS: Goals reviewed with patient? No  SHORT TERM GOALS: Target date: 12/01/2022  Pt will be independent with HEP to improve strength and decrease ankle pain to improve pain-free function at home and work. Baseline:  Goal status: INITIAL   LONG TERM GOALS: Target date: 01/12/2023  Pt will increase FOTO to at least 62% to demonstrate significant improvement in function at home and work related to ankle pain  Baseline: 10/20/22: 56% Goal status: INITIAL  2.  Pt will decrease worst ankle pain by at least 3 points on the NPRS in order to demonstrate clinically significant reduction in ankle pain. Baseline: 10/20/22: worst: 9/10; Goal status: INITIAL  4.  Pt will increase strength of L ankle inversion by at least 1/2 MMT grade in order to demonstrate improvement in strength and function  Baseline: 10/20/22: 4/5; Goal status: INITIAL   PLAN: PT FREQUENCY: 1-2x/week  PT DURATION: 12 weeks  PLANNED INTERVENTIONS: Therapeutic exercises, Therapeutic activity, Neuromuscular re-education, Balance training, Gait training, Patient/Family education, Self Care, Joint mobilization, Joint manipulation, Vestibular training, Canalith repositioning, Orthotic/Fit training, DME instructions, Dry Needling, Electrical stimulation, Spinal manipulation, Spinal mobilization, Cryotherapy, Moist heat, Taping, Traction, Ultrasound, Ionotophoresis 4mg /ml Dexamethasone, Manual therapy, and Re-evaluation.  PLAN FOR NEXT SESSION:  Joint mobilizations, progress strengthening, and review/modify HEP as needed;   Lynnea Maizes PT, DPT, GCS  Chris Matty Deamer SPT Huprich,Jason, PT 10/28/2022, 12:48 PM

## 2022-11-01 LAB — HM DIABETES EYE EXAM

## 2022-11-03 ENCOUNTER — Ambulatory Visit: Payer: Medicare Other

## 2022-11-03 DIAGNOSIS — M6281 Muscle weakness (generalized): Secondary | ICD-10-CM

## 2022-11-03 NOTE — Therapy (Cosign Needed)
OUTPATIENT PHYSICAL THERAPY ANKLE TREATMENT  Patient Name: Anthony Cohen MRN: [id number] DOB:05-02-1960, 62 y.o., male Today's Date: 11/04/2022   PT End of Session - 11/03/22 1402     Visit Number 3    Number of Visits 25    Date for PT Re-Evaluation 01/12/23    Authorization Type eval: 10/20/22    PT Start Time 1400    PT Stop Time 1445    PT Time Calculation (min) 45 min    Activity Tolerance Patient tolerated treatment well    Behavior During Therapy [facility] for tasks assessed/performed             Past Medical History:  Diagnosis Date   Hypercholesteremia    Past Surgical History:  Procedure Laterality Date   FOOT ARTHRODESIS, TRIPLE Left 06/18/2022   HIP ARTHROPLASTY Right 11/2021   Patient Active Problem List   Diagnosis Date Noted   Left corneal abrasion 08/24/2022   Primary osteoarthritis of right hip 05/20/2021   Right inguinal pain 05/04/2021   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   Erectile dysfunction 06/09/2020   Pinched nerve in shoulder, left 09/11/2019   Pes planus, congenital 05/26/2018   Restless leg syndrome 05/26/2018   Arthritis of knee 10/25/2016   GERD without esophagitis 04/20/2016   Type II diabetes mellitus with complication (HCC) 06/16/2015   Hyperlipidemia associated with type 2 diabetes mellitus (HCC) 06/16/2015   OSA (obstructive sleep apnea) 03/10/2015   Recurrent major depressive disorder, in partial remission (HCC) 02/19/2015   Family history of digestive disorder 02/19/2015   Tobacco use disorder, mild, in sustained remission 02/19/2015   PCP: Bari Edward MD  REFERRING PROVIDER: Romeo Rabon MD  REFERRING DIAG: [phone] (ICD-10-CM) - Hx of foot surgery   Rationale for Evaluation and Treatment: Rehabilitation  THERAPY DIAG: Muscle weakness (generalized)  ONSET DATE: 06/18/22  FOLLOW-UP APPT SCHEDULED WITH REFERRING PROVIDER: Yes   FROM INITIAL EVALUATION SUBJECTIVE:                                                                                                                                                                                          SUBJECTIVE STATEMENT:  s/p L foot triple realignment arthrodesis, TAL, proximal tibia autograft for progressive collapsing flatfoot deformity on 06/18/2022.  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 He underwent left foot triple realignment arthrodesis, TAL, proximal tibia autograft for progressive collapsing flatfoot deformity on 06/18/2022. He required take back to the OR same day for hardware exchange of the inferior left subtalar screw due to malposition and was admitted overnight for observation. Unfortunately 6 weeks after the surgery he had  a fall and broke the top of one of the screws. Pt reports that his surgeon was not concerned about the broken hardware. After the surgery pt was non-weightbearing for 7 weeks followed by 5 an additional weeks in a walking boot. He was taken out of the boot by his surgeon approximately 5 weeks ago. As soon as he came out of the walking boot he had a lot of pain in his foot with walking. 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 He lives in [place] with his husband and 2 dogs. He lives in a house that has one flight of stairs to the second level but he denies any difficulty with steps. He does note persistent swelling in his L ankle with activity. He has not had surgery in his RLE but states that it will happen at some point in the future. He wears a custom orthotic in his R shoe and is wearing a heel cushion in his L shoe.  He has a history of prior R THR (11/26/21) as well as chronic bilateral knee pain s/p injections.  PAIN:    Pain Intensity: Present: 2/10, Best: 0/10, Worst: 9/10 Pain location: Medial and lateral left foot pain Pain Quality: achy on medial side of left foot and sharp on lateral side;  Radiating: Yes, radiates down the foot but never  radiates proximally up the leg Swelling: Yes, with activity Popping, catching, locking: Yes, "it cracks every now and then which feels good."  Numbness/Tingling: Yes, numbness across the top and lateral aspect since the surgery; Focal Weakness: No Aggravating factors: working around the house, extended standing, extended walking; Relieving factors: laying down, ice,  24-hour pain behavior: Better in the AM, worsens with activity; History of prior back, hip, knee, or ankle injury, pain, surgery, or therapy: Yes, R THR, chronic L hip pain, chronic bilateral knee pain s/p injections Dominant hand: right Imaging: Yes, imaging since the recent fall  Typical footwear: Sneakers Red flags: Negative for personal history of cancer, chills/fever, night sweats, nausea, vomiting, unrelenting pain  PRECAUTIONS: None  WEIGHT BEARING RESTRICTIONS: No  FALLS: Has patient fallen in last 6 months? Yes. 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  Recent memory is intact.  Remote memory is intact.  Attention span and concentration are intact.  Expressive speech is intact.  Patient's fund of knowledge is within normal limits for educational level.    Gross Musculoskeletal Assessment Healed and closed surgical incisions. Bruising on the dorsal aspect of L foot. Otherwise no gross edema, erythema, or ecchymosis noted in foot.  GAIT: Distance walked: 100' Assistive device utilized: None Level  of assistance: Complete Independence Comments: Bilateral external rotation, significant navicular drop and pronation noted. (R > L)  Posture: No gross postural issues related to patient's referral  AROM AROM (Normal range in degrees) AROM       Hip Right Left      Knee    Flexion (135) WNL WNL  Extension (  0) WNL WNL      Ankle    Dorsiflexion (20) 32 2  Plantarflexion (50) 50 26  Inversion (35) 26 8  Eversion (15) 16 10  (* = pain; Blank rows = not tested)   LE MMT: MMT (out of 5) Right  Left   Hip flexion 5 5  Hip extension    Hip abduction    Hip adduction    Hip internal rotation 5 5  Hip external rotation 5 5  Knee flexion (seated) 5 5  Knee extension 5 5  Ankle dorsiflexion 5 5  Ankle plantarflexion strong strong  Ankle inversion 5 4  Ankle eversion 5 5  (* = pain; Blank rows = not tested)  Sensation Sensation intact, sight pain on incisional sight LLE.  Palpation No gross pain noted throughout foot and ankle. Minor tenderness noted across anterior talocrural joint line.  Passive Accessory Motion Deferred  VASCULAR Unable to palpate dorsalis pedis and posterior tibial pulses    TODAY'S TREATMENT:  SUBJECTIVE: Pt reports feeling well today; additional report of vertigo following last session that lasted a few hours. No recent episodes of vertigo since last session.   PAIN: 0/10  Ther-ex Ankle Dorsiflexion, Eversion, Inversion, Plantarflexion Green TB resistance 2 x 10 ea direction; (Added to HEP) Standing Balance on Wobble Pad 3 x 30s; Standing Calf Raises BLE with UE support 2 x 10; Step Gastrocnemius Stretch 2 x 10; Standing Toe Raises at Wall 2 x 10 x 10s hold;  Manual Therapy Talocrural AP Mobilization Grade III-IV 3 bouts of 30s; Talocrural Distraction 2 bouts of 30s;  Neuromuscular Re-education  Dix-Hallpike: Negative bilaterally for both vertigo and nystagmus Roll Test: Negative bilaterally for both vertigo and nystagmus  Not  Performed:  Standing Calf Raises 2 x 10;  Green Balance Pad 2 x 30 sec BLE; Airex pad with stair tap 2 x 10 BLE;  PATIENT EDUCATION:  Education details: plan of care and HEP Person educated: Patient Education method: Explanation, Demonstration, Verbal cues, and Handouts Education comprehension: verbalized understanding   HOME EXERCISE PROGRAM:  Access Code: [email or web address] URL: [email or web address] Date: 11/04/2022 Prepared by: Ria Comment  Exercises - Seated Ankle Alphabet (Mirrored)  - 2 x daily - 7 x weekly - 2 reps - Seated Ankle Circles  - 2 x daily - 7 x weekly - 2 sets - 10 reps - Seated Figure 4 Ankle Inversion with Resistance (Mirrored)  - 2 x daily - 7 x weekly - 2 sets - 10 reps - 3s hold - Seated Ankle Eversion with Resistance  - 2 x daily - 7 x weekly - 2 sets - 10 reps - 3s hold - Seated Ankle Plantar Flexion with Resistance Loop (Mirrored)  - 2 x daily - 7 x weekly - 2 sets - 10 reps - 3s hold  ASSESSMENT:  CLINICAL IMPRESSION: Patient arrives highly motivated for physical therapy. Able to tolerate increase to resistance with ankle exercises; no additional reports of pain. PT performed BPPV testing due to pt report of vertigo; posterior canal and horizontal canal testing negative. End of session included discussion and update to HEP; included additional resisted ankle exercise. The patient is demonstrating significant progress with left ankle and will continue to benefit from PT services to address deficits in strength, balance, and mobility in order to return to full function at home.   OBJECTIVE IMPAIRMENTS: Abnormal gait, difficulty walking, decreased ROM, decreased strength, and pain.   ACTIVITY LIMITATIONS: standing  PARTICIPATION LIMITATIONS: cleaning, shopping, community  activity, and occupation  PERSONAL FACTORS: Past/current experiences, Time since onset of injury/illness/exacerbation, and 3+ comorbidities: depression, knee OA, DM, and pes planus   are also affecting patient's functional outcome.   REHAB POTENTIAL: Good  CLINICAL DECISION MAKING: Unstable/unpredictable  EVALUATION COMPLEXITY: High   GOALS: Goals reviewed with patient? No  SHORT TERM GOALS: Target date: 12/01/2022  Pt will be independent with HEP to improve strength and decrease ankle pain to improve pain-free function at home and work. Baseline:  Goal status: INITIAL   LONG TERM GOALS: Target date: 01/12/2023  Pt will increase FOTO to at least 62% to demonstrate significant improvement in function at home and work related to ankle pain  Baseline: 10/20/22: 56% Goal status: INITIAL  2.  Pt will decrease worst ankle pain by at least 3 points on the NPRS in order to demonstrate clinically significant reduction in ankle pain. Baseline: 10/20/22: worst: 9/10; Goal status: INITIAL  4.  Pt will increase strength of L ankle inversion by at least 1/2 MMT grade in order to demonstrate improvement in strength and function  Baseline: 10/20/22: 4/5; Goal status: INITIAL   PLAN: PT FREQUENCY: 1-2x/week  PT DURATION: 12 weeks  PLANNED INTERVENTIONS: Therapeutic exercises, Therapeutic activity, Neuromuscular re-education, Balance training, Gait training, Patient/Family education, Self Care, Joint mobilization, Joint manipulation, Vestibular training, Canalith repositioning, Orthotic/Fit training, DME instructions, Dry Needling, Electrical stimulation, Spinal manipulation, Spinal mobilization, Cryotherapy, Moist heat, Taping, Traction, Ultrasound, Ionotophoresis 4mg /ml Dexamethasone, Manual therapy, and Re-evaluation.  PLAN FOR NEXT SESSION:  Joint mobilizations, progress strengthening, and review/modify HEP as needed;   Lynnea Maizes PT, DPT, GCS  Chris Vincente Asbridge SPT Huprich,Jason, PT 11/04/2022, 11:47 AM

## 2022-11-10 ENCOUNTER — Ambulatory Visit: Payer: Medicare Other

## 2022-11-10 DIAGNOSIS — M6281 Muscle weakness (generalized): Secondary | ICD-10-CM

## 2022-11-10 NOTE — Therapy (Unsigned)
OUTPATIENT PHYSICAL THERAPY ANKLE TREATMENT  Patient Name: Anthony Cohen MRN: [id number] DOB:13-Apr-1960, 62 y.o., male Today's Date: 11/11/2022   PT End of Session - 11/10/22 1410     Visit Number 4    Number of Visits 25    Date for PT Re-Evaluation 01/12/23    Authorization Type eval: 10/20/22    PT Start Time 1410    PT Stop Time 1445    PT Time Calculation (min) 35 min    Activity Tolerance Patient tolerated treatment well    Behavior During Therapy [facility] for tasks assessed/performed             Past Medical History:  Diagnosis Date   Hypercholesteremia    Past Surgical History:  Procedure Laterality Date   FOOT ARTHRODESIS, TRIPLE Left 06/18/2022   HIP ARTHROPLASTY Right 11/2021   Patient Active Problem List   Diagnosis Date Noted   Left corneal abrasion 08/24/2022   Primary osteoarthritis of right hip 05/20/2021   Right inguinal pain 05/04/2021   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   Erectile dysfunction 06/09/2020   Pinched nerve in shoulder, left 09/11/2019   Pes planus, congenital 05/26/2018   Restless leg syndrome 05/26/2018   Arthritis of knee 10/25/2016   GERD without esophagitis 04/20/2016   Type II diabetes mellitus with complication (HCC) 06/16/2015   Hyperlipidemia associated with type 2 diabetes mellitus (HCC) 06/16/2015   OSA (obstructive sleep apnea) 03/10/2015   Recurrent major depressive disorder, in partial remission (HCC) 02/19/2015   Family history of digestive disorder 02/19/2015   Tobacco use disorder, mild, in sustained remission 02/19/2015   PCP: Bari Edward MD  REFERRING PROVIDER: Romeo Rabon MD  REFERRING DIAG: [phone] (ICD-10-CM) - Hx of foot surgery   Rationale for Evaluation and Treatment: Rehabilitation  THERAPY DIAG: Muscle weakness (generalized)  ONSET DATE: 06/18/22  FOLLOW-UP APPT SCHEDULED WITH REFERRING PROVIDER: Yes   FROM INITIAL EVALUATION SUBJECTIVE:                                                                                                                                                                                          SUBJECTIVE STATEMENT:  s/p L foot triple realignment arthrodesis, TAL, proximal tibia autograft for progressive collapsing flatfoot deformity on 06/18/2022.  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 He underwent left foot triple realignment arthrodesis, TAL, proximal tibia autograft for progressive collapsing flatfoot deformity on 06/18/2022. He required take back to the OR same day for hardware exchange of the inferior left subtalar screw due to malposition and was admitted overnight for observation. Unfortunately 6 weeks after the surgery he had  a fall and broke the top of one of the screws. Pt reports that his surgeon was not concerned about the broken hardware. After the surgery pt was non-weightbearing for 7 weeks followed by 5 an additional weeks in a walking boot. He was taken out of the boot by his surgeon approximately 5 weeks ago. As soon as he came out of the walking boot he had a lot of pain in his foot with walking. 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 He lives in [place] with his husband and 2 dogs. He lives in a house that has one flight of stairs to the second level but he denies any difficulty with steps. He does note persistent swelling in his L ankle with activity. He has not had surgery in his RLE but states that it will happen at some point in the future. He wears a custom orthotic in his R shoe and is wearing a heel cushion in his L shoe.  He has a history of prior R THR (11/26/21) as well as chronic bilateral knee pain s/p injections.  PAIN:    Pain Intensity: Present: 2/10, Best: 0/10, Worst: 9/10 Pain location: Medial and lateral left foot pain Pain Quality: achy on medial side of left foot and sharp on lateral side;  Radiating: Yes, radiates down the foot but never  radiates proximally up the leg Swelling: Yes, with activity Popping, catching, locking: Yes, "it cracks every now and then which feels good."  Numbness/Tingling: Yes, numbness across the top and lateral aspect since the surgery; Focal Weakness: No Aggravating factors: working around the house, extended standing, extended walking; Relieving factors: laying down, ice,  24-hour pain behavior: Better in the AM, worsens with activity; History of prior back, hip, knee, or ankle injury, pain, surgery, or therapy: Yes, R THR, chronic L hip pain, chronic bilateral knee pain s/p injections Dominant hand: right Imaging: Yes, imaging since the recent fall  Typical footwear: Sneakers Red flags: Negative for personal history of cancer, chills/fever, night sweats, nausea, vomiting, unrelenting pain  PRECAUTIONS: None  WEIGHT BEARING RESTRICTIONS: No  FALLS: Has patient fallen in last 6 months? Yes. 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  Recent memory is intact.  Remote memory is intact.  Attention span and concentration are intact.  Expressive speech is intact.  Patient's fund of knowledge is within normal limits for educational level.    Gross Musculoskeletal Assessment Healed and closed surgical incisions. Bruising on the dorsal aspect of L foot. Otherwise no gross edema, erythema, or ecchymosis noted in foot.  GAIT: Distance walked: 100' Assistive device utilized: None Level  of assistance: Complete Independence Comments: Bilateral external rotation, significant navicular drop and pronation noted. (R > L)  Posture: No gross postural issues related to patient's referral  AROM AROM (Normal range in degrees) AROM       Hip Right Left      Knee    Flexion (135) WNL WNL  Extension (  0) WNL WNL      Ankle    Dorsiflexion (20) 32 2  Plantarflexion (50) 50 26  Inversion (35) 26 8  Eversion (15) 16 10  (* = pain; Blank rows = not tested)   LE MMT: MMT (out of 5) Right  Left   Hip flexion 5 5  Hip extension    Hip abduction    Hip adduction    Hip internal rotation 5 5  Hip external rotation 5 5  Knee flexion (seated) 5 5  Knee extension 5 5  Ankle dorsiflexion 5 5  Ankle plantarflexion strong strong  Ankle inversion 5 4  Ankle eversion 5 5  (* = pain; Blank rows = not tested)  Sensation Sensation intact, sight pain on incisional sight LLE.  Palpation No gross pain noted throughout foot and ankle. Minor tenderness noted across anterior talocrural joint line.  Passive Accessory Motion Deferred  VASCULAR Unable to palpate dorsalis pedis and posterior tibial pulses    TODAY'S TREATMENT:  SUBJECTIVE: Pt reports pain in the left foot towards the incisional site and achilles tendon. Pain occurs with weight bearing activities, pt reports "it feels deep in the foot" Rest has mitigated the pain but it has been persistent within the last two days. Pt reports he has a follow up appt with surgeon and will report to provider about pain. No further questions or concerns.   PAIN: 4/10  Ther-ex Ankle Dorsiflexion, Eversion, Inversion, Plantarflexion Green TB resistance 2 x 10 x 5s hold ea direction; Single Leg Balance on Wobble Pad 2 x 30s BLE (Pain in Left foot); Step Gastrocnemius Stretch 2 x 20s (Pain in left foot) ; Standing Toe Raises at Wall 2 x 10 x 5s hold; Standing Toe Raises with Resistance Bands around BLE 1 x 10 x 5s hold Updated HEP to  include Toe Raises  Manual Therapy Manual Gastrocnemius Stretch 3 x 30s; Midfoot/Forefoot Mobilization Grade II-III 3 bouts of 30s; Talocrural Distraction 2 bouts of 30s;  Not Performed:  Standing Calf Raises 2 x 10;  Green Balance Pad 2 x 30 sec BLE; Airex pad with stair tap 2 x 10 BLE;   PATIENT EDUCATION:  Education details: plan of care and HEP Person educated: Patient Education method: Explanation, Demonstration, Verbal cues, and Handouts Education comprehension: verbalized understanding   HOME EXERCISE PROGRAM:  Access Code: [email or web address] URL: [email or web address] Date: 11/04/2022 Prepared by: Ria Comment  Exercises - Seated Ankle Alphabet (Mirrored)  - 2 x daily - 7 x weekly - 2 reps - Seated Ankle Circles  - 2 x daily - 7 x weekly - 2 sets - 10 reps - Seated Figure 4 Ankle Inversion with Resistance (Mirrored)  - 2 x daily - 7 x weekly - 2 sets - 10 reps - 3s hold - Seated Ankle Eversion with Resistance  - 2 x daily - 7 x weekly - 2 sets - 10 reps - 3s hold - Seated Ankle Plantar Flexion with Resistance Loop (Mirrored)  - 2 x daily - 7 x weekly - 2 sets - 10 reps - 3s hold  ASSESSMENT:  CLINICAL IMPRESSION: Patient arrives highly motivated for physical therapy. Session limited due to pain in left foot with dependent positions. Focus of the session on ankle strengthening in open-chain position and manual therapy to mitigate pain. PT performed Talocrual Distraction with good response from the patient. Pt reported pain with weight bearing exercises however he was able to tolerate wall toe raises with resistance from theraband. Pt  will update PT on status of foot after follow up with physician prior to next visit. Currently, the patient is demonstrating significant progress with left ankle and will continue to benefit from PT services to address deficits in strength, balance, and mobility in order to return to full function at home.   OBJECTIVE IMPAIRMENTS:  Abnormal gait, difficulty walking, decreased ROM, decreased strength, and pain.   ACTIVITY LIMITATIONS: standing  PARTICIPATION LIMITATIONS: cleaning, shopping, community activity, and occupation  PERSONAL FACTORS: Past/current experiences, Time since onset of injury/illness/exacerbation, and 3+ comorbidities: depression, knee OA, DM, and pes planus  are also affecting patient's functional outcome.   REHAB POTENTIAL: Good  CLINICAL DECISION MAKING: Unstable/unpredictable  EVALUATION COMPLEXITY: High   GOALS: Goals reviewed with patient? No  SHORT TERM GOALS: Target date: 12/01/2022  Pt will be independent with HEP to improve strength and decrease ankle pain to improve pain-free function at home and work. Baseline:  Goal status: INITIAL   LONG TERM GOALS: Target date: 01/12/2023  Pt will increase FOTO to at least 62% to demonstrate significant improvement in function at home and work related to ankle pain  Baseline: 10/20/22: 56% Goal status: INITIAL  2.  Pt will decrease worst ankle pain by at least 3 points on the NPRS in order to demonstrate clinically significant reduction in ankle pain. Baseline: 10/20/22: worst: 9/10; Goal status: INITIAL  4.  Pt will increase strength of L ankle inversion by at least 1/2 MMT grade in order to demonstrate improvement in strength and function  Baseline: 10/20/22: 4/5; Goal status: INITIAL   PLAN: PT FREQUENCY: 1-2x/week  PT DURATION: 12 weeks  PLANNED INTERVENTIONS: Therapeutic exercises, Therapeutic activity, Neuromuscular re-education, Balance training, Gait training, Patient/Family education, Self Care, Joint mobilization, Joint manipulation, Vestibular training, Canalith repositioning, Orthotic/Fit training, DME instructions, Dry Needling, Electrical stimulation, Spinal manipulation, Spinal mobilization, Cryotherapy, Moist heat, Taping, Traction, Ultrasound, Ionotophoresis 4mg /ml Dexamethasone, Manual therapy, and  Re-evaluation.  PLAN FOR NEXT SESSION:  Joint mobilizations, progress strengthening, and review/modify HEP as needed;   Lynnea Maizes PT, DPT, GCS  Chris Bernette Seeman SPT [place],Jason, PT 11/11/2022, 6:04 PM

## 2022-11-17 ENCOUNTER — Ambulatory Visit: Payer: Medicare Other

## 2022-11-18 ENCOUNTER — Other Ambulatory Visit: Payer: Self-pay | Admitting: Internal Medicine

## 2022-11-18 DIAGNOSIS — F3341 Major depressive disorder, recurrent, in partial remission: Secondary | ICD-10-CM

## 2023-02-21 ENCOUNTER — Encounter: Payer: Self-pay | Admitting: Internal Medicine

## 2023-02-21 NOTE — Telephone Encounter (Signed)
 Care team updated and letter sent for eye exam notes.

## 2023-02-24 ENCOUNTER — Other Ambulatory Visit
Admission: RE | Admit: 2023-02-24 | Discharge: 2023-02-24 | Disposition: A | Discharge status: Home / Self Care | Payer: Medicare Other | Attending: Internal Medicine | Admitting: Internal Medicine

## 2023-02-24 ENCOUNTER — Encounter: Payer: Self-pay | Admitting: Internal Medicine

## 2023-02-24 ENCOUNTER — Ambulatory Visit: Payer: Medicare Other | Admitting: Internal Medicine

## 2023-02-24 VITALS — BP 124/76 | HR 86 | Ht 72.0 in | Wt 235.0 lb

## 2023-02-24 DIAGNOSIS — E118 Type 2 diabetes mellitus with unspecified complications: Secondary | ICD-10-CM | POA: Diagnosis present

## 2023-02-24 DIAGNOSIS — E785 Hyperlipidemia, unspecified: Secondary | ICD-10-CM

## 2023-02-24 DIAGNOSIS — K219 Gastro-esophageal reflux disease without esophagitis: Secondary | ICD-10-CM | POA: Diagnosis not present

## 2023-02-24 DIAGNOSIS — M62838 Other muscle spasm: Secondary | ICD-10-CM

## 2023-02-24 DIAGNOSIS — F3341 Major depressive disorder, recurrent, in partial remission: Secondary | ICD-10-CM

## 2023-02-24 DIAGNOSIS — E1169 Type 2 diabetes mellitus with other specified complication: Secondary | ICD-10-CM | POA: Diagnosis not present

## 2023-02-24 LAB — HEMOGLOBIN A1C
Hgb A1c MFr Bld: 5.8 % — ABNORMAL HIGH (ref 4.8–5.6)
Mean Plasma Glucose: 119.76 mg/dL

## 2023-02-24 MED ORDER — CYCLOBENZAPRINE HCL 10 MG PO TABS: 10.0000 mg | ORAL_TABLET | Freq: Every day | ORAL | 0 refills | Status: DC

## 2023-02-24 NOTE — Assessment & Plan Note (Signed)
Clinically stable on Effexor and Bupropion with good response, No SI or HI reported. No change in management at this time.

## 2023-02-24 NOTE — Progress Notes (Signed)
Date:  02/24/2023   Name:  Anthony Cohen   DOB:  [birth year]   MRN:  [id number]   Chief Complaint: Diabetes and Depression  Diabetes He presents for his follow-up diabetic visit. He has type 2 diabetes mellitus. His disease course has been stable. Pertinent negatives for hypoglycemia include no dizziness, headaches or nervousness/anxiousness. Pertinent negatives for diabetes include no chest pain, no fatigue, no weakness and no weight loss. Symptoms are stable. Current diabetic treatment includes diet.  Hyperlipidemia This is a chronic problem. Recent lipid tests were reviewed and are high. Associated symptoms include myalgias (neck muscle stiffness). Pertinent negatives include no chest pain or shortness of breath. Current antihyperlipidemic treatment includes ezetimibe. The current treatment provides moderate improvement of lipids.  Gastroesophageal Reflux He complains of heartburn. He reports no abdominal pain, no chest pain, no coughing or no wheezing. This is a recurrent problem. The problem occurs rarely. Pertinent negatives include no fatigue or weight loss. He has tried a histamine-2 antagonist for the symptoms.  Depression        This is a chronic problem.The problem is unchanged.  Associated symptoms include myalgias (neck muscle stiffness).  Associated symptoms include no fatigue and no headaches.  Past treatments include SNRIs - Serotonin and norepinephrine reuptake inhibitors and other medications. Neck Pain  This is a new problem. The current episode started in the past 7 days. The problem has been unchanged. The pain is associated with nothing. The pain is present in the midline. The quality of the pain is described as aching and cramping. The pain is mild. Pertinent negatives include no chest pain, headaches, numbness, tingling, trouble swallowing, weakness or weight loss. He has tried heat and NSAIDs for the symptoms. The treatment provided mild relief.    Review of Systems   Constitutional:  Negative for fatigue, unexpected weight change and weight loss.  HENT:  Negative for nosebleeds and trouble swallowing.   Eyes:  Negative for visual disturbance.  Respiratory:  Negative for cough, chest tightness, shortness of breath and wheezing.   Cardiovascular:  Negative for chest pain, palpitations and leg swelling.  Gastrointestinal:  Positive for heartburn. Negative for abdominal pain, constipation and diarrhea.  Musculoskeletal:  Positive for arthralgias, gait problem, myalgias (neck muscle stiffness) and neck pain.  Neurological:  Negative for dizziness, tingling, weakness, light-headedness, numbness and headaches.  Psychiatric/Behavioral:  Positive for depression. Negative for dysphoric mood and sleep disturbance. The patient is not nervous/anxious.      Lab Results  Component Value Date   NA 140 09/07/2022   K 4.6 09/07/2022   CO2 26 09/07/2022   GLUCOSE 107 (H) 09/07/2022   BUN 19 09/07/2022   CREATININE 0.93 09/07/2022   CALCIUM 9.7 09/07/2022   EGFR 93 09/07/2022   GFRNONAA >60 09/21/2019   Lab Results  Component Value Date   CHOL 225 (H) 09/07/2022   HDL 68 09/07/2022   LDLCALC 118 (H) 09/07/2022   TRIG 227 (H) 09/07/2022   CHOLHDL 3.3 09/07/2022   Lab Results  Component Value Date   TSH 1.810 11/05/2021   Lab Results  Component Value Date   HGBA1C 6.0 (H) 09/07/2022   Lab Results  Component Value Date   WBC 3.9 05/05/2022   HGB 14.3 05/05/2022   HCT 42.7 05/05/2022   MCV 96 05/05/2022   PLT 204 05/05/2022   Lab Results  Component Value Date   ALT 47 (H) 03/08/2022   AST 34 03/08/2022   ALKPHOS 64 03/08/2022  BILITOT 0.2 03/08/2022   No results found for: "25OHVITD2", "25OHVITD3", "VD25OH"   Patient Active Problem List   Diagnosis Date Noted   Left corneal abrasion 08/24/2022   Primary osteoarthritis of right hip 05/20/2021   Right inguinal pain 05/04/2021   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   Erectile  dysfunction 06/09/2020   Pinched nerve in shoulder, left 09/11/2019   Pes planus, congenital 05/26/2018   Restless leg syndrome 05/26/2018   Arthritis of knee 10/25/2016   GERD without esophagitis 04/20/2016   Type II diabetes mellitus with complication (HCC) 06/16/2015   Hyperlipidemia associated with type 2 diabetes mellitus (HCC) 06/16/2015   OSA (obstructive sleep apnea) 03/10/2015   Recurrent major depressive disorder, in partial remission (HCC) 02/19/2015   Family history of digestive disorder 02/19/2015   Tobacco use disorder, mild, in sustained remission 02/19/2015    No Known Allergies  Past Surgical History:  Procedure Laterality Date   FOOT ARTHRODESIS, TRIPLE Left 06/18/2022   HIP ARTHROPLASTY Right 11/2021    Social History   Tobacco Use   Smoking status: Former    Current packs/day: 0.00    Average packs/day: 1 pack/day for 3.0 years (3.0 ttl pk-yrs)    Types: Cigarettes    Start date: 03/28/2015    Quit date: 03/29/2015    Years since quitting: 7.9   Smokeless tobacco: Never  Vaping Use   Vaping status: Never Used  Substance Use Topics   Alcohol use: Yes    Alcohol/week: 0.0 standard drinks of alcohol   Drug use: No     Medication list has been reviewed and updated.  Current Meds  Medication Sig   buPROPion (WELLBUTRIN XL) 150 MG 24 hr tablet Take 3 tablets (450 mg total) by mouth daily.   cyclobenzaprine (FLEXERIL) 10 MG tablet Take 1 tablet (10 mg total) by mouth at bedtime.   ezetimibe (ZETIA) 10 MG tablet Take 1 tablet (10 mg total) by mouth daily.   famotidine (PEPCID) 20 MG tablet Take by mouth.   gabapentin (NEURONTIN) 400 MG capsule Take 1 capsule (400 mg total) by mouth 2 (two) times daily.   Misc Natural Products (PROSTATE) CAPS Take by mouth.   Multiple Vitamin (MULTI-VITAMIN) tablet Take 1 tablet by mouth daily.   venlafaxine XR (EFFEXOR-XR) 75 MG 24 hr capsule TAKE THREE CAPSULES BY MOUTH DAILY WITH BREAKFAST       02/24/2023   10:19 AM  09/07/2022   10:53 AM 05/05/2022    9:49 AM 03/08/2022   11:02 AM  GAD 7 : Generalized Anxiety Score  Nervous, Anxious, on Edge 0 0 0 0  Control/stop worrying 0 0 0 0  Worry too much - different things 0 0 0 0  Trouble relaxing 0 0 0 0  Restless 0 0 0 0  Easily annoyed or irritable 0 0 0 0  Afraid - awful might happen 0 0 0 0  Total GAD 7 Score 0 0 0 0  Anxiety Difficulty Not difficult at all Not difficult at all Not difficult at all Not difficult at all       02/24/2023   10:19 AM 09/07/2022   10:53 AM 05/06/2022   10:09 AM  Depression screen PHQ 2/9  Decreased Interest 0 0 0  Down, Depressed, Hopeless 0 0 0  PHQ - 2 Score 0 0 0  Altered sleeping 0 0 0  Tired, decreased energy 1 2 0  Change in appetite 0 0 0  Feeling bad or failure about  yourself  0 0 0  Trouble concentrating 0 0 0  Moving slowly or fidgety/restless 0 0 0  Suicidal thoughts 0 0 0  PHQ-9 Score 1 2 0  Difficult doing work/chores Not difficult at all Not difficult at all Not difficult at all    BP Readings from Last 3 Encounters:  02/24/23 124/76  10/27/22 (!) 140/84  09/07/22 120/80    Physical Exam Constitutional:      Appearance: Normal appearance.  Cardiovascular:     Rate and Rhythm: Normal rate and regular rhythm.     Heart sounds: No murmur heard. Pulmonary:     Effort: Pulmonary effort is normal.     Breath sounds: No wheezing or rhonchi.  Musculoskeletal:        General: Deformity present.     Cervical back: Spasms and tenderness present. Decreased range of motion.  Skin:    General: Skin is warm and dry.     Findings: Lesion present.     Comments: Scattered healing < 5mm lesion on legs, abdomen c/w folliculitis  Neurological:     General: No focal deficit present.     Mental Status: He is alert and oriented to person, place, and time.     Motor: No weakness, tremor or abnormal muscle tone.     Deep Tendon Reflexes:     Reflex Scores:      Bicep reflexes are 2+ on the right side and  2+ on the left side.    Wt Readings from Last 3 Encounters:  02/24/23 235 lb (106.6 kg)  09/07/22 246 lb (111.6 kg)  05/27/22 255 lb 15.3 oz (116.1 kg)    BP 124/76   Pulse 86   Ht 6' (1.829 m)   Wt 235 lb (106.6 kg)   SpO2 97%   BMI 31.87 kg/m   Assessment and Plan:  Problem List Items Addressed This Visit       Unprioritized   Recurrent major depressive disorder, in partial remission (HCC) (Chronic)    Clinically stable on Effexor and Bupropion with good response, No SI or HI reported. No change in management at this time.       Type II diabetes mellitus with complication (HCC) - Primary (Chronic)    Blood sugars stable without hypoglycemic symptoms or events. Currently managed with diet. Changes made last visit are none. Lab Results  Component Value Date   HGBA1C 6.0 (H) 09/07/2022  Eye exam done - will request notes U/Cr + last check - repeat today and begin lisinopril 5 mg if still +       Relevant Orders   Hemoglobin A1c   Microalbumin / creatinine urine ratio   Hyperlipidemia associated with type 2 diabetes mellitus (HCC) (Chronic)    Lipids fairly well controlled on Zetia Patient declines statin due to elevated LFTs on lipitor      GERD without esophagitis (Chronic)    Reflux symptoms are minimal on current therapy - Pepcid. No red flag signs such as weight loss, n/v, melena       Other Visit Diagnoses     Muscle spasms of neck       continue heat, Advil.  Add Flexeril at HS follow up if no improvement   Relevant Medications   cyclobenzaprine (FLEXERIL) 10 MG tablet       Return in about 6 months (around 08/25/2023) for Medicare annual.    Reubin Milan, MD [facility] [place]

## 2023-02-24 NOTE — Assessment & Plan Note (Signed)
Reflux symptoms are minimal on current therapy - Pepcid. No red flag signs such as weight loss, n/v, melena

## 2023-02-24 NOTE — Assessment & Plan Note (Signed)
Lipids fairly well controlled on Zetia Patient declines statin due to elevated LFTs on lipitor

## 2023-02-24 NOTE — Assessment & Plan Note (Addendum)
Blood sugars stable without hypoglycemic symptoms or events. Currently managed with diet. Changes made last visit are none. Lab Results  Component Value Date   HGBA1C 6.0 (H) 09/07/2022  Eye exam done - will request notes U/Cr + last check - repeat today and begin lisinopril 5 mg if still +

## 2023-02-25 LAB — MICROALBUMIN / CREATININE URINE RATIO
Creatinine, Urine: 137.2 mg/dL
Microalb Creat Ratio: 12 mg/g{creat} (ref 0–29)
Microalb, Ur: 16.6 ug/mL — ABNORMAL HIGH

## 2023-03-11 ENCOUNTER — Other Ambulatory Visit: Payer: Self-pay | Admitting: Internal Medicine

## 2023-03-11 ENCOUNTER — Other Ambulatory Visit: Payer: Self-pay

## 2023-03-11 DIAGNOSIS — F3341 Major depressive disorder, recurrent, in partial remission: Secondary | ICD-10-CM

## 2023-03-11 NOTE — Telephone Encounter (Signed)
Requested medication (s) are due for refill today: yes  Requested medication (s) are on the active medication list: yes  Last refill:  09/07/22 #270/1  Future visit scheduled: yes  Notes to clinic:  Unable to refill per protocol due to failed labs, no updated results.    Requested Prescriptions  Pending Prescriptions Disp Refills   buPROPion (WELLBUTRIN XL) 150 MG 24 hr tablet [Pharmacy Med Name: buPROPion HCL XL 150 MG TABLET] 270 tablet 1    Sig: TAKE THREE TABLETS BY MOUTH DAILY     Psychiatry: Antidepressants - bupropion Failed - 03/11/2023  1:23 PM      Failed - AST in normal range and within 360 days    AST  Date Value Ref Range Status  03/08/2022 34 0 - 40 IU/L Final         Failed - ALT in normal range and within 360 days    ALT  Date Value Ref Range Status  03/08/2022 47 (H) 0 - 44 IU/L Final         Passed - Cr in normal range and within 360 days    Creatinine, Ser  Date Value Ref Range Status  09/07/2022 0.93 0.76 - 1.27 mg/dL Final         Passed - Completed PHQ-2 or PHQ-9 in the last 360 days      Passed - Last BP in normal range    BP Readings from Last 1 Encounters:  02/24/23 124/76         Passed - Valid encounter within last 6 months    Recent Outpatient Visits           2 weeks ago Type II diabetes mellitus with complication [facility])   [place] [facility] at [facility], Nyoka Cowden, MD   6 months ago Type II diabetes mellitus with complication [facility])   [place] [facility] at [facility], Nyoka Cowden, MD   10 months ago Preop examination   [facility] at [facility], Nyoka Cowden, MD   1 year ago Type II diabetes mellitus with complication [facility])   [place] Primary Care & Sports Medicine at [facility], Nyoka Cowden, MD   1 year ago Type II diabetes mellitus with complication [facility])   [place] Primary Care & Sports Medicine at  [facility], Nyoka Cowden, MD       Future Appointments             In 5 months Judithann Graves, Nyoka Cowden, MD [facility] Primary Care & Sports Medicine at [facility], [facility]

## 2023-04-11 ENCOUNTER — Other Ambulatory Visit: Payer: Self-pay | Admitting: Internal Medicine

## 2023-04-11 DIAGNOSIS — F3341 Major depressive disorder, recurrent, in partial remission: Secondary | ICD-10-CM

## 2023-04-11 NOTE — Telephone Encounter (Signed)
Request is too soon for refill. Last refill 03/11/23 for 90 days.  Requested Prescriptions  Pending Prescriptions Disp Refills   buPROPion (WELLBUTRIN XL) 150 MG 24 hr tablet [Pharmacy Med Name: buPROPion HCL XL 150 MG TABLET] 270 tablet 0    Sig: TAKE THREE TABLETS BY MOUTH DAILY     Psychiatry: Antidepressants - bupropion Failed - 04/11/2023 11:55 AM      Failed - AST in normal range and within 360 days    AST  Date Value Ref Range Status  03/08/2022 34 0 - 40 IU/L Final         Failed - ALT in normal range and within 360 days    ALT  Date Value Ref Range Status  03/08/2022 47 (H) 0 - 44 IU/L Final         Passed - Cr in normal range and within 360 days    Creatinine, Ser  Date Value Ref Range Status  09/07/2022 0.93 0.76 - 1.27 mg/dL Final         Passed - Completed PHQ-2 or PHQ-9 in the last 360 days      Passed - Last BP in normal range    BP Readings from Last 1 Encounters:  02/24/23 124/76         Passed - Valid encounter within last 6 months    Recent Outpatient Visits           1 month ago Type II diabetes mellitus with complication (HCC)   [place] [facility] at [facility], Nyoka Cowden, MD   7 months ago Type II diabetes mellitus with complication [facility])   [place] [facility] Medicine at [facility], Nyoka Cowden, MD   11 months ago Preop examination   [facility] at [facility], Nyoka Cowden, MD   1 year ago Type II diabetes mellitus with complication [facility])   [place] [facility] & Sports Medicine at [facility], Nyoka Cowden, MD   1 year ago Type II diabetes mellitus with complication [facility])   [place] [facility] Care & Sports Medicine at [facility], Nyoka Cowden, MD       Future Appointments             In 4 months Judithann Graves, Nyoka Cowden, MD [facility] Primary Care & Sports Medicine at [facility], [facility]

## 2023-05-04 ENCOUNTER — Other Ambulatory Visit: Payer: Self-pay | Admitting: Internal Medicine

## 2023-05-04 DIAGNOSIS — E1169 Type 2 diabetes mellitus with other specified complication: Secondary | ICD-10-CM

## 2023-05-04 NOTE — Telephone Encounter (Signed)
Requested medication (s) are due for refill today: yes  Requested medication (s) are on the active medication list: yes  Last refill:  09/07/22 #90 1 RF  Future visit scheduled: yes  Notes to clinic:  overdue lab work    Requested Prescriptions  Pending Prescriptions Disp Refills   ezetimibe (ZETIA) 10 MG tablet [Pharmacy Med Name: EZETIMIBE 10 MG TABLET] 90 tablet 1    Sig: TAKE 1 TABLET BY MOUTH DAILY     Cardiovascular:  Antilipid - Sterol Transport Inhibitors Failed - 05/04/2023  3:44 PM      Failed - AST in normal range and within 360 days    AST  Date Value Ref Range Status  03/08/2022 34 0 - 40 IU/L Final         Failed - ALT in normal range and within 360 days    ALT  Date Value Ref Range Status  03/08/2022 47 (H) 0 - 44 IU/L Final         Failed - Lipid Panel in normal range within the last 12 months    Cholesterol, Total  Date Value Ref Range Status  09/07/2022 225 (H) 100 - 199 mg/dL Final   LDL Chol Calc (NIH)  Date Value Ref Range Status  09/07/2022 118 (H) 0 - 99 mg/dL Final   HDL  Date Value Ref Range Status  09/07/2022 68 >39 mg/dL Final   Triglycerides  Date Value Ref Range Status  09/07/2022 227 (H) 0 - 149 mg/dL Final         Passed - Patient is not pregnant      Passed - Valid encounter within last 12 months    Recent Outpatient Visits           2 months ago Type II diabetes mellitus with complication (HCC)   [place] [facility] at [facility], Nyoka Cowden, MD   7 months ago Type II diabetes mellitus with complication [facility])   [place] [facility] Care & Sports Medicine at [facility], Nyoka Cowden, MD   12 months ago Preop examination   [facility] at [facility], Nyoka Cowden, MD   1 year ago Type II diabetes mellitus with complication [facility])   [place] Primary Care & Sports Medicine at [facility], Nyoka Cowden, MD   1 year ago Type II diabetes  mellitus with complication [facility])   [place] [facility] Care & Sports Medicine at [facility], Nyoka Cowden, MD       Future Appointments             In 3 months Judithann Graves, Nyoka Cowden, MD [facility] Primary Care & Sports Medicine at [facility], [facility]

## 2023-05-16 ENCOUNTER — Other Ambulatory Visit: Payer: Self-pay | Admitting: Internal Medicine

## 2023-05-16 DIAGNOSIS — M62838 Other muscle spasm: Secondary | ICD-10-CM

## 2023-05-16 DIAGNOSIS — M5412 Radiculopathy, cervical region: Secondary | ICD-10-CM

## 2023-05-16 DIAGNOSIS — F3341 Major depressive disorder, recurrent, in partial remission: Secondary | ICD-10-CM

## 2023-05-17 NOTE — Telephone Encounter (Signed)
 Requested medication (s) are due for refill today: yes  Requested medication (s) are on the active medication list: yes  Last refill:  02/24/23  Future visit scheduled: yes  Notes to clinic:  Unable to refill per protocol, cannot delegate.      Requested Prescriptions  Pending Prescriptions Disp Refills   cyclobenzaprine (FLEXERIL) 10 MG tablet [Pharmacy Med Name: CYCLOBENZAPRINE 10 MG TABLET] 30 tablet 0    Sig: TAKE 1 TABLET BY MOUTH AT BEDTIME     Not Delegated - Analgesics:  Muscle Relaxants Failed - 05/17/2023  3:56 PM      Failed - This refill cannot be delegated      Passed - Valid encounter within last 6 months    Recent Outpatient Visits           2 months ago Type II diabetes mellitus with complication [facility])   [place] [facility] at [facility], Anthony Cowden, MD   8 months ago Type II diabetes mellitus with complication [facility])   [place] [facility] Care & Sports Medicine at [facility], Anthony Cowden, MD   1 year ago Preop examination   [place] [facility] Care [facility] Sports Medicine at [facility], Anthony Cowden, MD   1 year ago Type II diabetes mellitus with complication [facility])   [place] Primary Care & Sports Medicine at [facility], Anthony Cowden, MD   1 year ago Type II diabetes mellitus with complication [facility])   [place] [facility] Sports Medicine at [facility], Anthony Cowden, MD       Future Appointments             In 3 months Anthony Cohen Anthony Cowden, MD [facility] Primary Care & Sports Medicine at [facility], [facility]            Signed Prescriptions Disp Refills   venlafaxine XR (EFFEXOR-XR) 75 MG 24 hr capsule 270 capsule 0    Sig: TAKE THREE CAPSULES BY MOUTH DAILY WITH BREAKFAST     Psychiatry: Antidepressants - SNRI - desvenlafaxine & venlafaxine Failed - 05/17/2023  3:56 PM      Failed - Lipid Panel in normal range within the last 12 months    Cholesterol, Total  Date Value  Ref Range Status  09/07/2022 225 (H) 100 - 199 mg/dL Final   LDL Chol Calc (NIH)  Date Value Ref Range Status  09/07/2022 118 (H) 0 - 99 mg/dL Final   HDL  Date Value Ref Range Status  09/07/2022 68 >39 mg/dL Final   Triglycerides  Date Value Ref Range Status  09/07/2022 227 (H) 0 - 149 mg/dL Final         Passed - Cr in normal range and within 360 days    Creatinine, Ser  Date Value Ref Range Status  09/07/2022 0.93 0.76 - 1.27 mg/dL Final         Passed - Completed PHQ-2 or PHQ-9 in the last 360 days      Passed - Last BP in normal range    BP Readings from Last 1 Encounters:  02/24/23 124/76         Passed - Valid encounter within last 6 months    Recent Outpatient Visits           2 months ago Type II diabetes mellitus with complication [facility])    Primary Care & Sports Medicine at [facility], Anthony Cowden, MD  8 months ago Type II diabetes mellitus with complication [facility])   [place] [facility] Care & Sports Medicine at [facility], Anthony Cowden, MD   1 year ago Preop examination   [facility] at [facility], Anthony Cowden, MD   1 year ago Type II diabetes mellitus with complication [facility])   [place] [facility] & Sports Medicine at [facility], Anthony Cowden, MD   1 year ago Type II diabetes mellitus with complication [facility])   [place] Primary Care & Sports Medicine at [facility], Anthony Cowden, MD       Future Appointments             In 3 months Anthony Cohen Anthony Cowden, MD [facility] Primary Care & Sports Medicine at [facility]             gabapentin (NEURONTIN) 400 MG capsule 180 capsule 0    Sig: TAKE 1 CAPSULE BY MOUTH TWICE A DAY     Neurology: Anticonvulsants - gabapentin Passed - 05/17/2023  3:56 PM      Passed - Cr in normal range and within 360 days    Creatinine, Ser  Date Value Ref Range Status  09/07/2022 0.93 0.76 - 1.27 mg/dL Final          Passed - Completed PHQ-2 or PHQ-9 in the last 360 days      Passed - Valid encounter within last 12 months    Recent Outpatient Visits           2 months ago Type II diabetes mellitus with complication [facility])   [place] [facility] Care & Sports Medicine at [facility], Anthony Cowden, MD   8 months ago Type II diabetes mellitus with complication [facility])   [place] Primary Care & Sports Medicine at [facility], Anthony Cowden, MD   1 year ago Preop examination   [facility] Primary [facility] & Sports [facility] at [facility], Anthony Cowden, MD   1 year ago Type II diabetes mellitus with complication [facility])   [place] Primary Care & Sports Medicine at [facility], Anthony Cowden, MD   1 year ago Type II diabetes mellitus with complication [facility])    Primary Care & Sports Medicine at [facility], Anthony Cowden, MD       Future Appointments             In 3 months Anthony Cohen, Anthony Cowden, MD [facility] Primary Care & Sports Medicine at [facility], [facility]

## 2023-05-17 NOTE — Telephone Encounter (Signed)
 Requested Prescriptions  Pending Prescriptions Disp Refills   cyclobenzaprine (FLEXERIL) 10 MG tablet [Pharmacy Med Name: CYCLOBENZAPRINE 10 MG TABLET] 30 tablet 0    Sig: TAKE 1 TABLET BY MOUTH AT BEDTIME     Not Delegated - Analgesics:  Muscle Relaxants Failed - 05/17/2023  3:40 PM      Failed - This refill cannot be delegated      Passed - Valid encounter within last 6 months    Recent Outpatient Visits           2 months ago Type II diabetes mellitus with complication [facility])   [place] Primary Care & Sports Medicine at [facility], Nyoka Cowden, MD   8 months ago Type II diabetes mellitus with complication [facility])   [place] Primary Care & Sports Medicine at [facility], Nyoka Cowden, MD   1 year ago Preop examination   [place] Primary Care & Sports Medicine at [facility], Nyoka Cowden, MD   1 year ago Type II diabetes mellitus with complication [facility])   [place] Primary Care & Sports Medicine at [facility], Nyoka Cowden, MD   1 year ago Type II diabetes mellitus with complication [facility])   [place] Primary Care & Sports Medicine at [facility], Nyoka Cowden, MD       Future Appointments             In 3 months Judithann Graves Nyoka Cowden, MD [facility] Primary Care & Sports Medicine at [facility], [facility]             venlafaxine XR (EFFEXOR-XR) 75 MG 24 hr capsule [Pharmacy Med Name: VENLAFAXINE HCL ER 75 MG CAP] 270 capsule 0    Sig: TAKE THREE CAPSULES BY MOUTH DAILY WITH BREAKFAST     Psychiatry: Antidepressants - SNRI - desvenlafaxine & venlafaxine Failed - 05/17/2023  3:40 PM      Failed - Lipid Panel in normal range within the last 12 months    Cholesterol, Total  Date Value Ref Range Status  09/07/2022 225 (H) 100 - 199 mg/dL Final   LDL Chol Calc (NIH)  Date Value Ref Range Status  09/07/2022 118 (H) 0 - 99 mg/dL Final   HDL  Date Value Ref Range Status  09/07/2022 68 >39 mg/dL Final   Triglycerides   Date Value Ref Range Status  09/07/2022 227 (H) 0 - 149 mg/dL Final         Passed - Cr in normal range and within 360 days    Creatinine, Ser  Date Value Ref Range Status  09/07/2022 0.93 0.76 - 1.27 mg/dL Final         Passed - Completed PHQ-2 or PHQ-9 in the last 360 days      Passed - Last BP in normal range    BP Readings from Last 1 Encounters:  02/24/23 124/76         Passed - Valid encounter within last 6 months    Recent Outpatient Visits           2 months ago Type II diabetes mellitus with complication [facility])    Primary Care & Sports Medicine at [facility] Nyoka Cowden, MD   8 months ago Type II diabetes mellitus with complication [facility])    Primary Care & Sports Medicine at [place] [facility], Nyoka Cowden, MD   1 year ago Preop examination   [facility] Primary [facility] & Sports Medicine  at [facility], Nyoka Cowden, MD   1 year ago Type II diabetes mellitus with complication [facility])   [place] Primary Care & Sports Medicine at [facility], Nyoka Cowden, MD   1 year ago Type II diabetes mellitus with complication [facility])   [place] Primary Care & Sports Medicine at [facility], Nyoka Cowden, MD       Future Appointments             In 3 months Judithann Graves, Nyoka Cowden, MD [facility] Primary Care & Sports Medicine at [facility], [facility]             gabapentin (NEURONTIN) 400 MG capsule [Pharmacy Med Name: GABAPENTIN 400 MG CAPSULE] 180 capsule 0    Sig: TAKE 1 CAPSULE BY MOUTH TWICE A DAY     Neurology: Anticonvulsants - gabapentin Passed - 05/17/2023  3:40 PM      Passed - Cr in normal range and within 360 days    Creatinine, Ser  Date Value Ref Range Status  09/07/2022 0.93 0.76 - 1.27 mg/dL Final         Passed - Completed PHQ-2 or PHQ-9 in the last 360 days      Passed - Valid encounter within last 12 months    Recent Outpatient Visits           2 months ago Type II diabetes mellitus  with complication [place])   [place] Primary Care & Sports Medicine at [facility], Nyoka Cowden, MD   8 months ago Type II diabetes mellitus with complication [facility])   [place] [facility] Care & Sports Medicine at [facility], Nyoka Cowden, MD   1 year ago Preop examination   [facility] Sports Medicine at [facility], Nyoka Cowden, MD   1 year ago Type II diabetes mellitus with complication [facility])   [place] Primary Care & Sports Medicine at [facility], Nyoka Cowden, MD   1 year ago Type II diabetes mellitus with complication [facility])   [place] Primary Care & Sports Medicine at [facility], Nyoka Cowden, MD       Future Appointments             In 3 months Judithann Graves, Nyoka Cowden, MD [facility] Primary Care & Sports Medicine at [facility], [facility]

## 2023-06-07 ENCOUNTER — Other Ambulatory Visit: Payer: Self-pay | Admitting: Internal Medicine

## 2023-06-07 DIAGNOSIS — F3341 Major depressive disorder, recurrent, in partial remission: Secondary | ICD-10-CM

## 2023-06-08 NOTE — Telephone Encounter (Signed)
 Requested Prescriptions  Pending Prescriptions Disp Refills   buPROPion (WELLBUTRIN XL) 150 MG 24 hr tablet [Pharmacy Med Name: buPROPion HCL XL 150 MG TABLET] 270 tablet 0    Sig: TAKE THREE TABLETS BY MOUTH DAILY     Psychiatry: Antidepressants - bupropion Failed - 06/08/2023 11:04 AM      Failed - AST in normal range and within 360 days    AST  Date Value Ref Range Status  03/08/2022 34 0 - 40 IU/L Final         Failed - ALT in normal range and within 360 days    ALT  Date Value Ref Range Status  03/08/2022 47 (H) 0 - 44 IU/L Final         Passed - Cr in normal range and within 360 days    Creatinine, Ser  Date Value Ref Range Status  09/07/2022 0.93 0.76 - 1.27 mg/dL Final         Passed - Completed PHQ-2 or PHQ-9 in the last 360 days      Passed - Last BP in normal range    BP Readings from Last 1 Encounters:  02/24/23 124/76         Passed - Valid encounter within last 6 months    Recent Outpatient Visits           3 months ago Type II diabetes mellitus with complication [facility])   [place] Primary Care & Sports Medicine at [facility], Nyoka Cowden, MD   9 months ago Type II diabetes mellitus with complication [facility])   [place] Primary Care & Sports Medicine at [facility], Nyoka Cowden, MD   1 year ago Preop examination   [facility] Primary Care & Sports Medicine at [facility], Nyoka Cowden, MD   1 year ago Type II diabetes mellitus with complication [facility])   [place] Primary Care & Sports Medicine at [facility], Nyoka Cowden, MD   1 year ago Type II diabetes mellitus with complication [facility])   [place] Primary Care & Sports Medicine at [facility], Nyoka Cowden, MD       Future Appointments             In 2 months Judithann Graves, Nyoka Cowden, MD [facility] Primary Care & Sports Medicine at [facility], [facility]

## 2023-06-15 ENCOUNTER — Ambulatory Visit

## 2023-06-15 DIAGNOSIS — Z1211 Encounter for screening for malignant neoplasm of colon: Secondary | ICD-10-CM

## 2023-06-15 DIAGNOSIS — Z0000 Encounter for general adult medical examination without abnormal findings: Secondary | ICD-10-CM | POA: Diagnosis not present

## 2023-06-15 NOTE — Patient Instructions (Addendum)
 Mr. Anthony Cohen , Thank you for taking time to come for your Medicare Wellness Visit. I appreciate your ongoing commitment to your health goals. Please review the following plan we discussed and let me know if I can assist you in the future.   Referrals/Orders/Follow-Ups/Clinician Recommendations: REFERRAL SENT  FOR COLONOSCOPY  This is a list of the screening recommended for you and due dates:  Health Maintenance  Topic Date Due   Pneumococcal Vaccination (1 of 2 - PCV) Never done   HIV Screening  Never done   DTaP/Tdap/Td vaccine (1 - Tdap) Never done   Zoster (Shingles) Vaccine (1 of 2) Never done   COVID-19 Vaccine (4 - 2024-25 season) 11/21/2022   Complete foot exam   05/06/2023   Colon Cancer Screening  10/03/2023   Flu Shot  06/20/2023*   Hemoglobin A1C  08/25/2023   Yearly kidney function blood test for diabetes  09/07/2023   Eye exam for diabetics  11/01/2023   Yearly kidney health urinalysis for diabetes  02/24/2024   Medicare Annual Wellness Visit  06/14/2024   Hepatitis C Screening  Completed   HPV Vaccine  Aged Out  *Topic was postponed. The date shown is not the original due date.    Advanced directives: (ACP Link)Information on Advanced Care Planning can be found at [facility] [place] Advance Health Care Directives Advance Health Care Directives. [email or web address]   Next Medicare Annual Wellness Visit scheduled for next year: Yes   06/20/24 @ 11:30 AM BY PHONE

## 2023-06-15 NOTE — Progress Notes (Signed)
 Subjective:   Anthony Cohen is a 63 y.o. who presents for a Medicare Wellness preventive visit.  Visit Complete: Virtual I connected with  [name] on 06/15/23 by a audio enabled telemedicine application and verified that I am speaking with the correct person using two identifiers.  Patient Location: Home  Provider Location: Office/Clinic  I discussed the limitations of evaluation and management by telemedicine. The patient expressed understanding and agreed to proceed.  Vital Signs: Because this visit was a virtual/telehealth visit, some criteria may be missing or patient reported. Any vitals not documented were not able to be obtained and vitals that have been documented are patient reported.  VideoDeclined- This patient declined Librarian, academic. Therefore the visit was completed with audio only.  Persons Participating in Visit: Patient.  AWV Questionnaire: No: Patient Medicare AWV questionnaire was not completed prior to this visit.  Cardiac Risk Factors include: advanced age (>36men, >29 women);dyslipidemia;male gender;obesity (BMI >30kg/m2);sedentary lifestyle     Objective:    Today's Vitals   06/15/23 1359  PainSc: 4    There is no height or weight on file to calculate BMI.     06/15/2023    2:05 PM 05/27/2022    1:40 PM 05/06/2022   10:10 AM 11/22/2021   12:28 PM 10/27/2021    9:50 AM 09/21/2019    9:55 AM 04/20/2016   10:47 AM  Advanced Directives  Does Patient Have a Medical Advance Directive? No No No Yes No Yes No;Yes  Type of Advance Directive    Living will   Living will  Would patient like information on creating a medical advance directive? No - Patient declined  No - Patient declined        Current Medications (verified) Outpatient Encounter Medications as of 06/15/2023  Medication Sig   buPROPion (WELLBUTRIN XL) 150 MG 24 hr tablet TAKE THREE TABLETS BY MOUTH DAILY   ezetimibe (ZETIA) 10 MG tablet TAKE 1 TABLET BY  MOUTH DAILY   famotidine (PEPCID) 20 MG tablet Take by mouth.   gabapentin (NEURONTIN) 400 MG capsule TAKE 1 CAPSULE BY MOUTH TWICE A DAY   Multiple Vitamin (MULTI-VITAMIN) tablet Take 1 tablet by mouth daily.   venlafaxine XR (EFFEXOR-XR) 75 MG 24 hr capsule TAKE THREE CAPSULES BY MOUTH DAILY WITH BREAKFAST   cyclobenzaprine (FLEXERIL) 10 MG tablet TAKE 1 TABLET BY MOUTH AT BEDTIME (Patient not taking: Reported on 06/15/2023)   Misc Natural Products (PROSTATE) CAPS Take by mouth. (Patient not taking: Reported on 06/15/2023)   No facility-administered encounter medications on file as of 06/15/2023.    Allergies (verified) Patient has no known allergies.   History: Past Medical History:  Diagnosis Date   Hypercholesteremia    Past Surgical History:  Procedure Laterality Date   FOOT ARTHRODESIS, TRIPLE Left 06/18/2022   HIP ARTHROPLASTY Right 11/2021   Family History  Problem Relation Age of Onset   Hypertension Mother    Breast cancer Mother    Social History   Socioeconomic History   Marital status: Married    Spouse name: Not on file   Number of children: Not on file   Years of education: Not on file   Highest education level: GED or equivalent  Occupational History   Occupation: disabled    Comment: started 04/2018  Tobacco Use   Smoking status: Former    Current packs/day: 0.00    Average packs/day: 1 pack/day for 3.0 years (3.0 ttl pk-yrs)    Types:  Cigarettes    Start date: 03/28/2015    Quit date: 03/29/2015    Years since quitting: 8.2   Smokeless tobacco: Never  Vaping Use   Vaping status: Never Used  Substance and Sexual Activity   Alcohol use: Yes    Alcohol/week: 0.0 standard drinks of alcohol   Drug use: No   Sexual activity: Not on file  Other Topics Concern   Not on file  Social History Narrative   Not on file   Social Drivers of Health   Financial Resource Strain: Low Risk  (06/15/2023)   Overall Financial Resource Strain (CARDIA)    Difficulty  of Paying Living Expenses: Not hard at all  Food Insecurity: No Food Insecurity (06/15/2023)   Hunger Vital Sign    Worried About Running Out of Food in the Last Year: Never true    Ran Out of Food in the Last Year: Never true  Transportation Needs: No Transportation Needs (06/15/2023)   PRAPARE - Administrator, Civil Service (Medical): No    Lack of Transportation (Non-Medical): No  Physical Activity: Inactive (06/15/2023)   Exercise Vital Sign    Days of Exercise per Week: 0 days    Minutes of Exercise per Session: 0 min  Stress: No Stress Concern Present (06/15/2023)   [employer] of Occupational Health - Occupational Stress Questionnaire    Feeling of Stress : Only a little  Social Connections: Moderately Isolated (06/15/2023)   Social Connection and Isolation Panel [NHANES]    Frequency of Communication with Friends and Family: Twice a week    Frequency of Social Gatherings with Friends and Family: Once a week    Attends Religious Services: Never    Database administrator or Organizations: No    Attends Engineer, structural: Never    Marital Status: Married    Tobacco Counseling Counseling given: Not Answered    Clinical Intake:  Pre-visit preparation completed: Yes  Pain : 0-10 Pain Score: 4  Pain Type: Chronic pain Pain Location: Foot Pain Orientation: Left Pain Radiating Towards: waiting for surgery to reconstruct left foot Pain Descriptors / Indicators: Aching, Constant, Tender Pain Onset: More than a month ago Pain Frequency: Constant Pain Relieving Factors: ice sometimes  Pain Relieving Factors: ice sometimes  BMI - recorded: 31.9 Nutritional Status: BMI > 30  Obese Nutritional Risks: None Diabetes: No  Lab Results  Component Value Date   HGBA1C 5.8 (H) 02/24/2023   HGBA1C 6.0 (H) 09/07/2022   HGBA1C 6.2 (H) 03/08/2022     How often do you need to have someone help you when you read instructions, pamphlets, or other written  materials from your doctor or pharmacy?: 1 - Never  Interpreter Needed?: No  Information entered by :: Kennedy Bucker, LPN   Activities of Daily Living    06/15/2023    2:06 PM  In your present state of health, do you have any difficulty performing the following activities:  Hearing? 0  Vision? 0  Difficulty concentrating or making decisions? 0  Walking or climbing stairs? 1  Comment FOOT PAIN  Dressing or bathing? 0  Doing errands, shopping? 0  Preparing Food and eating ? N  Using the Toilet? N  In the past six months, have you accidently leaked urine? N  Do you have problems with loss of bowel control? N  Managing your Medications? N  Managing your Finances? N  Housekeeping or managing your Housekeeping? N    Patient Care Team:  Reubin Milan, MD as PCP - General (Internal Medicine) Lenor Coffin, RN as Registered Nurse (Psychiatry) Shayne Alken Rueben Bash, MD as Referring Physician (Pain Medicine) Pa, [facility]) Brooke Dare, Earl Gala, MD as Consulting Physician (Ophthalmology)  Indicate any recent Medical Services you may have received from other than [facility] providers in the past year (date may be approximate).     Assessment:   This is a routine wellness examination for Coleman.  Hearing/Vision screen Hearing Screening - Comments:: NO AIDS Vision Screening - Comments:: NO GLASSES- [facility] [place]   Goals Addressed             This Visit's Progress    DIET - INCREASE WATER INTAKE         Depression Screen     06/15/2023    2:03 PM 02/24/2023   10:19 AM 09/07/2022   10:53 AM 05/06/2022   10:09 AM 05/05/2022    9:49 AM 03/08/2022   11:02 AM 11/05/2021   10:30 AM  PHQ 2/9 Scores  PHQ - 2 Score 0 0 0 0 0 0 0  PHQ- 9 Score 0 1 2 0 1 0 0    Fall Risk     06/15/2023    2:06 PM 02/24/2023   10:19 AM 09/07/2022   10:53 AM 05/06/2022   10:11 AM 05/05/2022    9:49 AM  Fall Risk   Falls in the past year? 0 0 0 0 0  Number falls in past yr: 0 0  0 0 0  Injury with Fall? 0 0 0 0 0  Risk for fall due to : No Fall Risks No Fall Risks No Fall Risks No Fall Risks No Fall Risks  Follow up Falls prevention discussed;Falls evaluation completed Falls evaluation completed Falls evaluation completed Falls prevention discussed;Falls evaluation completed Falls evaluation completed    MEDICARE RISK AT HOME:  Medicare Risk at Home Any stairs in or around the home?: Yes If so, are there any without handrails?: No Home free of loose throw rugs in walkways, pet beds, electrical cords, etc?: Yes Adequate lighting in your home to reduce risk of falls?: Yes Life alert?: No Use of a cane, walker or w/c?: Yes (WALKER W/ FOOT SURGERY) Grab bars in the bathroom?: No Shower chair or bench in shower?: Yes Elevated toilet seat or a handicapped toilet?: No  TIMED UP AND GO:  Was the test performed?  No  Cognitive Function: 6CIT completed        06/15/2023    2:08 PM 05/06/2022   10:12 AM  6CIT Screen  What Year? 0 points 0 points  What month? 0 points 0 points  What time? 0 points 0 points  Count back from 20 0 points 0 points  Months in reverse 0 points 0 points  Repeat phrase 0 points 0 points  Total Score 0 points 0 points    Immunizations Immunization History  Administered Date(s) Administered   PFIZER Comirnaty(Gray Top)Covid-19 Tri-Sucrose Vaccine 05/21/2019, 06/10/2019, 02/20/2020    Screening Tests Health Maintenance  Topic Date Due   Pneumococcal Vaccine 32-37 Years old (1 of 2 - PCV) Never done   HIV Screening  Never done   DTaP/Tdap/Td (1 - Tdap) Never done   Zoster Vaccines- Shingrix (1 of 2) Never done   COVID-19 Vaccine (4 - 2024-25 season) 11/21/2022   FOOT EXAM  05/06/2023   Colonoscopy  10/03/2023   INFLUENZA VACCINE  06/20/2023 (Originally 10/21/2022)   HEMOGLOBIN A1C  08/25/2023   Diabetic kidney evaluation - eGFR measurement  09/07/2023   OPHTHALMOLOGY EXAM  11/01/2023   Diabetic kidney evaluation - Urine ACR   02/24/2024   Medicare Annual Wellness (AWV)  06/14/2024   Hepatitis C Screening  Completed   HPV VACCINES  Aged Out    Health Maintenance  Health Maintenance Due  Topic Date Due   Pneumococcal Vaccine 51-60 Years old (1 of 2 - PCV) Never done   HIV Screening  Never done   DTaP/Tdap/Td (1 - Tdap) Never done   Zoster Vaccines- Shingrix (1 of 2) Never done   COVID-19 Vaccine (4 - 2024-25 season) 11/21/2022   FOOT EXAM  05/06/2023   Colonoscopy  10/03/2023   Health Maintenance Items Addressed: Referral sent to GI for colonoscopy  Additional Screening:  Vision Screening: Recommended annual ophthalmology exams for early detection of glaucoma and other disorders of the eye.  Dental Screening: Recommended annual dental exams for proper oral hygiene  Community Resource Referral / Chronic Care Management: CRR required this visit?  No   CCM required this visit?  No     Plan:     I have personally reviewed and noted the following in the patient's chart:   Medical and social history Use of alcohol, tobacco or illicit drugs  Current medications and supplements including opioid prescriptions. Patient is not currently taking opioid prescriptions. Functional ability and status Nutritional status Physical activity Advanced directives List of other physicians Hospitalizations, surgeries, and ER visits in previous 12 months Vitals Screenings to include cognitive, depression, and falls Referrals and appointments  In addition, I have reviewed and discussed with patient certain preventive protocols, quality metrics, and best practice recommendations. A written personalized care plan for preventive services as well as general preventive health recommendations were provided to patient.     Hal Hope, LPN   1/61/0960   After Visit Summary: (MyChart) Due to this being a telephonic visit, the after visit summary with patients personalized plan was offered to patient via MyChart    Notes:  REFERRAL FOR COLONOSCOPY SENT

## 2023-06-16 ENCOUNTER — Telehealth: Payer: Self-pay

## 2023-06-16 ENCOUNTER — Other Ambulatory Visit: Payer: Self-pay

## 2023-06-16 DIAGNOSIS — Z1211 Encounter for screening for malignant neoplasm of colon: Secondary | ICD-10-CM

## 2023-06-16 MED ORDER — NA SULFATE-K SULFATE-MG SULF 17.5-3.13-1.6 GM/177ML PO SOLN: 1.0000 | Freq: Once | ORAL | 0 refills | Status: AC

## 2023-06-16 NOTE — Telephone Encounter (Signed)
 Gastroenterology Pre-Procedure Review  Request Date: 07/22/23 Requesting Physician: Dr. Allegra Lai  PATIENT REVIEW QUESTIONS: The patient responded to the following health history questions as indicated:    1. Are you having any GI issues? no 2. Do you have a personal history of Polyps? Pt's last colonoscopy October 02, 2013 noted tubular adenoma repeat in 10 years did not see actual colonoscopy or pathology 3. Do you have a family history of Colon Cancer or Polyps? no 4. Diabetes Mellitus? yes (controlled with diet) 5. Joint replacements in the past 12 months? Right Hip Replacement Sept 2023, Left Foot Reconstruction surgery March 2024 6. Major health problems in the past 3 months?no 7. Any artificial heart valves, MVP, or defibrillator?no    MEDICATIONS & ALLERGIES:    Patient reports the following regarding taking any anticoagulation/antiplatelet therapy:   Plavix, Coumadin, Eliquis, Xarelto, Lovenox, Pradaxa, Brilinta, or Effient? no Aspirin? no  Patient confirms/reports the following medications:  Current Outpatient Medications  Medication Sig Dispense Refill   buPROPion (WELLBUTRIN XL) 150 MG 24 hr tablet TAKE THREE TABLETS BY MOUTH DAILY 270 tablet 0   cyclobenzaprine (FLEXERIL) 10 MG tablet TAKE 1 TABLET BY MOUTH AT BEDTIME (Patient not taking: Reported on 06/15/2023) 30 tablet 0   ezetimibe (ZETIA) 10 MG tablet TAKE 1 TABLET BY MOUTH DAILY 90 tablet 0   famotidine (PEPCID) 20 MG tablet Take by mouth.     gabapentin (NEURONTIN) 400 MG capsule TAKE 1 CAPSULE BY MOUTH TWICE A DAY 180 capsule 0   Misc Natural Products (PROSTATE) CAPS Take by mouth. (Patient not taking: Reported on 06/15/2023)     Multiple Vitamin (MULTI-VITAMIN) tablet Take 1 tablet by mouth daily.     venlafaxine XR (EFFEXOR-XR) 75 MG 24 hr capsule TAKE THREE CAPSULES BY MOUTH DAILY WITH BREAKFAST 270 capsule 0   No current facility-administered medications for this visit.    Patient confirms/reports the following  allergies:  No Known Allergies  No orders of the defined types were placed in this encounter.   AUTHORIZATION INFORMATION Primary Insurance: 1D#: Group #:  Secondary Insurance: 1D#: Group #:  SCHEDULE INFORMATION: Date: 07/22/23 Time: Location: [facility]

## 2023-06-23 ENCOUNTER — Telehealth: Payer: Self-pay

## 2023-06-23 NOTE — Telephone Encounter (Signed)
 Pt requesting call back to cancel procedure for 07/22/2023 colonoscopy and reschedule

## 2023-06-24 NOTE — Telephone Encounter (Signed)
 Patient call has been returned.  He has requested to cancel his 07/22/23 colonoscopy due to planned foot surgery same day.  He plans to reschedule at a later date.  Vikkie in Endo has been asked to cancel procedure for now.  Thanks, [place], [place]

## 2023-06-30 ENCOUNTER — Encounter: Payer: Self-pay | Admitting: Orthopedic Surgery

## 2023-07-08 ENCOUNTER — Ambulatory Visit

## 2023-07-12 ENCOUNTER — Ambulatory Visit
Admission: RE | Admit: 2023-07-12 | Discharge: 2023-07-12 | Disposition: A | Discharge status: Home / Self Care | Source: Ambulatory Visit | Attending: Orthopedic Surgery | Admitting: Orthopedic Surgery

## 2023-07-12 DIAGNOSIS — I498 Other specified cardiac arrhythmias: Secondary | ICD-10-CM | POA: Diagnosis not present

## 2023-07-12 DIAGNOSIS — Z01810 Encounter for preprocedural cardiovascular examination: Secondary | ICD-10-CM | POA: Diagnosis present

## 2023-07-22 ENCOUNTER — Ambulatory Visit: Admit: 2023-07-22 | Admitting: Gastroenterology

## 2023-07-22 SURGERY — COLONOSCOPY
Anesthesia: General

## 2023-07-30 ENCOUNTER — Other Ambulatory Visit: Payer: Self-pay | Admitting: Internal Medicine

## 2023-07-30 DIAGNOSIS — E1169 Type 2 diabetes mellitus with other specified complication: Secondary | ICD-10-CM

## 2023-08-01 NOTE — Telephone Encounter (Signed)
 Requested medications are due for refill today.  yes  Requested medications are on the active medications list.  yes  Last refill. 05/04/2023 #90 0 rf  Future visit scheduled.   yes  Notes to clinic.  Expired labs.    Requested Prescriptions  Pending Prescriptions Disp Refills   ezetimibe  (ZETIA ) 10 MG tablet [Pharmacy Med Name: EZETIMIBE  10 MG TABLET] 90 tablet 0    Sig: TAKE 1 TABLET BY MOUTH DAILY     Cardiovascular:  Antilipid - Sterol Transport Inhibitors Failed - 08/01/2023  4:26 PM      Failed - AST in normal range and within 360 days    AST  Date Value Ref Range Status  03/08/2022 34 0 - 40 IU/L Final         Failed - ALT in normal range and within 360 days    ALT  Date Value Ref Range Status  03/08/2022 47 (H) 0 - 44 IU/L Final         Failed - Valid encounter within last 12 months    Recent Outpatient Visits   None     Future Appointments             In 1 month Sheron Dixons, MD [facility] Primary Care & Sports Medicine at [facility], [facility]            Failed - Lipid Panel in normal range within the last 12 months    Cholesterol, Total  Date Value Ref Range Status  09/07/2022 225 (H) 100 - 199 mg/dL Final   LDL Chol Calc (NIH)  Date Value Ref Range Status  09/07/2022 118 (H) 0 - 99 mg/dL Final   HDL  Date Value Ref Range Status  09/07/2022 68 >39 mg/dL Final   Triglycerides  Date Value Ref Range Status  09/07/2022 227 (H) 0 - 149 mg/dL Final         Passed - Patient is not pregnant

## 2023-08-12 ENCOUNTER — Other Ambulatory Visit: Payer: Self-pay | Admitting: Internal Medicine

## 2023-08-12 DIAGNOSIS — F3341 Major depressive disorder, recurrent, in partial remission: Secondary | ICD-10-CM

## 2023-08-16 NOTE — Telephone Encounter (Signed)
 Requested Prescriptions  Pending Prescriptions Disp Refills   venlafaxine  XR (EFFEXOR -XR) 75 MG 24 hr capsule [Pharmacy Med Name: VENLAFAXINE  HCL ER 75 MG CAP] 270 capsule 0    Sig: TAKE 3 CAPSULES BY MOUTH EVERY MORNING WITH BREAKFAST     Psychiatry: Antidepressants - SNRI - desvenlafaxine & venlafaxine  Failed - 08/16/2023 10:47 AM      Failed - Valid encounter within last 6 months    Recent Outpatient Visits   None     Future Appointments             In 3 weeks Anthony Dixons, MD [facility] Primary Care [facility] Sports Medicine at [facility], [facility]            Failed - Lipid Panel in normal range within the last 12 months    Cholesterol, Total  Date Value Ref Range Status  09/07/2022 225 (H) 100 - 199 mg/dL Final   LDL Chol Calc (NIH)  Date Value Ref Range Status  09/07/2022 118 (H) 0 - 99 mg/dL Final   HDL  Date Value Ref Range Status  09/07/2022 68 >39 mg/dL Final   Triglycerides  Date Value Ref Range Status  09/07/2022 227 (H) 0 - 149 mg/dL Final         Passed - Cr in normal range and within 360 days    Creatinine, Ser  Date Value Ref Range Status  09/07/2022 0.93 0.76 - 1.27 mg/dL Final         Passed - Completed PHQ-2 or PHQ-9 in the last 360 days      Passed - Last BP in normal range    BP Readings from Last 1 Encounters:  02/24/23 124/76

## 2023-08-25 ENCOUNTER — Encounter: Payer: Self-pay | Admitting: Internal Medicine

## 2023-09-03 ENCOUNTER — Other Ambulatory Visit: Payer: Self-pay | Admitting: Internal Medicine

## 2023-09-03 DIAGNOSIS — F3341 Major depressive disorder, recurrent, in partial remission: Secondary | ICD-10-CM

## 2023-09-05 NOTE — Telephone Encounter (Signed)
 Requested medications are due for refill today.  yes  Requested medications are on the active medications list.  yes  Last refill. 06/08/2023 #270 0 rf  Future visit scheduled.   yes  Notes to clinic.  Labs are expired.    Requested Prescriptions  Pending Prescriptions Disp Refills   buPROPion  (WELLBUTRIN  XL) 150 MG 24 hr tablet [Pharmacy Med Name: buPROPion  HCL XL 150 MG TABLET] 270 tablet 0    Sig: TAKE 3 TABLETS BY MOUTH DAILY     Psychiatry: Antidepressants - bupropion  Failed - 09/05/2023  5:54 PM      Failed - Cr in normal range and within 360 days    Creatinine, Ser  Date Value Ref Range Status  09/07/2022 0.93 0.76 - 1.27 mg/dL Final         Failed - AST in normal range and within 360 days    AST  Date Value Ref Range Status  03/08/2022 34 0 - 40 IU/L Final         Failed - ALT in normal range and within 360 days    ALT  Date Value Ref Range Status  03/08/2022 47 (H) 0 - 44 IU/L Final         Failed - Valid encounter within last 6 months    Recent Outpatient Visits   None     Future Appointments             In 4 days Sheron Dixons, MD [facility] Primary Care [facility] Sports Medicine at [facility], [facility]            Passed - Completed PHQ-2 or PHQ-9 in the last 360 days      Passed - Last BP in normal range    BP Readings from Last 1 Encounters:  02/24/23 124/76

## 2023-09-09 ENCOUNTER — Ambulatory Visit (INDEPENDENT_AMBULATORY_CARE_PROVIDER_SITE_OTHER): Admitting: Internal Medicine

## 2023-09-09 VITALS — BP 144/76 | HR 100 | Ht 72.0 in | Wt 226.0 lb

## 2023-09-09 DIAGNOSIS — E118 Type 2 diabetes mellitus with unspecified complications: Secondary | ICD-10-CM

## 2023-09-09 DIAGNOSIS — F3341 Major depressive disorder, recurrent, in partial remission: Secondary | ICD-10-CM | POA: Diagnosis not present

## 2023-09-09 DIAGNOSIS — K219 Gastro-esophageal reflux disease without esophagitis: Secondary | ICD-10-CM | POA: Diagnosis not present

## 2023-09-09 DIAGNOSIS — E782 Mixed hyperlipidemia: Secondary | ICD-10-CM

## 2023-09-09 DIAGNOSIS — G4733 Obstructive sleep apnea (adult) (pediatric): Secondary | ICD-10-CM

## 2023-09-09 MED ORDER — BUPROPION HCL ER (XL) 150 MG PO TB24: 450.0000 mg | ORAL_TABLET | Freq: Every day | ORAL | 1 refills | Status: DC

## 2023-09-09 NOTE — Progress Notes (Signed)
 Date:  09/09/2023   Name:  Anthony Cohen   DOB:  28-Dec-1960   MRN:  [id number]   Chief Complaint: Annual Exam Anthony Cohen is a 63 y.o. male who presents today for his Complete Annual Exam. He feels fairly well. He reports exercising none due to repeat foot surgery. He reports he is sleeping fairly well. He had to cancel his colonoscopy but will reschedule that as soon as he can walk.  Health Maintenance  Topic Date Due   HIV Screening  Never done   Pneumococcal Vaccination (1 of 2 - PCV) Never done   COVID-19 Vaccine (4 - 2024-25 season) 11/21/2022   Yearly kidney function blood test for diabetes  09/07/2023   Colon Cancer Screening  10/03/2023   Zoster (Shingles) Vaccine (1 of 2) 12/10/2023*   DTaP/Tdap/Td vaccine (1 - Tdap) 09/08/2024*   Flu Shot  10/21/2023   Yearly kidney health urinalysis for diabetes  02/24/2024   Medicare Annual Wellness Visit  06/14/2024   Hepatitis C Screening  Completed   HPV Vaccine  Aged Out   Meningitis B Vaccine  Aged Out  *Topic was postponed. The date shown is not the original due date.    Lab Results  Component Value Date   PSA1 0.5 11/05/2021   PSA1 0.2 04/20/2016   PSA 0.9 07/19/2013    Diabetes He presents for his follow-up diabetic visit. He has type 2 diabetes mellitus. His disease course has been stable. Pertinent negatives for hypoglycemia include no dizziness, headaches or nervousness/anxiousness. Pertinent negatives for diabetes include no chest pain and no fatigue. Current diabetic treatment includes diet.  Hyperlipidemia This is a chronic problem. Recent lipid tests were reviewed and are high. Pertinent negatives include no chest pain or shortness of breath. Current antihyperlipidemic treatment includes ezetimibe . The current treatment provides moderate improvement of lipids.  Depression        This is a chronic problem.  Associated symptoms include no fatigue, no appetite change and no headaches.  Past treatments include  other medications and SNRIs - Serotonin and norepinephrine reuptake inhibitors.  Compliance with treatment is good.   Review of Systems  Constitutional:  Negative for appetite change, fatigue and fever.  Respiratory:  Negative for chest tightness, shortness of breath and wheezing.   Cardiovascular:  Negative for chest pain, palpitations and leg swelling.  Gastrointestinal:  Negative for abdominal pain, blood in stool, constipation and diarrhea.  Genitourinary:  Negative for dysuria and hematuria.  Musculoskeletal:  Positive for gait problem (has cast on left foot s/p surgery).  Neurological:  Negative for dizziness, light-headedness and headaches.  Psychiatric/Behavioral:  Positive for depression. Negative for dysphoric mood and sleep disturbance. The patient is not nervous/anxious.      Lab Results  Component Value Date   NA 140 09/07/2022   K 4.6 09/07/2022   CO2 26 09/07/2022   GLUCOSE 107 (H) 09/07/2022   BUN 19 09/07/2022   CREATININE 0.93 09/07/2022   CALCIUM  9.7 09/07/2022   EGFR 93 09/07/2022   GFRNONAA >60 09/21/2019   Lab Results  Component Value Date   CHOL 225 (H) 09/07/2022   HDL 68 09/07/2022   LDLCALC 118 (H) 09/07/2022   TRIG 227 (H) 09/07/2022   CHOLHDL 3.3 09/07/2022   Lab Results  Component Value Date   TSH 1.810 11/05/2021   Lab Results  Component Value Date   HGBA1C 5.8 (H) 02/24/2023   Lab Results  Component Value Date   WBC 3.9 05/05/2022  HGB 14.3 05/05/2022   HCT 42.7 05/05/2022   MCV 96 05/05/2022   PLT 204 05/05/2022   Lab Results  Component Value Date   ALT 47 (H) 03/08/2022   AST 34 03/08/2022   ALKPHOS 64 03/08/2022   BILITOT 0.2 03/08/2022   No results found for: Lucetta Russel, VD25OH   Patient Active Problem List   Diagnosis Date Noted   Left corneal abrasion 08/24/2022   Primary osteoarthritis of right hip 05/20/2021   Right inguinal pain 05/04/2021   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   Erectile  dysfunction 06/09/2020   Pinched nerve in shoulder, left 09/11/2019   Pes planus, congenital 05/26/2018   Restless leg syndrome 05/26/2018   Arthritis of knee 10/25/2016   GERD without esophagitis 04/20/2016   Type II diabetes mellitus with complication (HCC) 06/16/2015   Mixed hyperlipidemia 06/16/2015   OSA (obstructive sleep apnea) 03/10/2015   Recurrent major depressive disorder, in partial remission (HCC) 02/19/2015   Family history of digestive disorder 02/19/2015   Tobacco use disorder, mild, in sustained remission 02/19/2015    No Known Allergies  Past Surgical History:  Procedure Laterality Date   FOOT ARTHRODESIS, TRIPLE Left 06/18/2022   HIP ARTHROPLASTY Right 11/2021    Social History   Tobacco Use   Smoking status: Former    Current packs/day: 0.00    Average packs/day: 1 pack/day for 3.0 years (3.0 ttl pk-yrs)    Types: Cigarettes    Start date: 03/28/2015    Quit date: 03/29/2015    Years since quitting: 8.4   Smokeless tobacco: Never  Vaping Use   Vaping status: Never Used  Substance Use Topics   Alcohol use: Yes    Alcohol/week: 0.0 standard drinks of alcohol   Drug use: No     Medication list has been reviewed and updated.  Current Meds  Medication Sig   ezetimibe  (ZETIA ) 10 MG tablet TAKE 1 TABLET BY MOUTH DAILY   famotidine (PEPCID) 20 MG tablet Take by mouth.   gabapentin  (NEURONTIN ) 400 MG capsule TAKE 1 CAPSULE BY MOUTH TWICE A DAY   Multiple Vitamin (MULTI-VITAMIN) tablet Take 1 tablet by mouth daily.   venlafaxine  XR (EFFEXOR -XR) 75 MG 24 hr capsule TAKE 3 CAPSULES BY MOUTH EVERY MORNING WITH BREAKFAST   [DISCONTINUED] buPROPion  (WELLBUTRIN  XL) 150 MG 24 hr tablet TAKE THREE TABLETS BY MOUTH DAILY       02/24/2023   10:19 AM 09/07/2022   10:53 AM 05/05/2022    9:49 AM 03/08/2022   11:02 AM  GAD 7 : Generalized Anxiety Score  Nervous, Anxious, on Edge 0 0 0 0  Control/stop worrying 0 0 0 0  Worry too much - different things 0 0 0 0   Trouble relaxing 0 0 0 0  Restless 0 0 0 0  Easily annoyed or irritable 0 0 0 0  Afraid - awful might happen 0 0 0 0  Total GAD 7 Score 0 0 0 0  Anxiety Difficulty Not difficult at all Not difficult at all Not difficult at all Not difficult at all       06/15/2023    2:03 PM 02/24/2023   10:19 AM 09/07/2022   10:53 AM  Depression screen PHQ 2/9  Decreased Interest 0 0 0  Down, Depressed, Hopeless 0 0 0  PHQ - 2 Score 0 0 0  Altered sleeping 0 0 0  Tired, decreased energy 0 1 2  Change in appetite 0 0 0  Feeling  bad or failure about yourself  0 0 0  Trouble concentrating 0 0 0  Moving slowly or fidgety/restless 0 0 0  Suicidal thoughts 0 0 0  PHQ-9 Score 0 1 2  Difficult doing work/chores Not difficult at all Not difficult at all Not difficult at all    BP Readings from Last 3 Encounters:  09/09/23 (!) 144/76  02/24/23 124/76  10/27/22 (!) 140/84    Physical Exam Vitals and nursing note reviewed.  Constitutional:      Appearance: Normal appearance. He is well-developed.  HENT:     Head: Normocephalic.     Right Ear: Tympanic membrane, ear canal and external ear normal.     Left Ear: Tympanic membrane, ear canal and external ear normal.     Nose: Nose normal.   Eyes:     Conjunctiva/sclera: Conjunctivae normal.     Pupils: Pupils are equal, round, and reactive to light.   Neck:     Thyroid: No thyromegaly.     Vascular: No carotid bruit.   Cardiovascular:     Rate and Rhythm: Normal rate and regular rhythm.     Heart sounds: Normal heart sounds.  Pulmonary:     Effort: Pulmonary effort is normal.     Breath sounds: Normal breath sounds. No wheezing.  Chest:  Breasts:    Right: No mass.     Left: No mass.  Abdominal:     General: Bowel sounds are normal.     Palpations: Abdomen is soft.     Tenderness: There is no abdominal tenderness.   Musculoskeletal:        General: Normal range of motion.     Cervical back: Normal range of motion and neck supple.      Right lower leg: No edema.  Lymphadenopathy:     Cervical: No cervical adenopathy.   Skin:    General: Skin is warm and dry.     Capillary Refill: Capillary refill takes less than 2 seconds.   Neurological:     General: No focal deficit present.     Mental Status: He is alert and oriented to person, place, and time.     Gait: Gait abnormal (due to cast on left foot).     Deep Tendon Reflexes: Reflexes are normal and symmetric.   Psychiatric:        Attention and Perception: Attention normal.        Mood and Affect: Mood normal.        Thought Content: Thought content normal.     Wt Readings from Last 3 Encounters:  09/09/23 226 lb (102.5 kg)  02/24/23 235 lb (106.6 kg)  09/07/22 246 lb (111.6 kg)    BP (!) 144/76   Pulse 100   Ht 6' (1.829 m)   Wt 226 lb (102.5 kg)   SpO2 97%   BMI 30.65 kg/m   Assessment and Plan:  Problem List Items Addressed This Visit       Unprioritized   Recurrent major depressive disorder, in partial remission (HCC) - Primary (Chronic)   Followed by Psych.  Doing well on Effexor  and Bupropion .      Relevant Medications   buPROPion  (WELLBUTRIN  XL) 150 MG 24 hr tablet   Other Relevant Orders   TSH   OSA (obstructive sleep apnea) (Chronic)   He is not using CPAP due to mask intolerance.      Relevant Orders   CBC with Differential/Platelet   Type II diabetes mellitus  with complication (HCC) (Chronic)   DM managed with diet only. Lab Results  Component Value Date   HGBA1C 5.8 (H) 02/24/2023  May be able to change dx to prediabetes.       Relevant Orders   Comprehensive metabolic panel with GFR   Hemoglobin A1c   TSH   Microalbumin / creatinine urine ratio   GERD without esophagitis (Chronic)   Relevant Orders   CBC with Differential/Platelet   Mixed hyperlipidemia   On Zetia ; not on statin due to elevated LFTs in the past.      Relevant Orders   Lipid panel    Return in about 6 months (around 03/10/2024) for  Depression, DM.    Sheron Dixons, MD [facility] [place]

## 2023-09-09 NOTE — Assessment & Plan Note (Signed)
 Followed by Psych.  Doing well on Effexor  and Bupropion .

## 2023-09-09 NOTE — Assessment & Plan Note (Signed)
 DM managed with diet only. Lab Results  Component Value Date   HGBA1C 5.8 (H) 02/24/2023  May be able to change dx to prediabetes.

## 2023-09-09 NOTE — Assessment & Plan Note (Signed)
 He is not using CPAP due to mask intolerance.

## 2023-09-09 NOTE — Assessment & Plan Note (Signed)
 On Zetia ; not on statin due to elevated LFTs in the past.

## 2023-09-10 LAB — COMPREHENSIVE METABOLIC PANEL WITH GFR
ALT: 61 IU/L — ABNORMAL HIGH (ref 0–44)
AST: 60 IU/L — ABNORMAL HIGH (ref 0–40)
Albumin: 4.6 g/dL (ref 3.9–4.9)
Alkaline Phosphatase: 72 IU/L (ref 44–121)
BUN/Creatinine Ratio: 20 (ref 10–24)
BUN: 16 mg/dL (ref 8–27)
Bilirubin Total: 0.4 mg/dL (ref 0.0–1.2)
CO2: 23 mmol/L (ref 20–29)
Calcium: 9.7 mg/dL (ref 8.6–10.2)
Chloride: 103 mmol/L (ref 96–106)
Creatinine, Ser: 0.8 mg/dL (ref 0.76–1.27)
Globulin, Total: 2.6 g/dL (ref 1.5–4.5)
Glucose: 108 mg/dL — ABNORMAL HIGH (ref 70–99)
Potassium: 4.6 mmol/L (ref 3.5–5.2)
Sodium: 143 mmol/L (ref 134–144)
Total Protein: 7.2 g/dL (ref 6.0–8.5)
eGFR: 99 mL/min/{1.73_m2} (ref >59)

## 2023-09-10 LAB — CBC WITH DIFFERENTIAL/PLATELET
Basophils Absolute: 0.1 10*3/uL (ref 0.0–0.2)
Basos: 1 %
EOS (ABSOLUTE): 0.2 10*3/uL (ref 0.0–0.4)
Eos: 3 %
Hematocrit: 44.4 % (ref 37.5–51.0)
Hemoglobin: 14.9 g/dL (ref 13.0–17.7)
Immature Grans (Abs): 0 10*3/uL (ref 0.0–0.1)
Immature Granulocytes: 0 %
Lymphocytes Absolute: 1.7 10*3/uL (ref 0.7–3.1)
Lymphs: 32 %
MCH: 33.6 pg — ABNORMAL HIGH (ref 26.6–33.0)
MCHC: 33.6 g/dL (ref 31.5–35.7)
MCV: 100 fL — ABNORMAL HIGH (ref 79–97)
Monocytes Absolute: 0.6 10*3/uL (ref 0.1–0.9)
Monocytes: 11 %
Neutrophils Absolute: 2.8 10*3/uL (ref 1.4–7.0)
Neutrophils: 53 %
Platelets: 212 10*3/uL (ref 150–450)
RBC: 4.44 x10E6/uL (ref 4.14–5.80)
RDW: 13.2 % (ref 11.6–15.4)
WBC: 5.3 10*3/uL (ref 3.4–10.8)

## 2023-09-10 LAB — MICROALBUMIN / CREATININE URINE RATIO
Creatinine, Urine: 123.5 mg/dL
Microalb/Creat Ratio: 29 mg/g{creat} (ref 0–29)
Microalbumin, Urine: 36.2 ug/mL

## 2023-09-10 LAB — HEMOGLOBIN A1C
Est. average glucose Bld gHb Est-mCnc: 111 mg/dL
Hgb A1c MFr Bld: 5.5 % (ref 4.8–5.6)

## 2023-09-10 LAB — LIPID PANEL
Chol/HDL Ratio: 2.9 ratio (ref 0.0–5.0)
Cholesterol, Total: 221 mg/dL — ABNORMAL HIGH (ref 100–199)
HDL: 77 mg/dL (ref >39)
LDL Chol Calc (NIH): 115 mg/dL — ABNORMAL HIGH (ref 0–99)
Triglycerides: 172 mg/dL — ABNORMAL HIGH (ref 0–149)
VLDL Cholesterol Cal: 29 mg/dL (ref 5–40)

## 2023-09-10 LAB — TSH: TSH: 2.19 u[IU]/mL (ref 0.450–4.500)

## 2023-09-12 ENCOUNTER — Ambulatory Visit: Payer: Self-pay | Admitting: Internal Medicine

## 2023-09-15 ENCOUNTER — Encounter: Payer: Self-pay | Admitting: Emergency Medicine

## 2023-09-15 ENCOUNTER — Ambulatory Visit: Admission: EM | Admit: 2023-09-15 | Discharge: 2023-09-15 | Disposition: A | Discharge status: Home / Self Care

## 2023-09-15 DIAGNOSIS — B029 Zoster without complications: Secondary | ICD-10-CM

## 2023-09-15 MED ORDER — VALACYCLOVIR HCL 1 G PO TABS: 1000.0000 mg | ORAL_TABLET | Freq: Three times a day (TID) | ORAL | 0 refills | Status: AC

## 2023-09-15 NOTE — ED Provider Notes (Signed)
 [facility]    CSN: [id number] Arrival date & time: 09/15/23  9074      History   Chief Complaint Chief Complaint  Patient presents with   Rash    HPI Anthony Cohen is a 63 y.o. male presenting for painful raised erythematous papular rash of left side of face and left scalp/head x 2 days. Denies fever, fatigue, swelling, ear pain, eye pain, hearing or vision issues. History of abscess of left groin a couple of weeks ago. Had pustular drainage at that time. Feels this is similar.  Patient has not had a shingles vaccine.  Reports getting chickenpox vaccine when he was younger.  Patient reports recently having a surgery on his leg.  HPI  Past Medical History:  Diagnosis Date   Hypercholesteremia     Patient Active Problem List   Diagnosis Date Noted   Left corneal abrasion 08/24/2022   Primary osteoarthritis of right hip 05/20/2021   Right inguinal pain 05/04/2021   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   Erectile dysfunction 06/09/2020   Pinched nerve in shoulder, left 09/11/2019   Pes planus, congenital 05/26/2018   Restless leg syndrome 05/26/2018   Arthritis of knee 10/25/2016   GERD without esophagitis 04/20/2016   Type II diabetes mellitus with complication (HCC) 06/16/2015   Mixed hyperlipidemia 06/16/2015   OSA (obstructive sleep apnea) 03/10/2015   Recurrent major depressive disorder, in partial remission (HCC) 02/19/2015   Family history of digestive disorder 02/19/2015   Tobacco use disorder, mild, in sustained remission 02/19/2015    Past Surgical History:  Procedure Laterality Date   FOOT ARTHRODESIS, TRIPLE Left 06/18/2022   HIP ARTHROPLASTY Right 11/2021       Home Medications    Prior to Admission medications   Medication Sig Start Date End Date Taking? Authorizing Provider  calcium  carbonate (OSCAL) 1500 (600 Ca) MG TABS tablet Take by mouth. 07/22/23 09/20/23 Yes [provider]  Cholecalciferol 125 MCG (5000 UT)  capsule Take 125 mcg by mouth. 07/22/23 09/20/23 Yes [provider]  valACYclovir (VALTREX) 1000 MG tablet Take 1 tablet (1,000 mg total) by mouth 3 (three) times daily. 09/15/23  Yes Arvis Jolan NOVAK, PA-C  buPROPion  (WELLBUTRIN  XL) 150 MG 24 hr tablet Take 3 tablets (450 mg total) by mouth daily. 09/09/23   Justus Leita DEL, MD  ezetimibe  (ZETIA ) 10 MG tablet TAKE 1 TABLET BY MOUTH DAILY 08/01/23   Berglund, Laura H, MD  famotidine (PEPCID) 20 MG tablet Take by mouth. 11/26/21   [provider]  gabapentin  (NEURONTIN ) 400 MG capsule TAKE 1 CAPSULE BY MOUTH TWICE A DAY 05/17/23   Berglund, Laura H, MD  Multiple Vitamin (MULTI-VITAMIN) tablet Take 1 tablet by mouth daily.    [provider]  venlafaxine  XR (EFFEXOR -XR) 75 MG 24 hr capsule TAKE 3 CAPSULES BY MOUTH EVERY MORNING WITH BREAKFAST 08/16/23   Justus Leita DEL, MD    Family History Family History  Problem Relation Age of Onset   Hypertension Mother    Breast cancer Mother     Social History Social History   Tobacco Use   Smoking status: Former    Current packs/day: 0.00    Average packs/day: 1 pack/day for 3.0 years (3.0 ttl pk-yrs)    Types: Cigarettes    Start date: 03/28/2015    Quit date: 03/29/2015    Years since quitting: 8.4   Smokeless tobacco: Never  Vaping Use   Vaping status: Never Used  Substance Use Topics  Alcohol use: Yes    Alcohol/week: 0.0 standard drinks of alcohol   Drug use: No     Allergies   Patient has no known allergies.   Review of Systems Review of Systems  Constitutional:  Negative for fatigue and fever.  HENT:  Negative for congestion.   Respiratory:  Negative for cough.   Skin:  Positive for rash.  Neurological:  Negative for dizziness, weakness, numbness and headaches.  Hematological:  Negative for adenopathy.     Physical Exam Triage Vital Signs ED Triage Vitals  Encounter Vitals Group     BP 09/15/23 0934 (!) 147/92     Girls Systolic BP Percentile --       Girls Diastolic BP Percentile --      Boys Systolic BP Percentile --      Boys Diastolic BP Percentile --      Pulse Rate 09/15/23 0934 94     Resp 09/15/23 0934 18     Temp 09/15/23 0934 99 F (37.2 C)     Temp Source 09/15/23 0934 Oral     SpO2 09/15/23 0934 96 %     Weight --      Height --      Head Circumference --      Peak Flow --      Pain Score 09/15/23 0933 0     Pain Loc --      Pain Education --      Exclude from Growth Chart --    No data found.  Updated Vital Signs BP (!) 147/92 (BP Location: Right Arm)   Pulse 94   Temp 99 F (37.2 C) (Oral)   Resp 18   SpO2 96%     Physical Exam Vitals and nursing note reviewed.  Constitutional:      General: He is not in acute distress.    Appearance: Normal appearance. He is well-developed. He is not ill-appearing.  HENT:     Head: Normocephalic and atraumatic.     Right Ear: Tympanic membrane, ear canal and external ear normal.     Left Ear: Tympanic membrane, ear canal and external ear normal.     Nose: Nose normal.     Mouth/Throat:     Mouth: Mucous membranes are moist.     Pharynx: Oropharynx is clear.   Eyes:     General: No scleral icterus.    Conjunctiva/sclera: Conjunctivae normal.    Cardiovascular:     Rate and Rhythm: Normal rate and regular rhythm.  Pulmonary:     Effort: Pulmonary effort is normal. No respiratory distress.     Breath sounds: Normal breath sounds.   Musculoskeletal:     Cervical back: Neck supple.   Skin:    General: Skin is warm and dry.     Capillary Refill: Capillary refill takes less than 2 seconds.     Findings: Rash (erythematous papular rash left side of face and left scalp. Does not cross midline) present.   Neurological:     General: No focal deficit present.     Mental Status: He is alert. Mental status is at baseline.     Motor: No weakness.     Gait: Gait normal.   Psychiatric:        Mood and Affect: Mood normal.        Behavior: Behavior normal.            UC Treatments / Results  Labs (all labs ordered are listed, but  only abnormal results are displayed) Labs Reviewed - No data to display  EKG   Radiology No results found.  Procedures Procedures (including critical care time)  Medications Ordered in UC Medications - No data to display  Initial Impression / Assessment and Plan / UC Course  I have reviewed the triage vital signs and the nursing notes.  Pertinent labs & imaging results that were available during my care of the patient were reviewed by me and considered in my medical decision making (see chart for details).   63 year old male presents for painful erythematous papular rash of the left side of his face and head for the past 2 days.  See images included in chart.  This is most consistent with herpes zoster.  No complications with eye, mouth or ear involvement.  Will treat at this time with Valtrex 1000 mg 3 times daily x 7 days.  I reviewed CDC guidelines for shingles.  Supportive care advised.  Low suspicion for bacterial infection but I did advise him to return if he notices any pustular drainage, fever, spreading or worsening rash/pain.  Reviewed return precautions.   Final Clinical Impressions(s) / UC Diagnoses   Final diagnoses:  Herpes zoster without complication     Discharge Instructions      -Rash is consistent with shingles -Start antiviral meds and take for 1 week -Contagious to anyone who has not had or been vaccinated for chicken pox. Avoid pregnant women, babies, young children under 5, immunocompromised and elderly -If rash affects eyes, ears, mouth return for re-evaluation -If pustular drainage, fever, spreading rash return for re-evaluation     ED Prescriptions     Medication Sig Dispense Auth. Provider   valACYclovir (VALTREX) 1000 MG tablet Take 1 tablet (1,000 mg total) by mouth 3 (three) times daily. 21 tablet Arvis Jolan NOVAK, PA-C      PDMP not reviewed this  encounter.   Arvis Jolan NOVAK, PA-C 09/15/23 1025

## 2023-09-15 NOTE — Discharge Instructions (Addendum)
-  Rash is consistent with shingles -Start antiviral meds and take for 1 week -Contagious to anyone who has not had or been vaccinated for chicken pox. Avoid pregnant women, babies, young children under 5, immunocompromised and elderly -If rash affects eyes, ears, mouth return for re-evaluation -If pustular drainage, fever, spreading rash return for re-evaluation

## 2023-09-15 NOTE — ED Triage Notes (Signed)
 Pt presents with a rash on the left side of his face and scalp x 2 days.

## 2023-09-26 ENCOUNTER — Other Ambulatory Visit: Payer: Self-pay | Admitting: Internal Medicine

## 2023-09-26 DIAGNOSIS — M5412 Radiculopathy, cervical region: Secondary | ICD-10-CM

## 2023-09-29 NOTE — Telephone Encounter (Signed)
 Requested Prescriptions  Pending Prescriptions Disp Refills   gabapentin  (NEURONTIN ) 400 MG capsule [Pharmacy Med Name: GABAPENTIN  400 MG CAPSULE] 180 capsule 0    Sig: TAKE 1 CAPSULE BY MOUTH 2 TIMES A DAY     Neurology: Anticonvulsants - gabapentin  Passed - 09/29/2023  8:59 AM      Passed - Cr in normal range and within 360 days    Creatinine, Ser  Date Value Ref Range Status  09/09/2023 0.80 0.76 - 1.27 mg/dL Final         Passed - Completed PHQ-2 or PHQ-9 in the last 360 days      Passed - Valid encounter within last 12 months    Recent Outpatient Visits           2 weeks ago Recurrent major depressive disorder, in partial remission [facility])   [place] [facility] at [facility], Leita DEL, MD       Future Appointments             In 5 months Justus, Leita DEL, MD [facility] at [facility], [facility]

## 2023-12-23 ENCOUNTER — Ambulatory Visit
Admission: EM | Admit: 2023-12-23 | Discharge: 2023-12-23 | Disposition: A | Discharge status: Home / Self Care | Attending: Family Medicine | Admitting: Family Medicine

## 2023-12-23 DIAGNOSIS — J069 Acute upper respiratory infection, unspecified: Secondary | ICD-10-CM | POA: Diagnosis present

## 2023-12-23 DIAGNOSIS — J111 Influenza due to unidentified influenza virus with other respiratory manifestations: Secondary | ICD-10-CM | POA: Diagnosis present

## 2023-12-23 LAB — RESP PANEL BY RT-PCR (RSV, FLU A&B, COVID)  RVPGX2
Influenza A by PCR: NEGATIVE
Influenza B by PCR: NEGATIVE
Resp Syncytial Virus by PCR: NEGATIVE
SARS Coronavirus 2 by RT PCR: NEGATIVE

## 2023-12-23 NOTE — ED Triage Notes (Signed)
 Patient to Urgent Care with complaints of nasal congestion (yellow mucus)/ body aches/ lower back pain/ diarrhea/ nausea. Unsure of any fevers.   Symptoms x2 days.   Taking alka-seltzer plus (little relief).

## 2023-12-23 NOTE — Discharge Instructions (Addendum)
 Your  RSV, influenza and COVID are negative. You have a viral respiratory infection that will gradually improve over the next 7-10 days. Cough may last up to 3 weeks.    You can take Tylenol  and/or Ibuprofen as needed for fever reduction and pain relief.    For cough: honey 1/2 to 1 teaspoon (you can dilute the honey in water or another fluid).  You can also use guaifenesin and dextromethorphan for cough. You can use a humidifier for chest congestion and cough.  If you don't have a humidifier, you can sit in the bathroom with the hot shower running.      For sore throat: try warm salt water gargles, Mucinex sore throat cough drops or cepacol lozenges, throat spray, warm tea or water with lemon/honey, popsicles or ice, or OTC cold relief medicine for throat discomfort. You can also purchase chloraseptic spray at the pharmacy or dollar store.    For congestion: take a daily anti-histamine like Zyrtec, Claritin, and a oral decongestant, such as pseudoephedrine.  You can also use Flonase 1-2 sprays in each nostril daily. Afrin is also a good option, if you do not have high blood pressure.    It is important to stay hydrated: drink plenty of fluids (water, gatorade/powerade/pedialyte, juices, or teas) to keep your throat moisturized and help further relieve irritation/discomfort.    Return or go to the Emergency Department if symptoms worsen or do not improve in the next few days

## 2023-12-23 NOTE — ED Provider Notes (Signed)
 [facility]    CSN: [id number] Arrival date & time: 12/23/23  1843      History   Chief Complaint Chief Complaint  Patient presents with   Generalized Body Aches    HPI Anthony Cohen is a 63 y.o. male.   HPI  History obtained from the patient. Anthony Cohen presents for cough, lower back pain, nasal congestion, nausea, diarrhea, sore throat and malaise that started 2 days ago after visiting him at a rehab facility in [place].  Took some nausea medication and Alka-Seltzer plus for his symptoms.        Past Medical History:  Diagnosis Date   Hypercholesteremia     Patient Active Problem List   Diagnosis Date Noted   Left corneal abrasion 08/24/2022   Primary osteoarthritis of right hip 05/20/2021   Right inguinal pain 05/04/2021   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   Erectile dysfunction 06/09/2020   Pinched nerve in shoulder, left 09/11/2019   Pes planus, congenital 05/26/2018   Restless leg syndrome 05/26/2018   Arthritis of knee 10/25/2016   GERD without esophagitis 04/20/2016   Type II diabetes mellitus with complication (HCC) 06/16/2015   Mixed hyperlipidemia 06/16/2015   OSA (obstructive sleep apnea) 03/10/2015   Recurrent major depressive disorder, in partial remission 02/19/2015   Family history of digestive disorder 02/19/2015   Tobacco use disorder, mild, in sustained remission 02/19/2015    Past Surgical History:  Procedure Laterality Date   FOOT ARTHRODESIS, TRIPLE Left 06/18/2022   HIP ARTHROPLASTY Right 11/2021       Home Medications    Prior to Admission medications   Medication Sig Start Date End Date Taking? Authorizing Provider  buPROPion  (WELLBUTRIN  XL) 150 MG 24 hr tablet Take 3 tablets (450 mg total) by mouth daily. 09/09/23  Yes Justus Leita DEL, MD  ezetimibe  (ZETIA ) 10 MG tablet TAKE 1 TABLET BY MOUTH DAILY 08/01/23  Yes Berglund, Laura H, MD  gabapentin  (NEURONTIN ) 400 MG capsule TAKE 1 CAPSULE BY MOUTH 2  TIMES A DAY 09/29/23  Yes Justus Leita DEL, MD  venlafaxine  XR (EFFEXOR -XR) 75 MG 24 hr capsule TAKE 3 CAPSULES BY MOUTH EVERY MORNING WITH BREAKFAST 08/16/23  Yes Justus Leita DEL, MD  calcium  carbonate (OSCAL) 1500 (600 Ca) MG TABS tablet Take by mouth. 07/22/23 09/20/23  [provider]  famotidine (PEPCID) 20 MG tablet Take by mouth. 11/26/21   [provider]  Multiple Vitamin (MULTI-VITAMIN) tablet Take 1 tablet by mouth daily.    [provider]  valACYclovir  (VALTREX ) 1000 MG tablet Take 1 tablet (1,000 mg total) by mouth 3 (three) times daily. 09/15/23   Arvis Jolan NOVAK, PA-C    Family History Family History  Problem Relation Age of Onset   Hypertension Mother    Breast cancer Mother     Social History Social History   Tobacco Use   Smoking status: Former    Current packs/day: 0.00    Average packs/day: 1 pack/day for 3.0 years (3.0 ttl pk-yrs)    Types: Cigarettes    Start date: 03/28/2015    Quit date: 03/29/2015    Years since quitting: 8.7   Smokeless tobacco: Never  Vaping Use   Vaping status: Never Used  Substance Use Topics   Alcohol use: Yes    Alcohol/week: 0.0 standard drinks of alcohol   Drug use: No     Allergies   Patient has no known allergies.   Review of Systems Review of Systems: negative  unless otherwise stated in HPI.      Physical Exam Triage Vital Signs ED Triage Vitals  Encounter Vitals Group     BP 12/23/23 1907 (!) 146/95     Girls Systolic BP Percentile --      Girls Diastolic BP Percentile --      Boys Systolic BP Percentile --      Boys Diastolic BP Percentile --      Pulse Rate 12/23/23 1907 89     Resp 12/23/23 1907 19     Temp 12/23/23 1907 99.3 F (37.4 C)     Temp src --      SpO2 12/23/23 1907 98 %     Weight --      Height --      Head Circumference --      Peak Flow --      Pain Score 12/23/23 1906 4     Pain Loc --      Pain Education --      Exclude from Growth Chart --    No data  found.  Updated Vital Signs BP (!) 146/95   Pulse 89   Temp 99.3 F (37.4 C)   Resp 19   SpO2 98%   Visual Acuity Right Eye Distance:   Left Eye Distance:   Bilateral Distance:    Right Eye Near:   Left Eye Near:    Bilateral Near:     Physical Exam GEN:     alert, non-toxic appearing male in no distress    HENT:  mucus membranes moist, oropharyngeal without lesions or erythema, no tonsillar hypertrophy or exudates, clear nasal discharge EYES:   pupils equal and reactive, no scleral injection or discharge NECK:  good ROM,no meningismus   RESP:  no increased work of breathing, clear to auscultation bilaterally CVS:   regular rate and rhythm ABD: Soft, nontender, nondistended Skin:   warm and dry, no rash on visible skin    UC Treatments / Results  Labs (all labs ordered are listed, but only abnormal results are displayed) Labs Reviewed  RESP PANEL BY RT-PCR (RSV, FLU A&B, COVID)  [id number]    EKG   Radiology No results found.   Procedures Procedures (including critical care time)  Medications Ordered in UC Medications - No data to display  Initial Impression / Assessment and Plan / UC Course  I have reviewed the triage vital signs and the nursing notes.  Pertinent labs & imaging results that were available during my care of the patient were reviewed by me and considered in my medical decision making (see chart for details).       Pt is a 63 y.o. male who presents for 2 days of GI and respiratory symptoms. Anthony Cohen is afebrile here. Satting well on room air. Overall pt is non-toxic appearing, well hydrated, without respiratory distress.  Cardiopulmonary and abdominal exams are unremarkable.  COVID and influenza panel obtained and was negative. History consistent with viral respiratory illness. Discussed symptomatic treatment.  Explained lack of efficacy of antibiotics in viral disease.  Typical duration of symptoms discussed.   Return and ED precautions given  and voiced understanding. Discussed MDM, treatment plan and plan for follow-up with patient who agrees with plan.     Final Clinical Impressions(s) / UC Diagnoses   Final diagnoses:  Influenza-like illness  Viral URI with cough     Discharge Instructions      Your  RSV, influenza and COVID are negative. You have  a viral respiratory infection that will gradually improve over the next 7-10 days. Cough may last up to 3 weeks.    You can take Tylenol  and/or Ibuprofen as needed for fever reduction and pain relief.    For cough: honey 1/2 to 1 teaspoon (you can dilute the honey in water or another fluid).  You can also use guaifenesin and dextromethorphan for cough. You can use a humidifier for chest congestion and cough.  If you don't have a humidifier, you can sit in the bathroom with the hot shower running.      For sore throat: try warm salt water gargles, Mucinex sore throat cough drops or cepacol lozenges, throat spray, warm tea or water with lemon/honey, popsicles or ice, or OTC cold relief medicine for throat discomfort. You can also purchase chloraseptic spray at the pharmacy or dollar store.    For congestion: take a daily anti-histamine like Zyrtec, Claritin, and a oral decongestant, such as pseudoephedrine.  You can also use Flonase 1-2 sprays in each nostril daily. Afrin is also a good option, if you do not have high blood pressure.    It is important to stay hydrated: drink plenty of fluids (water, gatorade/powerade/pedialyte, juices, or teas) to keep your throat moisturized and help further relieve irritation/discomfort.    Return or go to the Emergency Department if symptoms worsen or do not improve in the next few days       ED Prescriptions   None    PDMP not reviewed this encounter.   Kriste Berth, DO 12/25/23 1015

## 2023-12-25 ENCOUNTER — Other Ambulatory Visit: Payer: Self-pay | Admitting: Internal Medicine

## 2023-12-25 DIAGNOSIS — E1169 Type 2 diabetes mellitus with other specified complication: Secondary | ICD-10-CM

## 2023-12-25 DIAGNOSIS — F3341 Major depressive disorder, recurrent, in partial remission: Secondary | ICD-10-CM

## 2023-12-27 NOTE — Telephone Encounter (Signed)
 Requested Prescriptions  Pending Prescriptions Disp Refills   ezetimibe  (ZETIA ) 10 MG tablet [Pharmacy Med Name: EZETIMIBE  10 MG TABLET] 90 tablet 0    Sig: TAKE 1 TABLET BY MOUTH DAILY     Cardiovascular:  Antilipid - Sterol Transport Inhibitors Failed - 12/27/2023  9:45 AM      Failed - AST in normal range and within 360 days    AST  Date Value Ref Range Status  09/09/2023 60 (H) 0 - 40 IU/L Final         Failed - ALT in normal range and within 360 days    ALT  Date Value Ref Range Status  09/09/2023 61 (H) 0 - 44 IU/L Final         Failed - Lipid Panel in normal range within the last 12 months    Cholesterol, Total  Date Value Ref Range Status  09/09/2023 221 (H) 100 - 199 mg/dL Final   LDL Chol Calc (NIH)  Date Value Ref Range Status  09/09/2023 115 (H) 0 - 99 mg/dL Final   HDL  Date Value Ref Range Status  09/09/2023 77 >39 mg/dL Final   Triglycerides  Date Value Ref Range Status  09/09/2023 172 (H) 0 - 149 mg/dL Final         Passed - Patient is not pregnant      Passed - Valid encounter within last 12 months    Recent Outpatient Visits           3 months ago Recurrent major depressive disorder, in partial remission   [facility], Leita DEL, MD       Future Appointments             In 2 months Justus Leita DEL, MD [facility] at [facility], [phone] [place]             venlafaxine  XR (EFFEXOR -XR) 75 MG 24 hr capsule [Pharmacy Med Name: VENLAFAXINE  HCL ER 75 MG CAP] 270 capsule 0    Sig: TAKE 3 CAPSULES BY MOUTH EVERY MORNING WITH BREAKFAST     Psychiatry: Antidepressants - SNRI - desvenlafaxine & venlafaxine  Failed - 12/27/2023  9:45 AM      Failed - Last BP in normal range    BP Readings from Last 1 Encounters:  12/23/23 (!) 146/95         Failed - Lipid Panel in normal range within the last 12 months    Cholesterol, Total  Date Value Ref Range Status   09/09/2023 221 (H) 100 - 199 mg/dL Final   LDL Chol Calc (NIH)  Date Value Ref Range Status  09/09/2023 115 (H) 0 - 99 mg/dL Final   HDL  Date Value Ref Range Status  09/09/2023 77 >39 mg/dL Final   Triglycerides  Date Value Ref Range Status  09/09/2023 172 (H) 0 - 149 mg/dL Final         Passed - Cr in normal range and within 360 days    Creatinine, Ser  Date Value Ref Range Status  09/09/2023 0.80 0.76 - 1.27 mg/dL Final         Passed - Completed PHQ-2 or PHQ-9 in the last 360 days      Passed - Valid encounter within last 6 months    Recent Outpatient Visits           3 months ago Recurrent major depressive disorder, in  partial remission   [place] [facility] at [facility], Leita DEL, MD       Future Appointments             In 2 months Justus, Leita DEL, MD [facility] at [facility], [phone] [place]

## 2024-03-12 ENCOUNTER — Ambulatory Visit: Admitting: Internal Medicine

## 2024-03-12 ENCOUNTER — Ambulatory Visit
Admission: RE | Admit: 2024-03-12 | Discharge: 2024-03-12 | Disposition: A | Source: Ambulatory Visit | Attending: Internal Medicine | Admitting: Internal Medicine

## 2024-03-12 ENCOUNTER — Ambulatory Visit
Admission: RE | Admit: 2024-03-12 | Discharge: 2024-03-12 | Disposition: A | Discharge status: Home / Self Care | Attending: Internal Medicine | Admitting: Internal Medicine

## 2024-03-12 ENCOUNTER — Encounter: Payer: Self-pay | Admitting: Internal Medicine

## 2024-03-12 VITALS — BP 122/78 | HR 88 | Ht 71.0 in | Wt 237.0 lb

## 2024-03-12 DIAGNOSIS — R7303 Prediabetes: Secondary | ICD-10-CM

## 2024-03-12 DIAGNOSIS — G8929 Other chronic pain: Secondary | ICD-10-CM

## 2024-03-12 DIAGNOSIS — M25511 Pain in right shoulder: Secondary | ICD-10-CM

## 2024-03-12 DIAGNOSIS — E782 Mixed hyperlipidemia: Secondary | ICD-10-CM

## 2024-03-12 LAB — POCT GLYCOSYLATED HEMOGLOBIN (HGB A1C): Hemoglobin A1C: 5.6 % (ref 4.0–5.6)

## 2024-03-12 MED ORDER — MELOXICAM 15 MG PO TABS: 15.0000 mg | ORAL_TABLET | Freq: Every day | ORAL | 0 refills | Status: DC

## 2024-03-12 NOTE — Assessment & Plan Note (Signed)
 Doing well on Zetia . Lab Results  Component Value Date   LDLCALC 115 (H) 09/09/2023

## 2024-03-12 NOTE — Assessment & Plan Note (Signed)
 Currently medications are none.  No hypoglycemic episodes noted. Last visit medical regimen changes were none. Lab Results  Component Value Date   HGBA1C 5.6 03/12/2024  A1C remains in the normal range with diet only.

## 2024-03-12 NOTE — Progress Notes (Signed)
 "   Date:  03/12/2024   Name:  Anthony Cohen   DOB:  1960-10-22   MRN:  [id number]   Chief Complaint: Follow-up (DM-f/u)  Diabetes He presents for his follow-up diabetic visit. Diabetes type: prediabetes. His disease course has been stable. Pertinent negatives for hypoglycemia include no dizziness, headaches or nervousness/anxiousness. Pertinent negatives for diabetes include no chest pain, no fatigue and no weakness. Symptoms are stable.  Shoulder Pain  The pain is present in the right shoulder. This is a new problem. Episode onset: 2-3 months. The problem occurs every several days. The problem has been unchanged. The pain is moderate. Associated symptoms include an inability to bear weight and a limited range of motion. He has tried NSAIDS for the symptoms.    Review of Systems  Constitutional:  Negative for chills, fatigue and unexpected weight change.  HENT:  Negative for nosebleeds.   Eyes:  Negative for visual disturbance.  Respiratory:  Negative for cough, chest tightness, shortness of breath and wheezing.   Cardiovascular:  Negative for chest pain, palpitations and leg swelling.  Gastrointestinal:  Negative for abdominal pain, constipation and diarrhea.  Musculoskeletal:  Positive for arthralgias (right shoulder pain).  Neurological:  Negative for dizziness, weakness, light-headedness and headaches.  Psychiatric/Behavioral:  Negative for sleep disturbance. The patient is not nervous/anxious.      Lab Results  Component Value Date   NA 143 09/09/2023   K 4.6 09/09/2023   CO2 23 09/09/2023   GLUCOSE 108 (H) 09/09/2023   BUN 16 09/09/2023   CREATININE 0.80 09/09/2023   CALCIUM  9.7 09/09/2023   EGFR 99 09/09/2023   GFRNONAA >60 09/21/2019   Lab Results  Component Value Date   CHOL 221 (H) 09/09/2023   HDL 77 09/09/2023   LDLCALC 115 (H) 09/09/2023   TRIG 172 (H) 09/09/2023   CHOLHDL 2.9 09/09/2023   Lab Results  Component Value Date   TSH 2.190 09/09/2023   Lab  Results  Component Value Date   HGBA1C 5.6 03/12/2024   Lab Results  Component Value Date   WBC 5.3 09/09/2023   HGB 14.9 09/09/2023   HCT 44.4 09/09/2023   MCV 100 (H) 09/09/2023   PLT 212 09/09/2023   Lab Results  Component Value Date   ALT 61 (H) 09/09/2023   AST 60 (H) 09/09/2023   ALKPHOS 72 09/09/2023   BILITOT 0.4 09/09/2023   No results found for: MARIEN BOLLS, VD25OH   Patient Active Problem List   Diagnosis Date Noted   Left corneal abrasion 08/24/2022   Primary osteoarthritis of right hip 05/20/2021   Right inguinal pain 05/04/2021   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   Erectile dysfunction 06/09/2020   Pinched nerve in shoulder, left 09/11/2019   Pes planus, congenital 05/26/2018   Restless leg syndrome 05/26/2018   Arthritis of knee 10/25/2016   GERD without esophagitis 04/20/2016   Prediabetes 06/16/2015   Mixed hyperlipidemia 06/16/2015   OSA (obstructive sleep apnea) 03/10/2015   Recurrent major depressive disorder, in partial remission 02/19/2015   Family history of digestive disorder 02/19/2015   Tobacco use disorder, mild, in sustained remission 02/19/2015    Allergies[1]  Past Surgical History:  Procedure Laterality Date   FOOT ARTHRODESIS, TRIPLE Left 06/18/2022   HIP ARTHROPLASTY Right 11/2021    Social History[2]   Medication list has been reviewed and updated.  Active Medications[3]     03/12/2024   10:07 AM 02/24/2023   10:19 AM 09/07/2022   10:53  AM 05/05/2022    9:49 AM  GAD 7 : Generalized Anxiety Score  Nervous, Anxious, on Edge 0 0 0 0  Control/stop worrying 0 0 0 0  Worry too much - different things 0 0 0 0  Trouble relaxing 0 0 0 0  Restless 0 0 0 0  Easily annoyed or irritable 0 0 0 0  Afraid - awful might happen 0 0 0 0  Total GAD 7 Score 0 0 0 0  Anxiety Difficulty Not difficult at all Not difficult at all Not difficult at all Not difficult at all       03/12/2024   10:06 AM 06/15/2023     2:03 PM 02/24/2023   10:19 AM  Depression screen PHQ 2/9  Decreased Interest 0 0 0  Down, Depressed, Hopeless 0 0 0  PHQ - 2 Score 0 0 0  Altered sleeping 0 0 0  Tired, decreased energy 1 0 1  Change in appetite 0 0 0  Feeling bad or failure about yourself  0 0 0  Trouble concentrating 0 0 0  Moving slowly or fidgety/restless 0 0 0  Suicidal thoughts 0 0 0  PHQ-9 Score 1 0  1   Difficult doing work/chores Not difficult at all Not difficult at all Not difficult at all     Data saved with a previous flowsheet row definition    BP Readings from Last 3 Encounters:  03/12/24 122/78  12/23/23 (!) 146/95  09/15/23 (!) 147/92    Physical Exam Vitals and nursing note reviewed.  Constitutional:      General: He is not in acute distress.    Appearance: Normal appearance. He is well-developed.  HENT:     Head: Normocephalic and atraumatic.  Neck:     Vascular: No carotid bruit.  Cardiovascular:     Rate and Rhythm: Normal rate and regular rhythm.     Heart sounds: No murmur heard. Pulmonary:     Effort: Pulmonary effort is normal. No respiratory distress.     Breath sounds: No wheezing or rhonchi.  Musculoskeletal:     Right shoulder: Bony tenderness present. Decreased range of motion.     Left shoulder: No bony tenderness. Normal range of motion.     Cervical back: Normal range of motion.     Right lower leg: No edema.     Left lower leg: No edema.  Lymphadenopathy:     Cervical: No cervical adenopathy.  Skin:    General: Skin is warm and dry.     Findings: No rash.  Neurological:     Mental Status: He is alert and oriented to person, place, and time.  Psychiatric:        Mood and Affect: Mood normal.        Behavior: Behavior normal.     Wt Readings from Last 3 Encounters:  03/12/24 237 lb (107.5 kg)  09/09/23 226 lb (102.5 kg)  02/24/23 235 lb (106.6 kg)    BP 122/78   Pulse 88   Ht 5' 11 (1.803 m)   Wt 237 lb (107.5 kg)   SpO2 97%   BMI 33.05 kg/m    Assessment and Plan:  Problem List Items Addressed This Visit       Unprioritized   Prediabetes - Primary   Currently medications are none.  No hypoglycemic episodes noted. Last visit medical regimen changes were none. Lab Results  Component Value Date   HGBA1C 5.6 03/12/2024  A1C remains in  the normal range with diet only.      Relevant Orders   POCT HgB A1C (Completed)   Mixed hyperlipidemia   Doing well on Zetia . Lab Results  Component Value Date   LDLCALC 115 (H) 09/09/2023         Other Visit Diagnoses       Chronic right shoulder pain       suspect mild OA with intermittent symptoms will get imaging and start Mobic  daily refer to Dr. Alvia   Relevant Medications   meloxicam  (MOBIC ) 15 MG tablet   Other Relevant Orders   DG Shoulder Right       No follow-ups on file.    Leita HILARIO Adie, MD [facility] [place]           [1] No Known Allergies [2]  Social History Tobacco Use   Smoking status: Former    Current packs/day: 0.00    Average packs/day: 1 pack/day for 3.0 years (3.0 ttl pk-yrs)    Types: Cigarettes    Start date: 03/28/2015    Quit date: 03/29/2015    Years since quitting: 8.9   Smokeless tobacco: Never  Vaping Use   Vaping status: Never Used  Substance Use Topics   Alcohol use: Yes    Comment: rare   Drug use: No  [3]  Current Meds  Medication Sig   buPROPion  (WELLBUTRIN  XL) 150 MG 24 hr tablet Take 3 tablets (450 mg total) by mouth daily.   ezetimibe  (ZETIA ) 10 MG tablet TAKE 1 TABLET BY MOUTH DAILY   famotidine (PEPCID) 20 MG tablet Take by mouth.   gabapentin  (NEURONTIN ) 400 MG capsule TAKE 1 CAPSULE BY MOUTH 2 TIMES A DAY   meloxicam  (MOBIC ) 15 MG tablet Take 1 tablet (15 mg total) by mouth daily.   Multiple Vitamin (MULTI-VITAMIN) tablet Take 1 tablet by mouth daily.   valACYclovir  (VALTREX ) 1000 MG tablet Take 1 tablet (1,000 mg total) by mouth 3 (three) times daily.    venlafaxine  XR (EFFEXOR -XR) 75 MG 24 hr capsule TAKE 3 CAPSULES BY MOUTH EVERY MORNING WITH BREAKFAST   "

## 2024-03-14 ENCOUNTER — Ambulatory Visit (INDEPENDENT_AMBULATORY_CARE_PROVIDER_SITE_OTHER): Admitting: Family Medicine

## 2024-03-14 ENCOUNTER — Encounter: Payer: Self-pay | Admitting: Family Medicine

## 2024-03-14 VITALS — BP 130/88 | HR 82 | Ht 71.0 in | Wt 238.0 lb

## 2024-03-14 DIAGNOSIS — M19011 Primary osteoarthritis, right shoulder: Secondary | ICD-10-CM | POA: Diagnosis not present

## 2024-03-14 DIAGNOSIS — M7541 Impingement syndrome of right shoulder: Secondary | ICD-10-CM | POA: Insufficient documentation

## 2024-03-14 MED ORDER — BACLOFEN 5 MG PO TABS: 1.0000 | ORAL_TABLET | Freq: Three times a day (TID) | ORAL | 0 refills | Status: AC | PRN

## 2024-03-14 NOTE — Assessment & Plan Note (Signed)
 History of Present Illness Anthony Cohen is a 63 year old male with right shoulder osteoarthritis who presents with a two-month history of right shoulder pain.  Right shoulder pain - Intermittent pain for two months - Sharp and stabbing quality - Deep location, primarily in the axillary region - Provoked by movement or lifting - Overhead reaching without lifting heavy objects is tolerated - Localized to the shoulder without radiation - No numbness or paresthesia in the right upper extremity - Gradual onset without identifiable trauma or change in activity level - No prior episodes or significant injury to the area - Sleep is not affected by the pain  Symptom management and medication use - Acetaminophen  used without significant benefit - Meloxicam  initiated one day prior to visit; no gastrointestinal side effects - Ibuprofen avoided due to reflux - No use of other anti-inflammatory agents - No use of ice, heat, or topical analgesics  Physical Exam RIGHT SHOULDER INSPECTION: No deformity, swelling, or erythema. Normal alignment and posture. PALPATION: Tender spasm at the right upper trapezius. Non-tender at the rhomboids, subacromial space, and bicipital groove. RANGE OF MOTION: Full and symmetric forward flexion and extension without mechanical limitation. STRENGTH: 5/5 strength with internal and external rotation, painless. NEUROLOGICAL: Sensation intact to light touch in the right upper extremity; no focal motor deficit. SPECIAL TESTS: Positive Hawkins test. Positive glenohumeral load test. Positive Speeds test. OBriens test equivocal. Isolated supraspinatus testing elicits discomfort on the right.  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 Imaging shows joint space narrowing, osteophyte  formation, and mild impingement. Osteoarthritis is the primary pain source, tendinitis from altered biomechanics. Conservative management preferred. - Continued meloxicam  for 1-2 weeks as tolerated for anti-inflammatory effect. - Provided anticipatory guidance on gastrointestinal side effects of meloxicam , instructed to contact if these develop. - Sent home exercise program via MyChart; instructed to delay resistance and weight-based exercises until after the New Year and to begin with range of motion activities as tolerated. - Scheduled follow-up for four weeks (January 8th, Thursday at 1:20 PM) to reassess and determine need for further intervention. - Conditional plan for intra-articular corticosteroid injection if insufficient improvement with oral anti-inflammatory therapy or if gastrointestinal side effects occur. - Advised as-needed meloxicam  dosing after initial consistent course if symptoms improve. - Encouraged bilateral shoulder exercises to maintain symmetry. - Provided education on osteoarthritis and tendinitis pathophysiology and rationale for conservative management.  Right upper trapezius muscle spasm Localized right upper trapezius tenderness and spasm secondary to compensatory overuse from altered shoulder mechanics due to osteoarthritis. Contributes to discomfort but not primary pain source. - Prescribed baclofen  muscle relaxant for as-needed use for muscle tightness and spasm, with counseling regarding potential drowsiness. - Advised use primarily at bedtime or with severe symptoms, and to avoid activities requiring alertness if drowsiness occurs.

## 2024-03-14 NOTE — Patient Instructions (Signed)
 VISIT SUMMARY:  You visited us  today due to right shoulder pain that has been ongoing for two months. We discussed your symptoms, reviewed your current medications, and provided a plan to manage your shoulder osteoarthritis and associated muscle spasm.  YOUR PLAN:  RIGHT SHOULDER OSTEOARTHRITIS WITH BICEPS AND ROTATOR CUFF TENDINITIS: You have osteoarthritis in your right shoulder, which is causing pain and has led to tendinitis in your biceps and rotator cuff due to compensatory changes. -Continue taking meloxicam  for 1-2 weeks as tolerated for its anti-inflammatory effect. Take with food. -Be aware of potential gastrointestinal side effects from meloxicam  and contact us  if these develop. -Follow the home exercise program sent via MyChart, focusing on range of motion activities and delaying resistance and weight-based exercises until after the New Year. -Return for a follow-up appointment in four weeks on January 8th, Thursday at 1:20 PM to reassess your condition. -If there is insufficient improvement with oral anti-inflammatory therapy or if gastrointestinal side effects occur, we may consider an intra-articular corticosteroid injection. -If your symptoms improve, you can take meloxicam  as needed after the initial consistent course. -Perform bilateral shoulder exercises to maintain symmetry. -We provided education on the nature of osteoarthritis and tendinitis and why we are using conservative management.  RIGHT UPPER TRAPEZIUS MUSCLE SPASM: You have muscle spasm and tenderness in your right upper trapezius due to overuse from altered shoulder mechanics. -Take the prescribed muscle relaxant as needed for muscle tightness and spasm. -Be aware that the muscle relaxant may cause drowsiness, so use it primarily at bedtime or with severe symptoms, and avoid activities requiring alertness if drowsiness occurs.

## 2024-03-14 NOTE — Progress Notes (Signed)
 "    Primary Care / Sports Medicine Office Visit  Patient Information:  Patient ID: Anthony Cohen, male DOB: 26-Oct-1960 Age: 63 y.o. MRN: [id number]   Anthony Cohen is a pleasant 63 y.o. male presenting with the following:  Chief Complaint  Patient presents with   Shoulder Pain    Right shoulder pain x 3 months. Pain is located inside the joint. Pain comes and goes and it is a sharp pain. Aggravating factors are lifting and carrying things. He was taking IBU but PCP gave him Meloxicam  he took the first one yesterday. Xray's obtained.    Vitals:   03/14/24 0839  BP: 130/88  Pulse: 82  SpO2: 93%   Vitals:   03/14/24 0839  Weight: 238 lb (108 kg)  Height: 5' 11 (1.803 m)   Body mass index is 33.19 kg/m.  No results found.   Discussed the use of AI scribe software for clinical note transcription with the patient, who gave verbal consent to proceed.   Independent interpretation of notes and tests performed by another provider:   Right shoulder x-ray (03/14/2024): Glenohumeral joint space narrowing, marginal osteophyte formation at the glenoid rim and humeral head, subacromial undersurface irregularity, mild acromial impingement, no acute osseous abnormality, no dislocation, no soft tissue abnormality visualized. (Independently interpreted)  Procedures performed:   None  Pertinent History, Exam, Impression, and Recommendations:   Problem List Items Addressed This Visit     Impingement syndrome of shoulder, right   See additional assessment(s) for plan details.      Relevant Medications   Baclofen  5 MG TABS   Osteoarthritis of glenohumeral joint, right - Primary   History of Present Illness Anthony Cohen is a 63 year old male with right shoulder osteoarthritis who presents with a two-month history of right shoulder pain.  Right shoulder pain - Intermittent pain for two months - Sharp and stabbing quality - Deep location, primarily in the axillary region -  Provoked by movement or lifting - Overhead reaching without lifting heavy objects is tolerated - Localized to the shoulder without radiation - No numbness or paresthesia in the right upper extremity - Gradual onset without identifiable trauma or change in activity level - No prior episodes or significant injury to the area - Sleep is not affected by the pain  Symptom management and medication use - Acetaminophen  used without significant benefit - Meloxicam  initiated one day prior to visit; no gastrointestinal side effects - Ibuprofen avoided due to reflux - No use of other anti-inflammatory agents - No use of ice, heat, or topical analgesics  Physical Exam RIGHT SHOULDER INSPECTION: No deformity, swelling, or erythema. Normal alignment and posture. PALPATION: Tender spasm at the right upper trapezius. Non-tender at the rhomboids, subacromial space, and bicipital groove. RANGE OF MOTION: Full and symmetric forward flexion and extension without mechanical limitation. STRENGTH: 5/5 strength with internal and external rotation, painless. NEUROLOGICAL: Sensation intact to light touch in the right upper extremity; no focal motor deficit. SPECIAL TESTS: Positive Hawkins test. Positive glenohumeral load test. Positive Speeds test. OBriens test equivocal. Isolated supraspinatus testing elicits discomfort on the right.  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 Imaging shows joint space narrowing, osteophyte formation, and mild impingement. Osteoarthritis is the primary pain source, tendinitis from altered biomechanics. Conservative management preferred. - Continued meloxicam  for 1-2 weeks as tolerated for anti-inflammatory  effect. - Provided anticipatory guidance on gastrointestinal side effects of meloxicam ,  instructed to contact if these develop. - Sent home exercise program via MyChart; instructed to delay resistance and weight-based exercises until after the New Year and to begin with range of motion activities as tolerated. - Scheduled follow-up for four weeks (January 8th, Thursday at 1:20 PM) to reassess and determine need for further intervention. - Conditional plan for intra-articular corticosteroid injection if insufficient improvement with oral anti-inflammatory therapy or if gastrointestinal side effects occur. - Advised as-needed meloxicam  dosing after initial consistent course if symptoms improve. - Encouraged bilateral shoulder exercises to maintain symmetry. - Provided education on osteoarthritis and tendinitis pathophysiology and rationale for conservative management.  Right upper trapezius muscle spasm Localized right upper trapezius tenderness and spasm secondary to compensatory overuse from altered shoulder mechanics due to osteoarthritis. Contributes to discomfort but not primary pain source. - Prescribed baclofen  muscle relaxant for as-needed use for muscle tightness and spasm, with counseling regarding potential drowsiness. - Advised use primarily at bedtime or with severe symptoms, and to avoid activities requiring alertness if drowsiness occurs.      Relevant Medications   Baclofen  5 MG TABS     Orders & Medications Medications:  Meds ordered this encounter  Medications   Baclofen  5 MG TABS    Sig: Take 1 tablet (5 mg total) by mouth 3 (three) times daily as needed.    Dispense:  30 tablet    Refill:  0   No orders of the defined types were placed in this encounter.    No follow-ups on file.     Selinda JINNY Ku, MD, [facility]   Primary Care Sports Medicine Primary Care and Sports Medicine at [facility]   "

## 2024-03-14 NOTE — Assessment & Plan Note (Signed)
 See additional assessment(s) for plan details.

## 2024-03-19 ENCOUNTER — Other Ambulatory Visit: Payer: Self-pay | Admitting: Internal Medicine

## 2024-03-19 DIAGNOSIS — F3341 Major depressive disorder, recurrent, in partial remission: Secondary | ICD-10-CM

## 2024-03-20 NOTE — Telephone Encounter (Signed)
 Requested Prescriptions  Pending Prescriptions Disp Refills   buPROPion  (WELLBUTRIN  XL) 150 MG 24 hr tablet [Pharmacy Med Name: buPROPion  HCL XL 150 MG TABLET] 270 tablet 1    Sig: TAKE 3 TABLETS BY MOUTH DAILY     Psychiatry: Antidepressants - bupropion  Failed - 03/20/2024  2:30 PM      Failed - AST in normal range and within 360 days    AST  Date Value Ref Range Status  09/09/2023 60 (H) 0 - 40 IU/L Final         Failed - ALT in normal range and within 360 days    ALT  Date Value Ref Range Status  09/09/2023 61 (H) 0 - 44 IU/L Final         Passed - Cr in normal range and within 360 days    Creatinine, Ser  Date Value Ref Range Status  09/09/2023 0.80 0.76 - 1.27 mg/dL Final         Passed - Completed PHQ-2 or PHQ-9 in the last 360 days      Passed - Last BP in normal range    BP Readings from Last 1 Encounters:  03/14/24 130/88         Passed - Valid encounter within last 6 months    Recent Outpatient Visits           6 days ago Osteoarthritis of glenohumeral joint, right   [place] [facility], Selinda PARAS, MD   1 week ago Prediabetes   [facility] Medicine at [facility], Leita DEL, MD   6 months ago Recurrent major depressive disorder, in partial remission   [facility] Primary [facility] & Sports Medicine at [facility], Leita DEL, MD

## 2024-03-29 ENCOUNTER — Ambulatory Visit: Admitting: Family Medicine

## 2024-04-02 ENCOUNTER — Other Ambulatory Visit: Payer: Self-pay

## 2024-04-02 DIAGNOSIS — F3341 Major depressive disorder, recurrent, in partial remission: Secondary | ICD-10-CM

## 2024-04-02 DIAGNOSIS — E1169 Type 2 diabetes mellitus with other specified complication: Secondary | ICD-10-CM

## 2024-04-02 DIAGNOSIS — M5412 Radiculopathy, cervical region: Secondary | ICD-10-CM

## 2024-04-03 MED ORDER — EZETIMIBE 10 MG PO TABS: 10.0000 mg | ORAL_TABLET | Freq: Every day | ORAL | 1 refills | Status: AC

## 2024-04-03 MED ORDER — GABAPENTIN 400 MG PO CAPS: 400.0000 mg | ORAL_CAPSULE | Freq: Two times a day (BID) | ORAL | 0 refills | Status: AC

## 2024-04-03 MED ORDER — VENLAFAXINE HCL ER 75 MG PO CP24: 225.0000 mg | ORAL_CAPSULE | Freq: Every day | ORAL | 1 refills | Status: AC

## 2024-04-04 ENCOUNTER — Other Ambulatory Visit: Payer: Self-pay | Admitting: Internal Medicine

## 2024-04-04 DIAGNOSIS — M5412 Radiculopathy, cervical region: Secondary | ICD-10-CM

## 2024-04-04 DIAGNOSIS — E1169 Type 2 diabetes mellitus with other specified complication: Secondary | ICD-10-CM

## 2024-04-04 DIAGNOSIS — F3341 Major depressive disorder, recurrent, in partial remission: Secondary | ICD-10-CM

## 2024-04-04 NOTE — Telephone Encounter (Signed)
 Copied from CRM [id number]. Topic: Clinical - Prescription Issue >> Apr 04, 2024  9:14 AM Montie POUR wrote: Reason for CRM:  I let Anthony Cohen know his gabapentin  (NEURONTIN ) 400 MG capsule  and venlafaxine  XR (EFFEXOR -XR) 75 MG 24 hr capsule and ezetimibe  (ZETIA ) 10 MG tablet  was sent to [facility] yesterday. Please resend orders to refill to Anthony Cohen that is in his chart. Anthony Cohen is cheaper for medications.

## 2024-04-05 NOTE — Telephone Encounter (Signed)
 All filled 04/03/24- duplicate request Requested Prescriptions  Pending Prescriptions Disp Refills   gabapentin  (NEURONTIN ) 400 MG capsule 180 capsule 0    Sig: Take 1 capsule (400 mg total) by mouth 2 (two) times daily.     Neurology: Anticonvulsants - gabapentin  Passed - 04/05/2024 11:39 AM      Passed - Cr in normal range and within 360 days    Creatinine, Ser  Date Value Ref Range Status  09/09/2023 0.80 0.76 - 1.27 mg/dL Final         Passed - Completed PHQ-2 or PHQ-9 in the last 360 days      Passed - Valid encounter within last 12 months    Recent Outpatient Visits           3 weeks ago Osteoarthritis of glenohumeral joint, right   [place] [facility] at [facility], Selinda PARAS, MD   3 weeks ago Prediabetes   [place] [facility] at [facility], Leita DEL, MD   6 months ago Recurrent major depressive disorder, in partial remission   [place] [facility] at [facility], Leita DEL, MD               venlafaxine  XR (EFFEXOR -XR) 75 MG 24 hr capsule 270 capsule 1    Sig: Take 3 capsules (225 mg total) by mouth daily with breakfast.     Psychiatry: Antidepressants - SNRI - desvenlafaxine & venlafaxine  Failed - 04/05/2024 11:39 AM      Failed - Lipid Panel in normal range within the last 12 months    Cholesterol, Total  Date Value Ref Range Status  09/09/2023 221 (H) 100 - 199 mg/dL Final   LDL Chol Calc (NIH)  Date Value Ref Range Status  09/09/2023 115 (H) 0 - 99 mg/dL Final   HDL  Date Value Ref Range Status  09/09/2023 77 >39 mg/dL Final   Triglycerides  Date Value Ref Range Status  09/09/2023 172 (H) 0 - 149 mg/dL Final         Passed - Cr in normal range and within 360 days    Creatinine, Ser  Date Value Ref Range Status  09/09/2023 0.80 0.76 - 1.27 mg/dL Final         Passed - Completed PHQ-2 or PHQ-9 in the last 360 days      Passed - Last BP in  normal range    BP Readings from Last 1 Encounters:  03/14/24 130/88         Passed - Valid encounter within last 6 months    Recent Outpatient Visits           3 weeks ago Osteoarthritis of glenohumeral joint, right   [place] [facility] at [facility], Selinda PARAS, MD   3 weeks ago Prediabetes   [facility] at [facility], Leita DEL, MD   6 months ago Recurrent major depressive disorder, in partial remission   [place] [facility] at [facility], Leita DEL, MD               ezetimibe  (ZETIA ) 10 MG tablet 90 tablet 1    Sig: Take 1 tablet (10 mg total) by mouth daily.     Cardiovascular:  Antilipid - Sterol Transport Inhibitors Failed - 04/05/2024 11:39 AM      Failed -  AST in normal range and within 360 days    AST  Date Value Ref Range Status  09/09/2023 60 (H) 0 - 40 IU/L Final         Failed - ALT in normal range and within 360 days    ALT  Date Value Ref Range Status  09/09/2023 61 (H) 0 - 44 IU/L Final         Failed - Lipid Panel in normal range within the last 12 months    Cholesterol, Total  Date Value Ref Range Status  09/09/2023 221 (H) 100 - 199 mg/dL Final   LDL Chol Calc (NIH)  Date Value Ref Range Status  09/09/2023 115 (H) 0 - 99 mg/dL Final   HDL  Date Value Ref Range Status  09/09/2023 77 >39 mg/dL Final   Triglycerides  Date Value Ref Range Status  09/09/2023 172 (H) 0 - 149 mg/dL Final         Passed - Patient is not pregnant      Passed - Valid encounter within last 12 months    Recent Outpatient Visits           3 weeks ago Osteoarthritis of glenohumeral joint, right   [place] [facility] at [facility], Selinda PARAS, MD   3 weeks ago Prediabetes   [facility] Primary Care & Sports Medicine at [facility], Leita DEL, MD   6 months ago Recurrent major depressive disorder, in  partial remission   [facility] Care & [facility] Medicine at [facility], Leita DEL, MD

## 2024-06-20 ENCOUNTER — Encounter

## 2024-09-11 ENCOUNTER — Encounter: Admitting: Student
